# Patient Record
Sex: Male | Born: 1980
Health system: Southern US, Community
[De-identification: ages and names within clinical notes are randomized; demographics above are authoritative.]

## PROBLEM LIST (undated history)

## (undated) ENCOUNTER — Ambulatory Visit: Discharge status: Absent without leave | Payer: Self-pay

## (undated) DIAGNOSIS — E785 Hyperlipidemia, unspecified: Secondary | ICD-10-CM

## (undated) DIAGNOSIS — R079 Chest pain, unspecified: Secondary | ICD-10-CM

## (undated) DIAGNOSIS — E669 Obesity, unspecified: Secondary | ICD-10-CM

## (undated) HISTORY — PX: EYE SURGERY: SHX253

## (undated) HISTORY — PX: BACK SURGERY: SHX140

## (undated) HISTORY — DX: Chest pain, unspecified: R07.9

## (undated) HISTORY — DX: Obesity, unspecified: E66.9

## (undated) HISTORY — DX: Hyperlipidemia, unspecified: E78.5

---

## 2010-08-01 ENCOUNTER — Ambulatory Visit: Payer: Self-pay | Admitting: Internal Medicine

## 2012-05-27 ENCOUNTER — Ambulatory Visit: Payer: Self-pay

## 2012-07-07 ENCOUNTER — Ambulatory Visit: Payer: Self-pay

## 2014-08-15 DIAGNOSIS — K644 Residual hemorrhoidal skin tags: Secondary | ICD-10-CM | POA: Insufficient documentation

## 2014-08-15 DIAGNOSIS — R739 Hyperglycemia, unspecified: Secondary | ICD-10-CM | POA: Insufficient documentation

## 2014-08-15 DIAGNOSIS — R011 Cardiac murmur, unspecified: Secondary | ICD-10-CM | POA: Insufficient documentation

## 2014-08-15 DIAGNOSIS — K5900 Constipation, unspecified: Secondary | ICD-10-CM | POA: Insufficient documentation

## 2014-08-15 DIAGNOSIS — E349 Endocrine disorder, unspecified: Secondary | ICD-10-CM | POA: Insufficient documentation

## 2014-08-15 DIAGNOSIS — R799 Abnormal finding of blood chemistry, unspecified: Secondary | ICD-10-CM | POA: Insufficient documentation

## 2014-08-15 DIAGNOSIS — K219 Gastro-esophageal reflux disease without esophagitis: Secondary | ICD-10-CM | POA: Insufficient documentation

## 2014-08-15 DIAGNOSIS — E7800 Pure hypercholesterolemia, unspecified: Secondary | ICD-10-CM | POA: Insufficient documentation

## 2014-08-15 DIAGNOSIS — R002 Palpitations: Secondary | ICD-10-CM | POA: Insufficient documentation

## 2014-08-15 DIAGNOSIS — R7303 Prediabetes: Secondary | ICD-10-CM | POA: Insufficient documentation

## 2014-08-15 DIAGNOSIS — E559 Vitamin D deficiency, unspecified: Secondary | ICD-10-CM | POA: Insufficient documentation

## 2014-09-18 ENCOUNTER — Ambulatory Visit
Admission: EM | Admit: 2014-09-18 | Discharge: 2014-09-18 | Disposition: A | Discharge status: Home / Self Care | Payer: Worker's Compensation

## 2014-09-20 HISTORY — PX: LASIK: SHX215

## 2014-09-24 ENCOUNTER — Encounter: Payer: Self-pay | Admitting: Physician Assistant

## 2014-09-24 ENCOUNTER — Ambulatory Visit (INDEPENDENT_AMBULATORY_CARE_PROVIDER_SITE_OTHER): Payer: 59 | Admitting: Physician Assistant

## 2014-09-24 VITALS — BP 130/86 | HR 92 | Temp 98.4°F | Resp 16 | Ht 71.0 in | Wt 216.0 lb

## 2014-09-24 DIAGNOSIS — Z0000 Encounter for general adult medical examination without abnormal findings: Secondary | ICD-10-CM

## 2014-09-24 DIAGNOSIS — E291 Testicular hypofunction: Secondary | ICD-10-CM | POA: Diagnosis not present

## 2014-09-24 DIAGNOSIS — E349 Endocrine disorder, unspecified: Secondary | ICD-10-CM

## 2014-09-24 DIAGNOSIS — R0989 Other specified symptoms and signs involving the circulatory and respiratory systems: Secondary | ICD-10-CM

## 2014-09-24 NOTE — Patient Instructions (Signed)
Health Maintenance A healthy lifestyle and preventative care can promote health and wellness. 1. Maintain regular health, dental, and eye exams. 2. Eat a healthy diet. Foods like vegetables, fruits, whole grains, low-fat dairy products, and lean protein foods contain the nutrients you need and are low in calories. Decrease your intake of foods high in solid fats, added sugars, and salt. Get information about a proper diet from your health care provider, if necessary. 3. Regular physical exercise is one of the most important things you can do for your health. Most adults should get at least 150 minutes of moderate-intensity exercise (any activity that increases your heart rate and causes you to sweat) each week. In addition, most adults need muscle-strengthening exercises on 2 or more days a week.  4. Maintain a healthy weight. The body mass index (BMI) is a screening tool to identify possible weight problems. It provides an estimate of body fat based on height and weight. Your health care provider can find your BMI and can help you achieve or maintain a healthy weight. For males 20 years and older: 1. A BMI below 18.5 is considered underweight. 2. A BMI of 18.5 to 24.9 is normal. 3. A BMI of 25 to 29.9 is considered overweight. 4. A BMI of 30 and above is considered obese. 5. Maintain normal blood lipids and cholesterol by exercising and minimizing your intake of saturated fat. Eat a balanced diet with plenty of fruits and vegetables. Blood tests for lipids and cholesterol should begin at age 32 and be repeated every 5 years. If your lipid or cholesterol levels are high, you are over age 35, or you are at high risk for heart disease, you may need your cholesterol levels checked more frequently.Ongoing high lipid and cholesterol levels should be treated with medicines if diet and exercise are not working. 6. If you smoke, find out from your health care provider how to quit. If you do not use tobacco,  do not start. 7. Lung cancer screening is recommended for adults aged 55-80 years who are at high risk for developing lung cancer because of a history of smoking. A yearly low-dose CT scan of the lungs is recommended for people who have at least a 30-pack-year history of smoking and are current smokers or have quit within the past 15 years. A pack year of smoking is smoking an average of 1 pack of cigarettes a day for 1 year (for example, a 30-pack-year history of smoking could mean smoking 1 pack a day for 30 years or 2 packs a day for 15 years). Yearly screening should continue until the smoker has stopped smoking for at least 15 years. Yearly screening should be stopped for people who develop a health problem that would prevent them from having lung cancer treatment. 8. If you choose to drink alcohol, do not have more than 2 drinks per day. One drink is considered to be 12 oz (360 mL) of beer, 5 oz (150 mL) of wine, or 1.5 oz (45 mL) of liquor. 9. Avoid the use of street drugs. Do not share needles with anyone. Ask for help if you need support or instructions about stopping the use of drugs. 10. High blood pressure causes heart disease and increases the risk of stroke. Blood pressure should be checked at least every 1-2 years. Ongoing high blood pressure should be treated with medicines if weight loss and exercise are not effective. 11. If you are 94-12 years old, ask your health care provider if  you should take aspirin to prevent heart disease. 12. Diabetes screening involves taking a blood sample to check your fasting blood sugar level. This should be done once every 3 years after age 18 if you are at a normal weight and without risk factors for diabetes. Testing should be considered at a younger age or be carried out more frequently if you are overweight and have at least 1 risk factor for diabetes. 13. Colorectal cancer can be detected and often prevented. Most routine colorectal cancer screening  begins at the age of 64 and continues through age 18. However, your health care provider may recommend screening at an earlier age if you have risk factors for colon cancer. On a yearly basis, your health care provider may provide home test kits to check for hidden blood in the stool. A small camera at the end of a tube may be used to directly examine the colon (sigmoidoscopy or colonoscopy) to detect the earliest forms of colorectal cancer. Talk to your health care provider about this at age 72 when routine screening begins. A direct exam of the colon should be repeated every 5-10 years through age 12, unless early forms of precancerous polyps or small growths are found. 14. People who are at an increased risk for hepatitis B should be screened for this virus. You are considered at high risk for hepatitis B if: 1. You were born in a country where hepatitis B occurs often. Talk with your health care provider about which countries are considered high risk. 2. Your parents were born in a high-risk country and you have not received a shot to protect against hepatitis B (hepatitis B vaccine). 3. You have HIV or AIDS. 4. You use needles to inject street drugs. 5. You live with, or have sex with, someone who has hepatitis B. 6. You are a man who has sex with other men (MSM). 7. You get hemodialysis treatment. 8. You take certain medicines for conditions like cancer, organ transplantation, and autoimmune conditions. 15. Hepatitis C blood testing is recommended for all people born from [birth year] through 1964 and any individual with known risk factors for hepatitis C. 16. Healthy men should no longer receive prostate-specific antigen (PSA) blood tests as part of routine cancer screening. Talk to your health care provider about prostate cancer screening. 17. Testicular cancer screening is not recommended for adolescents or adult males who have no symptoms. Screening includes self-exam, a health care provider exam, and  other screening tests. Consult with your health care provider about any symptoms you have or any concerns you have about testicular cancer. 18. Practice safe sex. Use condoms and avoid high-risk sexual practices to reduce the spread of sexually transmitted infections (STIs). 19. You should be screened for STIs, including gonorrhea and chlamydia if: 1. You are sexually active and are younger than 24 years. 2. You are older than 24 years, and your health care provider tells you that you are at risk for this type of infection. 3. Your sexual activity has changed since you were last screened, and you are at an increased risk for chlamydia or gonorrhea. Ask your health care provider if you are at risk. 20. If you are at risk of being infected with HIV, it is recommended that you take a prescription medicine daily to prevent HIV infection. This is called pre-exposure prophylaxis (PrEP). You are considered at risk if: 1. You are a man who has sex with other men (MSM). 2. You are a heterosexual man who  is sexually active with multiple partners. 3. You take drugs by injection. 4. You are sexually active with a partner who has HIV. 5. Talk with your health care provider about whether you are at high risk of being infected with HIV. If you choose to begin PrEP, you should first be tested for HIV. You should then be tested every 3 months for as long as you are taking PrEP. 21. Use sunscreen. Apply sunscreen liberally and repeatedly throughout the day. You should seek shade when your shadow is shorter than you. Protect yourself by wearing long sleeves, pants, a wide-brimmed hat, and sunglasses year round whenever you are outdoors. 22. Tell your health care provider of new moles or changes in moles, especially if there is a change in shape or color. Also, tell your health care provider if a mole is larger than the size of a pencil eraser. 23. A one-time screening for abdominal aortic aneurysm (AAA) and surgical  repair of large AAAs by ultrasound is recommended for men aged 65-75 years who are current or former smokers. 24. Stay current with your vaccines (immunizations). Document Released: 08/15/2007 Document Revised: 02/21/2013 Document Reviewed: 07/14/2010 [facility] Patient Information 2015 [place], [place]. This information is not intended to replace advice given to you by your health care provider. Make sure you discuss any questions you have with your health care provider.     Why follow it? Research shows. . Those who follow the Mediterranean diet have a reduced risk of heart disease  . The diet is associated with a reduced incidence of Parkinson's and Alzheimer's diseases . People following the diet may have longer life expectancies and lower rates of chronic diseases  . The Dietary Guidelines for Americans recommends the Mediterranean diet as an eating plan to promote health and prevent disease  What Is the Mediterranean Diet?  . Healthy eating plan based on typical foods and recipes of Mediterranean-style cooking . The diet is primarily a plant based diet; these foods should make up a majority of meals   Starches - Plant based foods should make up a majority of meals - They are an important sources of vitamins, minerals, energy, antioxidants, and fiber - Choose whole grains, foods high in fiber and minimally processed items  - Typical grain sources include wheat, oats, barley, corn, brown rice, bulgar, farro, millet, polenta, couscous  - Various types of beans include chickpeas, lentils, fava beans, black beans, white beans   Fruits  Veggies - Large quantities of antioxidant rich fruits & veggies; 6 or more servings  - Vegetables can be eaten raw or lightly drizzled with oil and cooked  - Vegetables common to the traditional Mediterranean Diet include: artichokes, arugula, beets, broccoli, brussel sprouts, cabbage, carrots, celery, collard greens, cucumbers, eggplant, kale, leeks, lemons,  lettuce, mushrooms, okra, onions, peas, peppers, potatoes, pumpkin, radishes, rutabaga, shallots, spinach, sweet potatoes, turnips, zucchini - Fruits common to the Mediterranean Diet include: apples, apricots, avocados, cherries, clementines, dates, figs, grapefruits, grapes, melons, nectarines, oranges, peaches, pears, pomegranates, strawberries, tangerines  Fats - Replace butter and margarine with healthy oils, such as olive oil, canola oil, and tahini  - Limit nuts to no more than a handful a day  - Nuts include walnuts, almonds, pecans, pistachios, pine nuts  - Limit or avoid candied, honey roasted or heavily salted nuts - Olives are central to the Mediterranean diet - can be eaten whole or used in a variety of dishes   Meats Protein - Limiting red meat: no more  than a few times a month - When eating red meat: choose lean cuts and keep the portion to the size of deck of cards - Eggs: approx. 0 to 4 times a week  - Fish and lean poultry: at least 2 a week  - Healthy protein sources include, chicken, [place], lean beef, lamb - Increase intake of seafood such as tuna, salmon, trout, mackerel, shrimp, scallops - Avoid or limit high fat processed meats such as sausage and bacon  Dairy - Include moderate amounts of low fat dairy products  - Focus on healthy dairy such as fat free yogurt, skim milk, low or reduced fat cheese - Limit dairy products higher in fat such as whole or 2% milk, cheese, ice cream  Alcohol - Moderate amounts of red wine is ok  - No more than 5 oz daily for women (all ages) and men older than age 35  - No more than 10 oz of wine daily for men younger than 83  Other - Limit sweets and other desserts  - Use herbs and spices instead of salt to flavor foods  - Herbs and spices common to the traditional Mediterranean Diet include: basil, bay leaves, chives, cloves, cumin, fennel, garlic, lavender, marjoram, mint, oregano, parsley, pepper, rosemary, sage, savory, sumac, tarragon,  thyme   It's not just a diet, it's a lifestyle:  . The Mediterranean diet includes lifestyle factors typical of those in the region  . Foods, drinks and meals are best eaten with others and savored . Daily physical activity is important for overall good health . This could be strenuous exercise like running and aerobics . This could also be more leisurely activities such as walking, housework, yard-work, or taking the stairs . Moderation is the key; a balanced and healthy diet accommodates most foods and drinks . Consider portion sizes and frequency of consumption of certain foods   Meal Ideas & Options:  . Breakfast:  o Whole wheat toast or whole wheat English muffins with peanut butter & hard boiled egg o Steel cut oats topped with apples & cinnamon and skim milk  o Fresh fruit: banana, strawberries, melon, berries, peaches  o Smoothies: strawberries, bananas, greek yogurt, peanut butter o Low fat greek yogurt with blueberries and granola  o Egg white omelet with spinach and mushrooms o Breakfast couscous: whole wheat couscous, apricots, skim milk, cranberries  . Sandwiches:  o Hummus and grilled vegetables (peppers, zucchini, squash) on whole wheat bread   o Grilled chicken on whole wheat pita with lettuce, tomatoes, cucumbers or tzatziki  o Tuna salad on whole wheat bread: tuna salad made with greek yogurt, olives, red peppers, capers, green onions o Garlic rosemary lamb pita: lamb sauted with garlic, rosemary, salt & pepper; add lettuce, cucumber, greek yogurt to pita - flavor with lemon juice and black pepper  . Seafood:  o Mediterranean grilled salmon, seasoned with garlic, basil, parsley, lemon juice and black pepper o Shrimp, lemon, and spinach whole-grain pasta salad made with low fat greek yogurt  o Seared scallops with lemon orzo  o Seared tuna steaks seasoned salt, pepper, coriander topped with tomato mixture of olives, tomatoes, olive oil, minced garlic, parsley, green  onions and cappers  . Meats:  o Herbed greek chicken salad with kalamata olives, cucumber, feta  o Red bell peppers stuffed with spinach, bulgur, lean ground beef (or lentils) & topped with feta   o Kebabs: skewers of chicken, tomatoes, onions, zucchini, squash  o [place] burgers: made with  red onions, mint, dill, lemon juice, feta cheese topped with roasted red peppers . Vegetarian o Cucumber salad: cucumbers, artichoke hearts, celery, red onion, feta cheese, tossed in olive oil & lemon juice  o Hummus and whole grain pita points with a greek salad (lettuce, tomato, feta, olives, cucumbers, red onion) o Lentil soup with celery, carrots made with vegetable broth, garlic, salt and pepper  o Tabouli salad: parsley, bulgur, mint, scallions, cucumbers, tomato, radishes, lemon juice, olive oil, salt and pepper.      American Heart Association (AHA) Exercise Recommendation  Being physically active is important to prevent heart disease and stroke, the nation's No. 1and No. 5killers. To improve overall cardiovascular health, we suggest at least 150 minutes per week of moderate exercise or 75 minutes per week of vigorous exercise (or a combination of moderate and vigorous activity). Thirty minutes a day, five times a week is an easy goal to remember. You will also experience benefits even if you divide your time into two or three segments of 10 to 15 minutes per day.  For people who would benefit from lowering their blood pressure or cholesterol, we recommend 40 minutes of aerobic exercise of moderate to vigorous intensity three to four times a week to lower the risk for heart attack and stroke.  Physical activity is anything that makes you move your body and burn calories.  This includes things like climbing stairs or playing sports. Aerobic exercises benefit your heart, and include walking, jogging, swimming or biking. Strength and stretching exercises are best for overall stamina and flexibility.  The  simplest, positive change you can make to effectively improve your heart health is to start walking. It's enjoyable, free, easy, social and great exercise. A walking program is flexible and boasts high success rates because people can stick with it. It's easy for walking to become a regular and satisfying part of life.   For Overall Cardiovascular Health:  At least 30 minutes of moderate-intensity aerobic activity at least 5 days per week for a total of 150  OR   At least 25 minutes of vigorous aerobic activity at least 3 days per week for a total of 75 minutes; or a combination of moderate- and vigorous-intensity aerobic activity  AND   Moderate- to high-intensity muscle-strengthening activity at least 2 days per week for additional health benefits.  For Lowering Blood Pressure and Cholesterol  An average 40 minutes of moderate- to vigorous-intensity aerobic activity 3 or 4 times per week  What if I can't make it to the time goal? Something is always better than nothing! And everyone has to start somewhere. Even if you've been sedentary for years, today is the day you can begin to make healthy changes in your life. If you don't think you'll make it for 30 or 40 minutes, set a reachable goal for today. You can work up toward your overall goal by increasing your time as you get stronger. Don't let all-or-nothing thinking rob you of doing what you can every day.  Source:http://www.heart.org

## 2014-09-24 NOTE — Progress Notes (Signed)
Patient: Eric Lane, Male    DOB: Mar 03, 1980, 34 y.o.   MRN: [id number] Visit Date: 09/24/2014  Today's Provider: Margaretann Loveless, PA-C   Chief Complaint  Patient presents with  . Annual Exam  . Hypogonadism   Subjective:    Annual physical exam Eric Lane is a 34 y.o. male who presents today for health maintenance and complete physical. He feels well. 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 Pt's Last CPE- 09/20/2013 Last Tdap- 2015 before the birth of his son at [facility] in [place], [place] ----------------------------------------------------------------- Hypogonadism Pt reports he D/C his Testosterone about 5 months ago, and is requesting to be checked for any residual abnormalities from D/C the testosterone.  He is followed by Dr. Sammuel Bailiff for this and she is to follow up with him and recheck testosterone levels in October 2016.  He has had a complete workup for a cause including head CT to evaluate pituitary gland for possible tumor, which was negative.  He feels he is doing ok without the testosterone injection and has not had any worsening of his fatigue or decrease in libido.  He also reports he had some blood work taken with work and they told him his cholesterol, glucose, and blood counts were all ok and WNL.  Review of Systems  All other systems reviewed and are negative.   Social History He  reports that he quit smoking about 9 years ago. His smoking use included Cigarettes. He has a 5 pack-year smoking history. His smokeless tobacco use includes Chew. He reports that he drinks alcohol. He reports that he does not use illicit drugs.  Patient Active Problem List   Diagnosis Date Noted  . Abnormal blood chemistry 08/15/2014  . CN (constipation) 08/15/2014  . Elevated blood sugar 08/15/2014  . Brash 08/15/2014  . External hemorrhoid 08/15/2014  . Hypercholesteremia 08/15/2014  . Hypotestosteronism 08/15/2014  .  Cardiac murmur 08/15/2014  . Awareness of heartbeats 08/15/2014  . Borderline diabetes 08/15/2014  . Avitaminosis D 08/15/2014    Past Surgical History  Procedure Laterality Date  . Lasik Bilateral 09/20/2014    Family History His family history includes Alzheimer's disease in his paternal grandmother; COPD in his maternal grandmother; Cancer in his maternal grandfather; Congestive Heart Failure in his maternal grandmother; Diabetes in his maternal grandmother; Hyperlipidemia in his father; Hypertension in his mother; Stomach cancer in his paternal grandfather.    No Known Allergies  Previous Medications   MOMETASONE-FORMOTEROL (DULERA) 200-5 MCG/ACT AERO    Inhale 2 puffs into the lungs 2 (two) times daily.   RANITIDINE (ZANTAC) 150 MG TABLET    Take 2 tablets by mouth as needed.    Patient Care Team: Margaretann Loveless, PA-C as PCP - General (Physician Assistant)     Objective:   Vitals: BP 130/86 mmHg  Pulse 92  Temp(Src) 98.4 F (36.9 C) (Oral)  Resp 16  Ht  (1.803 m)  Wt 216 lb (97.977 kg)  BMI 30.14 kg/m2   Physical Exam  Constitutional: He is oriented to person, place, and time. He appears well-developed and well-nourished.  HENT:  Head: Normocephalic and atraumatic.  Right Ear: Hearing, tympanic membrane, external ear and ear canal normal.  Left Ear: Hearing, tympanic membrane, external ear and ear canal normal.  Nose: Nose normal.  Mouth/Throat: Uvula is midline, oropharynx is clear and moist and mucous membranes are normal. No oropharyngeal exudate.  Eyes:  Conjunctivae and EOM are normal. Pupils are equal, round, and reactive to light. Right eye exhibits no discharge.  Neck: Normal range of motion. Neck supple. Carotid bruit is not present. No tracheal deviation present. No thyromegaly present.  Cardiovascular: Normal rate, regular rhythm, normal heart sounds and intact distal pulses.  Exam reveals no gallop and no friction rub.   No murmur  heard. Pulmonary/Chest: Effort normal and breath sounds normal. No respiratory distress. He has no wheezes. He has no rales. He exhibits no tenderness.  Abdominal: Soft. Bowel sounds are normal. He exhibits abdominal bruit and pulsatile midline mass. He exhibits no distension and no mass. There is no hepatosplenomegaly. There is no tenderness. There is no rebound, no guarding and no CVA tenderness. No hernia.  Musculoskeletal: Normal range of motion. He exhibits no edema or tenderness.  Lymphadenopathy:    He has no cervical adenopathy.  Neurological: He is alert and oriented to person, place, and time. He has normal reflexes. No cranial nerve deficit. He exhibits normal muscle tone. Coordination normal.  Skin: Skin is warm and dry. No rash noted. No erythema.  Psychiatric: He has a normal mood and affect. His behavior is normal. Judgment and thought content normal.  Vitals reviewed.    Depression Screen No flowsheet data found.    Assessment & Plan:     Routine Health Maintenance and Physical Exam  Exercise Activities and Dietary recommendations Goals    None       There is no immunization history on file for this patient.  Health Maintenance  Topic Date Due  . HIV Screening  02/11/1997  . TETANUS/TDAP  02/11/2001  . INFLUENZA VACCINE  10/01/2014      Discussed health benefits of physical activity, and encouraged him to engage in regular exercise appropriate for his age and condition.   1. Routine general medical examination at a health care facility Labs recently done with work and were reported normal.    2. Prominent abdominal aortic pulsation No family history of AAA.  Will check aortic US to make sure no AAA. - US Aorta; Future  3. Hypotestosteronism Currently not on testosterone supplement.  Followed by Dr. Sammuel Bailiff.  She is to recheck testosterone in October 2016.    --------------------------------------------------------------------

## 2014-09-25 ENCOUNTER — Telehealth: Payer: Self-pay | Admitting: Physician Assistant

## 2014-09-25 NOTE — Telephone Encounter (Signed)
This has been changed.  Thank you! -JB

## 2014-09-25 NOTE — Addendum Note (Signed)
Addended by: Margaretann Loveless on: 09/25/2014 08:51 AM   Modules accepted: Orders

## 2014-09-25 NOTE — Telephone Encounter (Signed)
Per [facility] Ultrasound of Aorta needs to be changed to  Ultrasound Retroperitoneal complete [id number] for diagnosis given,Thanks

## 2014-09-28 ENCOUNTER — Ambulatory Visit
Admission: RE | Admit: 2014-09-28 | Discharge: 2014-09-28 | Disposition: A | Discharge status: Home / Self Care | Payer: 59 | Source: Ambulatory Visit | Attending: Physician Assistant | Admitting: Physician Assistant

## 2014-09-28 DIAGNOSIS — R0989 Other specified symptoms and signs involving the circulatory and respiratory systems: Secondary | ICD-10-CM | POA: Diagnosis not present

## 2014-12-07 ENCOUNTER — Encounter: Payer: Self-pay | Admitting: Physician Assistant

## 2014-12-07 ENCOUNTER — Ambulatory Visit (INDEPENDENT_AMBULATORY_CARE_PROVIDER_SITE_OTHER): Payer: 59 | Admitting: Physician Assistant

## 2014-12-07 VITALS — BP 118/80 | HR 68 | Temp 97.6°F | Resp 16 | Wt 210.4 lb

## 2014-12-07 DIAGNOSIS — R059 Cough, unspecified: Secondary | ICD-10-CM

## 2014-12-07 DIAGNOSIS — R05 Cough: Secondary | ICD-10-CM

## 2014-12-07 DIAGNOSIS — J0100 Acute maxillary sinusitis, unspecified: Secondary | ICD-10-CM | POA: Diagnosis not present

## 2014-12-07 MED ORDER — AMOXICILLIN-POT CLAVULANATE 875-125 MG PO TABS: 1.0000 | ORAL_TABLET | Freq: Two times a day (BID) | ORAL | Status: DC

## 2014-12-07 MED ORDER — BENZONATATE 100 MG PO CAPS: 100.0000 mg | ORAL_CAPSULE | Freq: Three times a day (TID) | ORAL | Status: DC | PRN

## 2014-12-07 NOTE — Progress Notes (Signed)
Patient: Eric Lane Male    DOB: 13-Jun-1980   34 y.o.   MRN: [id number] Visit Date: 12/07/2014  Today's Provider: Margaretann Loveless, PA-C   Chief Complaint  Patient presents with  . Cough   Subjective:    Cough This is a new problem. The current episode started more than 1 month ago. The problem has been gradually worsening. The problem occurs every few hours. The cough is productive of sputum (yellowish and greenisg pleghm). Associated symptoms include postnasal drip. Pertinent negatives include no chest pain, ear congestion, fever, headaches, nasal congestion, rhinorrhea, sore throat, shortness of breath or wheezing. Exacerbated by: Worst at night. He has tried OTC cough suppressant (allergy medicines) for the symptoms. The treatment provided no relief.  He has been trying Flonase, Benadryl, DayQuil and NyQuil and his Dulera inhaler. He has not had much relief from this. He states that the symptoms initially started near the end of August while he was away at a bachelor party. He called the doctor on call with his employer and was told that it was most likely viral in nature. He did symptomatic treatment at that time and symptoms improved. He then states that around mid September the symptoms came back and were worse this time. He states that he started developing the postnasal drainage which started the cough. He is now coughing up thick yellowish-green mucus. Cough is most bothersome when he initially lays down at night. This is not inhibiting his sleep however.     No Known Allergies Previous Medications   FLUTICASONE (FLONASE) 50 MCG/ACT NASAL SPRAY    Place 2 sprays into both nostrils daily.   MOMETASONE-FORMOTEROL (DULERA) 200-5 MCG/ACT AERO    Inhale 2 puffs into the lungs 2 (two) times daily.   RANITIDINE (ZANTAC) 150 MG TABLET    Take 2 tablets by mouth as needed.    Review of Systems  Constitutional: Negative for fever.  HENT: Positive for congestion and postnasal  drip. Negative for rhinorrhea, sinus pressure, sneezing and sore throat.   Eyes: Negative for photophobia, itching and visual disturbance.  Respiratory: Positive for cough. Negative for chest tightness, shortness of breath and wheezing.   Cardiovascular: Negative for chest pain and palpitations.  Neurological: Negative for headaches.    Social History  Substance Use Topics  . Smoking status: Former Smoker -- 1.00 packs/day for 5 years    Types: Cigarettes    Quit date: 03/01/2005  . Smokeless tobacco: Current User    Types: Chew  . Alcohol Use: 0.0 oz/week    0 Standard drinks or equivalent per week     Comment: OCCASIONALLY   Objective:   BP 118/80 mmHg  Pulse 68  Temp(Src) 97.6 F (36.4 C) (Oral)  Resp 16  Wt 210 lb 6.4 oz (95.437 kg)  SpO2 100%  Physical Exam  Constitutional: He appears well-developed and well-nourished. No distress.  HENT:  Head: Normocephalic and atraumatic.  Right Ear: Hearing, tympanic membrane, external ear and ear canal normal.  Left Ear: Hearing, tympanic membrane, external ear and ear canal normal.  Nose: Mucosal edema and rhinorrhea present. Right sinus exhibits maxillary sinus tenderness. Right sinus exhibits no frontal sinus tenderness. Left sinus exhibits maxillary sinus tenderness. Left sinus exhibits no frontal sinus tenderness.  Mouth/Throat: Uvula is midline, oropharynx is clear and moist and mucous membranes are normal. No oropharyngeal exudate.  Eyes: Conjunctivae and EOM are normal. Pupils are equal, round, and reactive to light. Right eye exhibits  no discharge. Left eye exhibits no discharge.  Neck: Normal range of motion. Neck supple. No JVD present. No tracheal deviation present. No Brudzinski's sign and no Kernig's sign noted. No thyromegaly present.  Cardiovascular: Normal rate, regular rhythm and normal heart sounds.  Exam reveals no gallop and no friction rub.   No murmur heard. Pulmonary/Chest: Effort normal and breath sounds  normal. No stridor. No respiratory distress. He has no wheezes. He has no rales. He exhibits no tenderness.  Lymphadenopathy:    He has no cervical adenopathy.  Skin: Skin is warm and dry. He is not diaphoretic.  Vitals reviewed.       Assessment & Plan:     1. Acute maxillary sinusitis, recurrence not specified  Worsening since September. Failed over-the-counter symptomatic treatments. I will treat with Augmentin as below. He is to call the office if symptoms fail to improve or worsen. - amoxicillin-clavulanate (AUGMENTIN) 875-125 MG tablet; Take 1 tablet by mouth 2 (two) times daily.  Dispense: 14 tablet; Refill: 0  2. Cough  I will treat the cough with Tessalon Perles as below. He did have this medication before and it worked well. I will avoid narcotic cough medication at this time due to the fact that he is not having any difficulties with sleep. He is to call the office if symptoms fail to improve or persist. - benzonatate (TESSALON PERLES) 100 MG capsule; Take 1 capsule (100 mg total) by mouth 3 (three) times daily as needed for cough.  Dispense: 20 capsule; Refill: 0       Margaretann Loveless, PA-C  [facility]

## 2014-12-07 NOTE — Patient Instructions (Signed)
Cough, Adult Coughing is a reflex that clears your throat and your airways. Coughing helps to heal and protect your lungs. It is normal to cough occasionally, but a cough that happens with other symptoms or lasts a long time may be a sign of a condition that needs treatment. A cough may last only 2-3 weeks (acute), or it may last longer than 8 weeks (chronic). CAUSES Coughing is commonly caused by:  Breathing in substances that irritate your lungs.  A viral or bacterial respiratory infection.  Allergies.  Asthma.  Postnasal drip.  Smoking.  Acid backing up from the stomach into the esophagus (gastroesophageal reflux).  Certain medicines.  Chronic lung problems, including COPD (or rarely, lung cancer).  Other medical conditions such as heart failure. HOME CARE INSTRUCTIONS  Pay attention to any changes in your symptoms. Take these actions to help with your discomfort:  Take medicines only as told by your health care provider.  If you were prescribed an antibiotic medicine, take it as told by your health care provider. Do not stop taking the antibiotic even if you start to feel better.  Talk with your health care provider before you take a cough suppressant medicine.  Drink enough fluid to keep your urine clear or pale yellow.  If the air is dry, use a cold steam vaporizer or humidifier in your bedroom or your home to help loosen secretions.  Avoid anything that causes you to cough at work or at home.  If your cough is worse at night, try sleeping in a semi-upright position.  Avoid cigarette smoke. If you smoke, quit smoking. If you need help quitting, ask your health care provider.  Avoid caffeine.  Avoid alcohol.  Rest as needed. SEEK MEDICAL CARE IF:   You have new symptoms.  You cough up pus.  Your cough does not get better after 2-3 weeks, or your cough gets worse.  You cannot control your cough with suppressant medicines and you are losing sleep.  You  develop pain that is getting worse or pain that is not controlled with pain medicines.  You have a fever.  You have unexplained weight loss.  You have night sweats. SEEK IMMEDIATE MEDICAL CARE IF:  You cough up blood.  You have difficulty breathing.  Your heartbeat is very fast.   This information is not intended to replace advice given to you by your health care provider. Make sure you discuss any questions you have with your health care provider.   Document Released: 08/15/2010 Document Revised: 11/07/2014 Document Reviewed: 04/25/2014 Elsevier Interactive Patient Education 2016 Elsevier Inc.  Benzonatate capsules What is this medicine? BENZONATATE (ben ZOE na tate) is used to treat cough. This medicine may be used for other purposes; ask your health care provider or pharmacist if you have questions. What should I tell my health care provider before I take this medicine? They need to know if you have any of these conditions: -kidney or liver disease -an unusual or allergic reaction to benzonatate, anesthetics, other medicines, foods, dyes, or preservatives -pregnant or trying to get pregnant -breast-feeding How should I use this medicine? Take this medicine by mouth with a glass of water. Follow the directions on the prescription label. Avoid breaking, chewing, or sucking the capsule, as this can cause serious side effects. Take your medicine at regular intervals. Do not take your medicine more often than directed. Talk to your pediatrician regarding the use of this medicine in children. While this drug may be prescribed for  children as young as 40 years old for selected conditions, precautions do apply. Overdosage: If you think you have taken too much of this medicine contact a poison control center or emergency room at once. NOTE: This medicine is only for you. Do not share this medicine with others. What if I miss a dose? If you miss a dose, take it as soon as you can. If it  is almost time for your next dose, take only that dose. Do not take double or extra doses. What may interact with this medicine? Do not take this medicine with any of the following medications: -MAOIs like Carbex, Eldepryl, Marplan, Nardil, and Parnate This list may not describe all possible interactions. Give your health care provider a list of all the medicines, herbs, non-prescription drugs, or dietary supplements you use. Also tell them if you smoke, drink alcohol, or use illegal drugs. Some items may interact with your medicine. What should I watch for while using this medicine? Tell your doctor if your symptoms do not improve or if they get worse. If you have a high fever, skin rash, or headache, see your health care professional. You may get drowsy or dizzy. Do not drive, use machinery, or do anything that needs mental alertness until you know how this medicine affects you. Do not sit or stand up quickly, especially if you are an older patient. This reduces the risk of dizzy or fainting spells. What side effects may I notice from receiving this medicine? Side effects that you should report to your doctor or health care professional as soon as possible: -allergic reactions like skin rash, itching or hives, swelling of the face, lips, or tongue -breathing problems -chest pain -confusion or hallucinations -irregular heartbeat -numbness of mouth or throat -seizures Side effects that usually do not require medical attention (report to your doctor or health care professional if they continue or are bothersome): -burning feeling in the eyes -constipation -headache -nasal congestion -stomach upset This list may not describe all possible side effects. Call your doctor for medical advice about side effects. You may report side effects to FDA at 1-800-FDA-1088. Where should I keep my medicine? Keep out of the reach of children. Store at room temperature between 15 and 30 degrees C (59 and 86  degrees F). Keep tightly closed. Protect from light and moisture. Throw away any unused medicine after the expiration date. NOTE: This sheet is a summary. It may not cover all possible information. If you have questions about this medicine, talk to your doctor, pharmacist, or health care provider.    2016, Elsevier/Gold Standard. (2007-05-18 14:52:56)

## 2015-03-06 ENCOUNTER — Encounter: Payer: Self-pay | Admitting: Physician Assistant

## 2015-03-06 ENCOUNTER — Ambulatory Visit (INDEPENDENT_AMBULATORY_CARE_PROVIDER_SITE_OTHER): Payer: 59 | Admitting: Physician Assistant

## 2015-03-06 VITALS — BP 120/80 | HR 74 | Temp 98.5°F | Resp 16 | Wt 220.0 lb

## 2015-03-06 DIAGNOSIS — J0100 Acute maxillary sinusitis, unspecified: Secondary | ICD-10-CM

## 2015-03-06 MED ORDER — AMOXICILLIN-POT CLAVULANATE 875-125 MG PO TABS: 1.0000 | ORAL_TABLET | Freq: Two times a day (BID) | ORAL | Status: DC

## 2015-03-06 NOTE — Patient Instructions (Signed)

## 2015-03-06 NOTE — Progress Notes (Signed)
Patient: Eric Lane Male    DOB: Jul 05, 1980   35 y.o.   MRN: [id number] Visit Date: 03/06/2015  Today's Provider: Margaretann Loveless, PA-C   Chief Complaint  Patient presents with  . Sinusitis   Subjective:    Sinusitis This is a new problem. The current episode started 1 to 4 weeks ago. The problem has been gradually worsening since onset. There has been no fever. Associated symptoms include chills, congestion, coughing, headaches, sinus pressure, sneezing and a sore throat. Pertinent negatives include no diaphoresis, ear pain or shortness of breath. Past treatments include oral decongestants. The treatment provided no relief.       No Known Allergies Previous Medications   FLUTICASONE (FLONASE) 50 MCG/ACT NASAL SPRAY    Place 2 sprays into both nostrils daily.   MOMETASONE-FORMOTEROL (DULERA) 200-5 MCG/ACT AERO    Inhale 2 puffs into the lungs 2 (two) times daily.   RANITIDINE (ZANTAC) 150 MG TABLET    Take 2 tablets by mouth as needed.    Review of Systems  Constitutional: Positive for chills. Negative for fever, diaphoresis and fatigue.  HENT: Positive for congestion, postnasal drip, sinus pressure, sneezing and sore throat. Negative for ear pain, rhinorrhea, tinnitus, trouble swallowing and voice change.   Eyes: Negative.   Respiratory: Positive for cough. Negative for chest tightness, shortness of breath and wheezing.   Cardiovascular: Negative for chest pain.  Gastrointestinal: Negative for nausea, vomiting and abdominal pain.  Neurological: Positive for headaches. Negative for dizziness.    Social History  Substance Use Topics  . Smoking status: Former Smoker -- 1.00 packs/day for 5 years    Types: Cigarettes    Quit date: 03/01/2005  . Smokeless tobacco: Current User    Types: Chew  . Alcohol Use: 0.0 oz/week    0 Standard drinks or equivalent per week     Comment: OCCASIONALLY   Objective:   BP 120/80 mmHg  Pulse 74  Temp(Src) 98.5 F (36.9 C)  (Oral)  Resp 16  Wt 220 lb (99.791 kg)  SpO2 96%  Physical Exam  Constitutional: He appears well-developed and well-nourished. No distress.  HENT:  Head: Normocephalic and atraumatic.  Right Ear: Hearing, tympanic membrane, external ear and ear canal normal. Tympanic membrane is not erythematous and not bulging. No middle ear effusion.  Left Ear: Hearing, tympanic membrane, external ear and ear canal normal. Tympanic membrane is not erythematous and not bulging.  No middle ear effusion.  Nose: Mucosal edema and rhinorrhea present. Right sinus exhibits maxillary sinus tenderness. Right sinus exhibits no frontal sinus tenderness. Left sinus exhibits maxillary sinus tenderness. Left sinus exhibits no frontal sinus tenderness.  Mouth/Throat: Uvula is midline, oropharynx is clear and moist and mucous membranes are normal. No oropharyngeal exudate, posterior oropharyngeal edema or posterior oropharyngeal erythema.  Eyes: Conjunctivae and EOM are normal. Pupils are equal, round, and reactive to light. Right eye exhibits no discharge. Left eye exhibits no discharge.  Neck: Normal range of motion. Neck supple. No tracheal deviation present. No Brudzinski's sign and no Kernig's sign noted. No thyromegaly present.  Cardiovascular: Normal rate, regular rhythm and normal heart sounds.  Exam reveals no gallop and no friction rub.   No murmur heard. Pulmonary/Chest: Effort normal and breath sounds normal. No stridor. No respiratory distress. He has no wheezes. He has no rales.  Lymphadenopathy:    He has no cervical adenopathy.  Skin: Skin is warm and dry. He is not diaphoretic.  Vitals  reviewed.       Assessment & Plan:     1. Acute maxillary sinusitis, recurrence not specified Worsening symptoms over one week without response to OTC treatments.  Will treat with augmentin as below. He may continue mucinex DM for congestion.  He needs to stay well hydrated and get plenty of rest.  He is to call the  office if symptoms fail to improve or worsen. - amoxicillin-clavulanate (AUGMENTIN) 875-125 MG tablet; Take 1 tablet by mouth 2 (two) times daily.  Dispense: 20 tablet; Refill: 0       Margaretann LovelessJennifer M Burnette, PA-C  [facility]

## 2015-10-03 ENCOUNTER — Encounter: Payer: Self-pay | Admitting: Family Medicine

## 2015-10-03 ENCOUNTER — Ambulatory Visit (INDEPENDENT_AMBULATORY_CARE_PROVIDER_SITE_OTHER): Payer: 59 | Admitting: Family Medicine

## 2015-10-03 VITALS — BP 132/80 | HR 64 | Temp 97.7°F | Resp 16 | Wt 230.0 lb

## 2015-10-03 DIAGNOSIS — T63891A Toxic effect of contact with other venomous animals, accidental (unintentional), initial encounter: Secondary | ICD-10-CM

## 2015-10-03 DIAGNOSIS — T63481A Toxic effect of venom of other arthropod, accidental (unintentional), initial encounter: Secondary | ICD-10-CM

## 2015-10-03 MED ORDER — TRIAMCINOLONE ACETONIDE 0.1 % EX CREA: 1.0000 "application " | TOPICAL_CREAM | Freq: Three times a day (TID) | CUTANEOUS | 0 refills | Status: DC

## 2015-10-03 NOTE — Progress Notes (Signed)
Subjective:     Patient ID: Eric Lane, male   DOB: February 22, 1981, 35 y.o.   MRN: [id number]  HPI  Chief Complaint  Patient presents with  . Insect Bite    Patient comes in office today with concerns of possible insect bite or sting that occured three days ago while riding bike. Patient states that when he was riding he saw insect with wings and assumed it was a butterfly, it landed on right side of his chest and he states he felt a sharp sting . Patient states that it felt like a mosquito bite the next day but now has grown larger in size and has a small black dot in the middle.  States it is starting to itch now. Has place abx ointment on it.   Review of Systems     Objective:   Physical Exam  Constitutional: He appears well-developed and well-nourished. No distress.  Skin:  Right upper chest with 4 cm wide area of erythema with 2 mm dark eschar. Attempt made to scrape off eschar with forceps but unsuccessful with small amount of blood but no pus.       Assessment:    1. Insect sting, accidental or unintentional, initial encounter - triamcinolone cream (KENALOG) 0.1 %; Apply 1 application topically 3 (three) times daily.  Dispense: 30 g; Refill: 0    Plan:    Discussed use of antihistamines and cold compresses.

## 2015-10-03 NOTE — Patient Instructions (Signed)
Try Claritin or similar during the day and Benadryl by mouth at night. Cold compresses may help. If area of redness not receding let me know.

## 2015-11-21 ENCOUNTER — Encounter: Payer: 59 | Admitting: Physician Assistant

## 2015-12-11 ENCOUNTER — Encounter: Payer: Self-pay | Admitting: Physician Assistant

## 2015-12-11 ENCOUNTER — Ambulatory Visit (INDEPENDENT_AMBULATORY_CARE_PROVIDER_SITE_OTHER): Payer: 59 | Admitting: Physician Assistant

## 2015-12-11 VITALS — BP 132/80 | HR 63 | Temp 97.6°F | Resp 16 | Ht 71.0 in | Wt 207.4 lb

## 2015-12-11 DIAGNOSIS — Z0189 Encounter for other specified special examinations: Secondary | ICD-10-CM

## 2015-12-11 DIAGNOSIS — Z23 Encounter for immunization: Secondary | ICD-10-CM | POA: Diagnosis not present

## 2015-12-11 DIAGNOSIS — Z008 Encounter for other general examination: Secondary | ICD-10-CM

## 2015-12-11 NOTE — Progress Notes (Signed)
Name: Eric Lane   MRN: [id number]    DOB: November 21, 1980   Date:12/11/2015       Progress Note  Subjective  Chief Complaint  Chief Complaint  Patient presents with  . Physical Abilities Test form    HPI Patient is here to get form fill out for work. 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   He currently exercises regularly with cycling, running and weight lifting. He denies any SOB or chest pain with exercise. There is no family or personal history of syncope or early/sudden cardiovascular death.   He does currently have an inhaler, Dulera, that he uses only prn. He states this is only when he may develop an URI or sinus infection. No known exercise-induced asthma. . History reviewed. No pertinent past medical history.  Past Surgical History:  Procedure Laterality Date  . LASIK Bilateral 09/20/2014    Family History  Problem Relation Age of Onset  . Hypertension Mother   . Hyperlipidemia Father   . Diabetes Maternal Grandmother   . COPD Maternal Grandmother   . Congestive Heart Failure Maternal Grandmother   . Cancer Maternal Grandfather   . Alzheimer's disease Paternal Grandmother   . Stomach cancer Paternal Grandfather     Social History   Social History  . Marital status: Married    Spouse name: Eric Lane  . Number of children: 1  . Years of education: 84   Occupational History  . Police [employer]   Social History Main Topics  . Smoking status: Former Smoker    Packs/day: 1.00    Years: 5.00    Types: Cigarettes    Quit date: 03/01/2005  . Smokeless tobacco: Current User    Types: Chew  . Alcohol use 0.0 oz/week     Comment: OCCASIONALLY  . Drug use: No  . Sexual activity: Not on file   Other Topics Concern  . Not on file   Social History Narrative  . No narrative on file     Current Outpatient Prescriptions:  .  fluticasone  (FLONASE) 50 MCG/ACT nasal spray, Place 2 sprays into both nostrils daily., Disp: , Rfl: 0 .  mometasone-formoterol (DULERA) 200-5 MCG/ACT AERO, Inhale 2 puffs into the lungs 2 (two) times daily., Disp: , Rfl:  .  ranitidine (ZANTAC) 150 MG tablet, Take 2 tablets by mouth as needed., Disp: , Rfl:  .  triamcinolone cream (KENALOG) 0.1 %, Apply 1 application topically 3 (three) times daily., Disp: 30 g, Rfl: 0  No Known Allergies   Review of Systems  Constitutional: Negative.   HENT: Negative.   Eyes: Negative.   Respiratory: Negative.   Cardiovascular: Negative.   Gastrointestinal: Negative.   Genitourinary: Negative.   Musculoskeletal: Negative.   Skin: Negative.   Neurological: Negative.   Endo/Heme/Allergies: Negative.   Psychiatric/Behavioral: Negative.     Objective  Vitals:   12/11/15 0843  BP: 132/80  Pulse: 63  Resp: 16  Temp: 97.6 F (36.4 C)  TempSrc: Oral  Weight: 207 lb 6.4 oz (94.1 kg)  Height: 5\' 11"  (1.803 m)    Physical Exam  Constitutional: He is oriented to person, place, and time and well-developed, well-nourished, and in no distress. No distress.  HENT:  Head: Normocephalic and atraumatic.  Right Ear: Hearing, tympanic membrane, external ear and ear canal normal.  Left Ear: Hearing, tympanic membrane, external ear and ear canal  normal.  Nose: Nose normal.  Mouth/Throat: Uvula is midline, oropharynx is clear and moist and mucous membranes are normal. No oropharyngeal exudate.  Eyes: Conjunctivae and EOM are normal. Pupils are equal, round, and reactive to light. Right eye exhibits no discharge. Left eye exhibits no discharge.  Neck: Normal range of motion. Neck supple.  Cardiovascular: Normal rate, regular rhythm, normal heart sounds and intact distal pulses.  Exam reveals no gallop and no friction rub.   No murmur heard. Pulmonary/Chest: Effort normal and breath sounds normal. No respiratory distress. He has no wheezes. He has no rales. He exhibits  no tenderness.  Abdominal: Soft. Bowel sounds are normal. He exhibits no distension and no mass. There is no tenderness. There is no rebound and no guarding.  Musculoskeletal: Normal range of motion.  Neurological: He is alert and oriented to person, place, and time.  Skin: Skin is warm and dry. He is not diaphoretic.  Psychiatric: Mood, memory, affect and judgment normal.  Vitals reviewed.   Assessment & Plan  Problem List Items Addressed This Visit    None    Visit Diagnoses    Encounter for biometric screening    -  Primary   Need for influenza vaccination       Relevant Orders   Flu Vaccine QUAD 36+ mos IM (Completed)     Form completed for patient. No known risks or reasons patient cannot participate in physical test.  No orders of the defined types were placed in this encounter.

## 2015-12-11 NOTE — Patient Instructions (Signed)

## 2015-12-23 ENCOUNTER — Telehealth: Payer: Self-pay | Admitting: Physician Assistant

## 2015-12-23 NOTE — Telephone Encounter (Signed)
Ok to print immunization record and he can pick up so he can have on file. Ok to also verbally give him the immunizations he has had

## 2015-12-23 NOTE — Telephone Encounter (Signed)
Please advise.  He got Tdap 11/22/2013 MMR:06/04/2000

## 2015-12-23 NOTE — Telephone Encounter (Signed)
Patient advised as directed below. Copy of immunization is placed upfront for him to pick up.  Thanks,  -Karess Harner

## 2015-12-23 NOTE — Telephone Encounter (Signed)
Pt. requesting a call back to see what immunizations  he has had in the past for a job pt is fixing to start.  Pt's CB# [phone]. Thanks CC

## 2016-01-09 ENCOUNTER — Encounter: Payer: Self-pay | Admitting: Physician Assistant

## 2016-01-09 ENCOUNTER — Ambulatory Visit (INDEPENDENT_AMBULATORY_CARE_PROVIDER_SITE_OTHER): Payer: 59 | Admitting: Physician Assistant

## 2016-01-09 VITALS — BP 128/82 | HR 76 | Temp 97.8°F | Resp 16 | Ht 71.0 in | Wt 206.8 lb

## 2016-01-09 DIAGNOSIS — E7800 Pure hypercholesterolemia, unspecified: Secondary | ICD-10-CM | POA: Diagnosis not present

## 2016-01-09 DIAGNOSIS — Z0000 Encounter for general adult medical examination without abnormal findings: Secondary | ICD-10-CM

## 2016-01-09 DIAGNOSIS — E349 Endocrine disorder, unspecified: Secondary | ICD-10-CM

## 2016-01-09 DIAGNOSIS — R739 Hyperglycemia, unspecified: Secondary | ICD-10-CM

## 2016-01-09 NOTE — Progress Notes (Signed)
Patient: Eric Lane, Male    DOB: [birth year], 35 y.o.   MRN: [id number] Visit Date: 01/09/2016  Today's Provider: Mar Daring, PA-C   No chief complaint on file.  Subjective:    Annual physical exam Eric Lane is a 35 y.o. male who presents today for health maintenance and complete physical. He feels well. He reports exercising 2x a week. He reports he is sleeping fairly well. Patient would like to address today concerns of veins near his scrotum and penis that have been more apparent over the past month. He is having no testicular swelling or soreness. 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   -----------------------------------------------------------------   Review of Systems  Constitutional: Negative.   HENT: Negative.   Eyes: Negative.   Respiratory: Negative.   Cardiovascular: Negative.   Gastrointestinal: Negative.   Endocrine: Negative.   Genitourinary: Negative.   Musculoskeletal: Negative.   Skin: Negative.   Allergic/Immunologic: Negative.   Neurological: Negative.   Hematological: Negative.   Psychiatric/Behavioral: Negative.     Social History He  reports that he quit smoking about 10 years ago. His smoking use included Cigarettes. He has a 5.00 pack-year smoking history. His smokeless tobacco use includes Chew. He reports that he drinks alcohol. He reports that he does not use drugs. Social History   Social History  . Marital status: Married    Spouse name: [name]  . Number of children: 1  . Years of education: 23   Occupational History  . Police Officer [employer] History Main Topics  . Smoking status: Former Smoker    Packs/day: 1.00    Years: 5.00    Types: Cigarettes    Quit date: 03/01/2005  . Smokeless tobacco: Current User    Types: Chew  . Alcohol use 0.0 oz/week     Comment: OCCASIONALLY  . Drug use: No  . Sexual activity: Not on file   Other Topics Concern  . Not  on file   Social History Narrative  . No narrative on file    Patient Active Problem List   Diagnosis Date Noted  . CN (constipation) 08/15/2014  . Elevated blood sugar 08/15/2014  . Brash 08/15/2014  . External hemorrhoid 08/15/2014  . Hypercholesteremia 08/15/2014  . Hypotestosteronism 08/15/2014  . Cardiac murmur 08/15/2014  . Awareness of heartbeats 08/15/2014  . Borderline diabetes 08/15/2014  . Avitaminosis D 08/15/2014    Past Surgical History:  Procedure Laterality Date  . LASIK Bilateral 09/20/2014    Family History  Family Status  Relation Status  . Mother Alive  . Father Alive  . Sister Alive  . Maternal Grandmother Alive  . Maternal Grandfather Deceased  . Paternal Grandmother Deceased  . Paternal Grandfather Deceased   His family history includes Alzheimer's disease in his paternal grandmother; COPD in his maternal grandmother; Cancer in his maternal grandfather; Congestive Heart Failure in his maternal grandmother; Diabetes in his maternal grandmother; Hyperlipidemia in his father; Hypertension in his mother; Stomach cancer in his paternal grandfather.     No Known Allergies  Previous Medications   FLUTICASONE (FLONASE) 50 MCG/ACT NASAL SPRAY    Place 2 sprays into both nostrils daily.   MOMETASONE-FORMOTEROL (DULERA) 200-5 MCG/ACT AERO    Inhale 2 puffs into the lungs 2 (two) times daily.   RANITIDINE (ZANTAC) 150 MG TABLET    Take 2 tablets by mouth as needed.   TRIAMCINOLONE CREAM (KENALOG)  0.1 %    Apply 1 application topically 3 (three) times daily.    Patient Care Team: Jennifer M Burnette, PA-C as PCP - General (Physician Assistant)      Objective:   Vitals: There were no vitals taken for this visit.   Physical Exam  Constitutional: He is oriented to person, place, and time. He appears well-developed and well-nourished.  HENT:  Head: Normocephalic and atraumatic.  Right Ear: External ear normal.  Left Ear: External ear normal.  Nose:  Nose normal.  Mouth/Throat: Oropharynx is clear and moist.  Eyes: Conjunctivae and EOM are normal. Pupils are equal, round, and reactive to light. Right eye exhibits no discharge.  Neck: Normal range of motion. Neck supple. No tracheal deviation present. No thyromegaly present.  Cardiovascular: Normal rate, regular rhythm, normal heart sounds and intact distal pulses.   No murmur heard. Pulmonary/Chest: Effort normal and breath sounds normal. No respiratory distress. He has no wheezes. He has no rales. He exhibits no tenderness.  Abdominal: Soft. He exhibits no distension and no mass. There is no tenderness. There is no rebound and no guarding. Hernia confirmed negative in the right inguinal area and confirmed negative in the left inguinal area.  Genitourinary: Testes normal and penis normal. Right testis shows no mass, no swelling and no tenderness. Right testis is descended. Left testis shows no mass, no swelling and no tenderness. Left testis is descended. No phimosis, paraphimosis, hypospadias, penile erythema or penile tenderness. No discharge found.  Musculoskeletal: Normal range of motion. He exhibits no edema or tenderness.  Lymphadenopathy:    He has no cervical adenopathy.       Right: No inguinal adenopathy present.       Left: No inguinal adenopathy present.  Neurological: He is alert and oriented to person, place, and time. He has normal reflexes. No cranial nerve deficit. He exhibits normal muscle tone. Coordination normal.  Skin: Skin is warm and dry. No rash noted. No erythema.  Psychiatric: He has a normal mood and affect. His behavior is normal. Judgment and thought content normal.     Depression Screen PHQ 2/9 Scores 09/24/2014  PHQ - 2 Score 0      Assessment & Plan:     Routine Health Maintenance and Physical Exam  Exercise Activities and Dietary recommendations Goals    None      Immunization History  Administered Date(s) Administered  . DTaP 10/10/1987    . IPV 10/10/1987  . Influenza,inj,Quad PF,36+ Mos 12/11/2015  . MMR 06/04/2000  . Td 11/22/2013  . Tdap 11/22/2013    Health Maintenance  Topic Date Due  . HIV Screening  02/11/1997  . TETANUS/TDAP  11/23/2023  . INFLUENZA VACCINE  Completed     Discussed health benefits of physical activity, and encouraged him to engage in regular exercise appropriate for his age and condition.    1. Annual physical exam Normal physical exam today. Will check labs as below and f/u pending lab results. If labs are stable and WNL he will not need to have these rechecked for one year at her next annual physical exam. He is to call the office in the meantime if he has any acute issue, questions or concerns. - Comprehensive metabolic panel - CBC with Differential - TSH  2. Elevated blood sugar Will check labs as below and f/u pending results. - Hemoglobin A1c  3. Hypercholesteremia Will check labs as below and f/u pending results. - Lipid panel  4. Hypotestosteronism Previously low and   required testosterone injections. Will check labs as below and f/u pending results. - Testosterone  --------------------------------------------------------------------  

## 2016-01-09 NOTE — Patient Instructions (Signed)

## 2016-01-13 DIAGNOSIS — E349 Endocrine disorder, unspecified: Secondary | ICD-10-CM | POA: Diagnosis not present

## 2016-01-13 DIAGNOSIS — Z0000 Encounter for general adult medical examination without abnormal findings: Secondary | ICD-10-CM | POA: Diagnosis not present

## 2016-01-13 DIAGNOSIS — R739 Hyperglycemia, unspecified: Secondary | ICD-10-CM | POA: Diagnosis not present

## 2016-01-13 DIAGNOSIS — E7800 Pure hypercholesterolemia, unspecified: Secondary | ICD-10-CM | POA: Diagnosis not present

## 2016-01-14 ENCOUNTER — Telehealth: Payer: Self-pay

## 2016-01-14 LAB — TSH: TSH: 1.5 u[IU]/mL (ref 0.450–4.500)

## 2016-01-14 LAB — CBC WITH DIFFERENTIAL/PLATELET
BASOS ABS: 0.1 10*3/uL (ref 0.0–0.2)
Basos: 1 %
EOS (ABSOLUTE): 0.1 10*3/uL (ref 0.0–0.4)
EOS: 2 %
HEMATOCRIT: 44.3 % (ref 37.5–51.0)
HEMOGLOBIN: 15.6 g/dL (ref 12.6–17.7)
IMMATURE GRANULOCYTES: 0 %
Immature Grans (Abs): 0 10*3/uL (ref 0.0–0.1)
LYMPHS: 33 %
Lymphocytes Absolute: 1.9 10*3/uL (ref 0.7–3.1)
MCH: 29.1 pg (ref 26.6–33.0)
MCHC: 35.2 g/dL (ref 31.5–35.7)
MCV: 83 fL (ref 79–97)
MONOCYTES: 7 %
Monocytes Absolute: 0.4 10*3/uL (ref 0.1–0.9)
NEUTROS PCT: 57 %
Neutrophils Absolute: 3.2 10*3/uL (ref 1.4–7.0)
Platelets: 224 10*3/uL (ref 150–379)
RBC: 5.36 x10E6/uL (ref 4.14–5.80)
RDW: 13 % (ref 12.3–15.4)
WBC: 5.7 10*3/uL (ref 3.4–10.8)

## 2016-01-14 LAB — COMPREHENSIVE METABOLIC PANEL
A/G RATIO: 1.7 (ref 1.2–2.2)
ALT: 15 IU/L (ref 0–44)
AST: 16 IU/L (ref 0–40)
Albumin: 4.7 g/dL (ref 3.5–5.5)
Alkaline Phosphatase: 82 IU/L (ref 39–117)
BUN/Creatinine Ratio: 10 (ref 9–20)
BUN: 13 mg/dL (ref 6–20)
Bilirubin Total: 0.8 mg/dL (ref 0.0–1.2)
CALCIUM: 9.8 mg/dL (ref 8.7–10.2)
CO2: 25 mmol/L (ref 18–29)
CREATININE: 1.26 mg/dL (ref 0.76–1.27)
Chloride: 98 mmol/L (ref 96–106)
GFR, EST AFRICAN AMERICAN: 86 mL/min/{1.73_m2} (ref >59)
GFR, EST NON AFRICAN AMERICAN: 74 mL/min/{1.73_m2} (ref >59)
GLUCOSE: 95 mg/dL (ref 65–99)
Globulin, Total: 2.7 g/dL (ref 1.5–4.5)
Potassium: 4.4 mmol/L (ref 3.5–5.2)
Sodium: 140 mmol/L (ref 134–144)
TOTAL PROTEIN: 7.4 g/dL (ref 6.0–8.5)

## 2016-01-14 LAB — TESTOSTERONE: TESTOSTERONE: 300 ng/dL (ref 264–916)

## 2016-01-14 LAB — LIPID PANEL
CHOL/HDL RATIO: 4.2 ratio (ref 0.0–5.0)
CHOLESTEROL TOTAL: 182 mg/dL (ref 100–199)
HDL: 43 mg/dL (ref >39)
LDL CALC: 114 mg/dL — AB (ref 0–99)
TRIGLYCERIDES: 124 mg/dL (ref 0–149)
VLDL Cholesterol Cal: 25 mg/dL (ref 5–40)

## 2016-01-14 LAB — HEMOGLOBIN A1C
ESTIMATED AVERAGE GLUCOSE: 105 mg/dL
Hgb A1c MFr Bld: 5.3 % (ref 4.8–5.6)

## 2016-01-14 NOTE — Telephone Encounter (Signed)
Patient advised as directed below.  Thanks,  -Joseline 

## 2016-01-14 NOTE — Telephone Encounter (Signed)
-----   Message from Margaretann LovelessJennifer M Burnette, [place]PA-C sent at 01/14/2016 10:49 AM EST ----- All labs are within normal limits and stable.  Testosterone is WNL but on the low normal side. We will have to continue to monitor. Thanks! -JB

## 2017-01-18 ENCOUNTER — Ambulatory Visit: Payer: Self-pay | Admitting: Physical Therapy

## 2017-02-05 ENCOUNTER — Encounter: Payer: Self-pay | Admitting: Physician Assistant

## 2017-02-05 ENCOUNTER — Ambulatory Visit: Payer: Commercial Managed Care - PPO | Admitting: Physician Assistant

## 2017-02-05 VITALS — BP 140/90 | HR 100 | Temp 98.7°F | Resp 16 | Ht 71.0 in | Wt 235.0 lb

## 2017-02-05 DIAGNOSIS — M62838 Other muscle spasm: Secondary | ICD-10-CM | POA: Diagnosis not present

## 2017-02-05 DIAGNOSIS — M5442 Lumbago with sciatica, left side: Secondary | ICD-10-CM

## 2017-02-05 DIAGNOSIS — K2900 Acute gastritis without bleeding: Secondary | ICD-10-CM | POA: Diagnosis not present

## 2017-02-05 DIAGNOSIS — M7062 Trochanteric bursitis, left hip: Secondary | ICD-10-CM

## 2017-02-05 DIAGNOSIS — G8929 Other chronic pain: Secondary | ICD-10-CM

## 2017-02-05 MED ORDER — OMEPRAZOLE 40 MG PO CPDR: 40.0000 mg | DELAYED_RELEASE_CAPSULE | Freq: Every day | ORAL | 3 refills | Status: DC

## 2017-02-05 MED ORDER — METHOCARBAMOL 500 MG PO TABS: 250.0000 mg | ORAL_TABLET | Freq: Three times a day (TID) | ORAL | 0 refills | Status: DC | PRN

## 2017-02-05 MED ORDER — METHYLPREDNISOLONE 4 MG PO TBPK: ORAL_TABLET | ORAL | 0 refills | Status: DC

## 2017-02-05 NOTE — Progress Notes (Signed)
Patient: Eric Lane Male    DOB: [birth year]   36 y.o.   MRN: [id number] Visit Date: 02/05/2017  Today's Provider: Margaretann LovelessJennifer M Burnette, PA-C   Chief Complaint  Patient presents with  . Back Pain   Subjective:    HPI Back Pain: Patient presents for evaluation of low back problems on and off for several years.  Symptoms have been present for 4 months and include left lower back pain and hip pain. Initial inciting event: yard work. Symptoms are worst: morning. Alleviating factors identifiable by patient are sitting, resting and streching. Exacerbating factors identifiable by patient are bending backwards, bending forwards, bending sideways and standing. Treatments so far initiated by patient: physical therapy Previous lower back problems: PT. Previous workup: none. Previous treatments: PT.  Patient also C/O of pain on left lower rib area on and off x's couple months. Patient denies any nausea, vomiting, or diarrhea. Patient reports pain is more noticeable after eating. Patient reports taking OTC Gas x, and Tums and reports mild relief.   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 When he would develop this he would get massages and this would relieve the pain. 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  Most recently in September of this year he was riding a ride behind stand up aerator for his lawn and noticed the next day he had more persistent pain over the left lumbar spine with muscle tightness and sciatic type pain that radiated to left gluteal region. It has been on and off since. When it is at its worst it will radiate down the left posterior leg to just above the popliteal fossa. He is now having left hip pain as well, sometimes radiates  into the groin. He reports not being able to lie on his left hip occasionally. He is a stomach sleeper mostly but has been trying to avoid as this worsens the pain. He has seen a PT for his back pain and she felt he may have facet dysfunction. He then saw another PT afterwards that felt he may have more of a hip issue. Both sessions helped him, but not long term.     No Known Allergies   Current Outpatient Medications:  .  amoxicillin (AMOXIL) 875 MG tablet, Take 875 mg by mouth 2 (two) times daily., Disp: , Rfl:  .  mometasone-formoterol (DULERA) 200-5 MCG/ACT AERO, Inhale 2 puffs into the lungs 2 (two) times daily., Disp: , Rfl:  .  ranitidine (ZANTAC) 150 MG tablet, Take 2 tablets by mouth as needed., Disp: , Rfl:   Review of Systems  Constitutional: Negative.   Respiratory: Negative.   Cardiovascular: Negative.   Gastrointestinal: Positive for abdominal distention and abdominal pain. Negative for anal bleeding, blood in stool, constipation, diarrhea, nausea, rectal pain and vomiting.  Musculoskeletal: Positive for back pain. Negative for gait problem, myalgias, neck pain and neck stiffness.  Neurological: Negative.     Social History   Tobacco Use  . Smoking status: Former Smoker    Packs/day: 1.00    Years: 5.00    Pack years: 5.00    Types: Cigarettes    Last attempt to quit: 03/01/2005  Years since quitting: 11.9  . Smokeless tobacco: Current User    Types: Chew  Substance Use Topics  . Alcohol use: Yes    Alcohol/week: 0.0 oz    Comment: OCCASIONALLY   Objective:   BP 140/90 (BP Location: Left Arm, Patient Position: Sitting, Cuff Size: Large)   Pulse 100   Temp 98.7 F (37.1 C) (Oral)   Resp 16   Ht 5\' 11"  (1.803 m)   Wt 235 lb (106.6 kg)   SpO2 99%   BMI 32.78 kg/m  Vitals:   02/05/17 1551  BP: 140/90  Pulse: 100  Resp: 16  Temp: 98.7 F (37.1 C)  TempSrc: Oral  SpO2: 99%  Weight: 235 lb (106.6 kg)  Height: 5\' 11"  (1.803 m)     Physical Exam    Constitutional: He is oriented to person, place, and time. He appears well-developed and well-nourished. No distress.  HENT:  Head: Normocephalic and atraumatic.  Neck: Normal range of motion. Neck supple. No JVD present. No tracheal deviation present. No thyromegaly present.  Cardiovascular: Normal rate, regular rhythm and normal heart sounds. Exam reveals no gallop and no friction rub.  No murmur heard. Pulmonary/Chest: Effort normal and breath sounds normal. No respiratory distress. He has no wheezes. He has no rales.  Abdominal: Soft. Normal appearance and bowel sounds are normal. He exhibits no distension and no mass. There is no hepatosplenomegaly. There is no tenderness. There is no rebound, no guarding and no CVA tenderness.  Musculoskeletal:       Left hip: He exhibits tenderness and bony tenderness (greater trochanter). He exhibits normal range of motion and normal strength.       Lumbar back: He exhibits decreased range of motion, tenderness and spasm. He exhibits no bony tenderness.  Neg straight leg raise bilaterally LE strength 5/5  Lymphadenopathy:    He has no cervical adenopathy.  Neurological: He is alert and oriented to person, place, and time.  Skin: Skin is warm and dry. He is not diaphoretic.  Vitals reviewed.       Assessment & Plan:     1. Chronic left-sided low back pain with left-sided sciatica Suspect sciatica type pain with associated muscle spasm and now hip bursitis due to inflammation and most likely change of gait. Will give medrol dose pak and robaxin as below. Advised of heating pad, epsom salt soaks and continuing exercises taught at PT. I will see him back in 2 weeks to see if symptoms are improving. If not we will get lumbar and left hip imaging and proceed with treatment pending those results.  - methylPREDNISolone (MEDROL) 4 MG TBPK tablet; 6 day taper; take as directed on package instructions  Dispense: 21 tablet; Refill: 0  2. Muscle spasm See  above medical treatment plan. - methocarbamol (ROBAXIN) 500 MG tablet; Take 0.5-1 tablets (250-500 mg total) by mouth every 8 (eight) hours as needed for muscle spasms.  Dispense: 30 tablet; Refill: 0  3. Trochanteric bursitis of left hip See above medical treatment plan. - methylPREDNISolone (MEDROL) 4 MG TBPK tablet; 6 day taper; take as directed on package instructions  Dispense: 21 tablet; Refill: 0  4. Acute gastritis without hemorrhage, unspecified gastritis type Worsening. Will treat with 2 week trial of omeprazole. I will see him back in 2 weeks to see if patient is improving. If not will consider H.pylori testing.  - omeprazole (PRILOSEC) 40 MG capsule; Take 1 capsule (40 mg total) by mouth daily.  Dispense: 30 capsule; Refill:  3       Margaretann LovelessJennifer M Burnette, PA-C  [facility]

## 2017-02-05 NOTE — Patient Instructions (Signed)
Gastritis, Adult Gastritis is inflammation of the stomach. There are two kinds of gastritis:  Acute gastritis. This kind develops suddenly.  Chronic gastritis. This kind lasts for a long time.  Gastritis happens when the lining of the stomach becomes weak or gets damaged. Without treatment, gastritis can lead to stomach bleeding and ulcers. What are the causes? This condition may be caused by:  An infection.  Drinking too much alcohol.  Certain medicines.  Having too much acid in the stomach.  A disease of the intestines or stomach.  Stress.  What are the signs or symptoms? Symptoms of this condition include:  Pain or a burning in the upper abdomen.  Nausea.  Vomiting.  An uncomfortable feeling of fullness after eating.  In some cases, there are no symptoms. How is this diagnosed? This condition may be diagnosed with:  A description of your symptoms.  A physical exam.  Tests. These can include: ? Blood tests. ? Stool tests. ? A test in which a thin, flexible instrument with a light and camera on the end is passed down the esophagus and into the stomach (upper endoscopy). ? A test in which a sample of tissue is taken for testing (biopsy).  How is this treated? This condition may be treated with medicines. If the condition is caused by a bacterial infection, you may be given antibiotic medicines. If it is caused by too much acid in the stomach, you may get medicines called H2 blockers, proton pump inhibitors, or antacids. Treatment may also involve stopping the use of certain medicines, such as aspirin, ibuprofen, or other nonsteroidal anti-inflammatory drugs (NSAIDs). Follow these instructions at home:  Take over-the-counter and prescription medicines only as told by your health care provider.  If you were prescribed an antibiotic, take it as told by your health care provider. Do not stop taking the antibiotic even if you start to feel better.  Drink enough  fluid to keep your urine clear or pale yellow.  Eat small, frequent meals instead of large meals. Contact a health care provider if:  Your symptoms get worse.  Your symptoms return after treatment. Get help right away if:  You vomit blood or material that looks like coffee grounds.  You have black or dark red stools.  You are unable to keep fluids down.  Your abdominal pain gets worse.  You have a fever.  You do not feel better after 1 week. This information is not intended to replace advice given to you by your health care provider. Make sure you discuss any questions you have with your health care provider. Document Released: 02/10/2001 Document Revised: 10/16/2015 Document Reviewed: 11/10/2014 Elsevier Interactive Patient Education  2018 [employer] Sciatica Sciatica is pain, numbness, weakness, or tingling along the path of the sciatic nerve. The sciatic nerve starts in the lower back and runs down the back of each leg. The nerve controls the muscles in the lower leg and in the back of the knee. It also provides feeling (sensation) to the back of the thigh, the lower leg, and the sole of the foot. Sciatica is a symptom of another medical condition that pinches or puts pressure on the sciatic nerve. Generally, sciatica only affects one side of the body. Sciatica usually goes away on its own or with treatment. In some cases, sciatica may keep coming back (recur). What are the causes? This condition is caused by pressure on the sciatic nerve, or pinching of the sciatic nerve. This may be the result  of:  A disk in between the bones of the spine (vertebrae) bulging out too far (herniated disk).  Age-related changes in the spinal disks (degenerative disk disease).  A pain disorder that affects a muscle in the buttock (piriformis syndrome).  Extra bone growth (bone spur) near the sciatic nerve.  An injury or break (fracture) of the pelvis.  Pregnancy.  Tumor (rare).  What  increases the risk? The following factors may make you more likely to develop this condition:  Playing sports that place pressure or stress on the spine, such as football or weight lifting.  Having poor strength and flexibility.  A history of back injury.  A history of back surgery.  Sitting for long periods of time.  Doing activities that involve repetitive bending or lifting.  Obesity.  What are the signs or symptoms? Symptoms can vary from mild to very severe, and they may include:  Any of these problems in the lower back, leg, hip, or buttock: ? Mild tingling or dull aches. ? Burning sensations. ? Sharp pains.  Numbness in the back of the calf or the sole of the foot.  Leg weakness.  Severe back pain that makes movement difficult.  These symptoms may get worse when you cough, sneeze, or laugh, or when you sit or stand for long periods of time. Being overweight may also make symptoms worse. In some cases, symptoms may recur over time. How is this diagnosed? This condition may be diagnosed based on:  Your symptoms.  A physical exam. Your health care provider may ask you to do certain movements to check whether those movements trigger your symptoms.  You may have tests, including: ? Blood tests. ? X-rays. ? MRI. ? CT scan.  How is this treated? In many cases, this condition improves on its own, without any treatment. However, treatment may include:  Reducing or modifying physical activity during periods of pain.  Exercising and stretching to strengthen your abdomen and improve the flexibility of your spine.  Icing and applying heat to the affected area.  Medicines that help: ? To relieve pain and swelling. ? To relax your muscles.  Injections of medicines that help to relieve pain, irritation, and inflammation around the sciatic nerve (steroids).  Surgery.  Follow these instructions at home: Medicines  Take over-the-counter and prescription medicines  only as told by your health care provider.  Do not drive or operate heavy machinery while taking prescription pain medicine. Managing pain  If directed, apply ice to the affected area. ? Put ice in a plastic bag. ? Place a towel between your skin and the bag. ? Leave the ice on for 20 minutes, 2-3 times a day.  After icing, apply heat to the affected area before you exercise or as often as told by your health care provider. Use the heat source that your health care provider recommends, such as a moist heat pack or a heating pad. ? Place a towel between your skin and the heat source. ? Leave the heat on for 20-30 minutes. ? Remove the heat if your skin turns bright red. This is especially important if you are unable to feel pain, heat, or cold. You may have a greater risk of getting burned. Activity  Return to your normal activities as told by your health care provider. Ask your health care provider what activities are safe for you. ? Avoid activities that make your symptoms worse.  Take brief periods of rest throughout the day. Resting in a lying  or standing position is usually better than sitting to rest. ? When you rest for longer periods, mix in some mild activity or stretching between periods of rest. This will help to prevent stiffness and pain. ? Avoid sitting for long periods of time without moving. Get up and move around at least one time each hour.  Exercise and stretch regularly, as told by your health care provider.  Do not lift anything that is heavier than 10 lb (4.5 kg) while you have symptoms of sciatica. When you do not have symptoms, you should still avoid heavy lifting, especially repetitive heavy lifting.  When you lift objects, always use proper lifting technique, which includes: ? Bending your knees. ? Keeping the load close to your body. ? Avoiding twisting. General instructions  Use good posture. ? Avoid leaning forward while sitting. ? Avoid hunching over  while standing.  Maintain a healthy weight. Excess weight puts extra stress on your back and makes it difficult to maintain good posture.  Wear supportive, comfortable shoes. Avoid wearing high heels.  Avoid sleeping on a mattress that is too soft or too hard. A mattress that is firm enough to support your back when you sleep may help to reduce your pain.  Keep all follow-up visits as told by your health care provider. This is important. Contact a health care provider if:  You have pain that wakes you up when you are sleeping.  You have pain that gets worse when you lie down.  Your pain is worse than you have experienced in the past.  Your pain lasts longer than 4 weeks.  You experience unexplained weight loss. Get help right away if:  You lose control of your bowel or bladder (incontinence).  You have: ? Weakness in your lower back, pelvis, buttocks, or legs that gets worse. ? Redness or swelling of your back. ? A burning sensation when you urinate. This information is not intended to replace advice given to you by your health care provider. Make sure you discuss any questions you have with your health care provider. Document Released: 02/10/2001 Document Revised: 07/23/2015 Document Reviewed: 10/26/2014 Elsevier Interactive Patient Education  2017 [employer].

## 2017-02-19 ENCOUNTER — Encounter: Payer: Self-pay | Admitting: Physician Assistant

## 2017-02-19 ENCOUNTER — Ambulatory Visit
Admission: RE | Admit: 2017-02-19 | Discharge: 2017-02-19 | Disposition: A | Discharge status: Home / Self Care | Payer: Commercial Managed Care - PPO | Source: Ambulatory Visit | Attending: Physician Assistant | Admitting: Physician Assistant

## 2017-02-19 ENCOUNTER — Ambulatory Visit (INDEPENDENT_AMBULATORY_CARE_PROVIDER_SITE_OTHER): Payer: Commercial Managed Care - PPO | Admitting: Physician Assistant

## 2017-02-19 ENCOUNTER — Other Ambulatory Visit: Payer: Self-pay

## 2017-02-19 VITALS — BP 136/82 | HR 100 | Temp 98.0°F | Resp 16 | Wt 233.0 lb

## 2017-02-19 DIAGNOSIS — M1288 Other specific arthropathies, not elsewhere classified, other specified site: Secondary | ICD-10-CM | POA: Diagnosis not present

## 2017-02-19 DIAGNOSIS — M519 Unspecified thoracic, thoracolumbar and lumbosacral intervertebral disc disorder: Secondary | ICD-10-CM | POA: Insufficient documentation

## 2017-02-19 DIAGNOSIS — M25552 Pain in left hip: Secondary | ICD-10-CM

## 2017-02-19 DIAGNOSIS — M5442 Lumbago with sciatica, left side: Secondary | ICD-10-CM

## 2017-02-19 DIAGNOSIS — M4316 Spondylolisthesis, lumbar region: Secondary | ICD-10-CM | POA: Diagnosis not present

## 2017-02-19 DIAGNOSIS — K2900 Acute gastritis without bleeding: Secondary | ICD-10-CM | POA: Diagnosis not present

## 2017-02-19 NOTE — Patient Instructions (Signed)

## 2017-02-19 NOTE — Progress Notes (Signed)
Patient: Eric Lane Male    DOB: Mar 24, 1980   36 y.o.   MRN: [id number] Visit Date: 02/19/2017  Today's Provider: Margaretann LovelessJennifer M Burnette, PA-C   Chief Complaint  Patient presents with  . Back Pain  . Gastritis   Subjective:    HPI     Follow up for Chronic Left-Sided Low Back Pain with Sciatica  The patient was last seen for this 2 weeks ago. Changes made at last visit include adding Prednisone 6 day taper and Robaxin.  He reports excellent compliance with treatment. He feels that condition is Unchanged. He states the Robaxin is ineffective. The Prednisone helped pain while on medication, but pain returned after taper was completed. He is c/o back stiffness, some hip pain. He states the sciatica has improved. He is not having side effects.  ------------------------------------------------------------------------------------   Follow up for Gastritis  The patient was last seen for this 2 weeks ago. Changes made at last visit include adding Omeprazole 40 mg.  He reports excellent compliance with treatment. He feels that condition is Unchanged. He is c/o loose stools, flatus, same abdominal pain. He is not having side effects.  ------------------------------------------------------------------------------------   No Known Allergies   Current Outpatient Medications:  .  mometasone-formoterol (DULERA) 200-5 MCG/ACT AERO, Inhale 2 puffs into the lungs 2 (two) times daily., Disp: , Rfl:  .  omeprazole (PRILOSEC) 40 MG capsule, Take 1 capsule (40 mg total) by mouth daily., Disp: 30 capsule, Rfl: 3 .  methocarbamol (ROBAXIN) 500 MG tablet, Take 0.5-1 tablets (250-500 mg total) by mouth every 8 (eight) hours as needed for muscle spasms. (Patient not taking: Reported on 02/19/2017), Disp: 30 tablet, Rfl: 0  Review of Systems  Constitutional: Positive for fatigue. Negative for activity change, appetite change, chills, diaphoresis, fever and unexpected weight change.    Respiratory: Negative.   Cardiovascular: Negative.   Gastrointestinal: Positive for abdominal distention, abdominal pain and diarrhea. Negative for blood in stool, constipation, nausea and vomiting.  Musculoskeletal: Positive for arthralgias and back pain.  Neurological: Negative.     Social History   Tobacco Use  . Smoking status: Former Smoker    Packs/day: 1.00    Years: 5.00    Pack years: 5.00    Types: Cigarettes    Last attempt to quit: 03/01/2005    Years since quitting: 11.9  . Smokeless tobacco: Current User    Types: Chew  Substance Use Topics  . Alcohol use: Yes    Alcohol/week: 0.0 oz    Comment: OCCASIONALLY   Objective:   BP 136/82 (BP Location: Left Arm, Patient Position: Sitting, Cuff Size: Large)   Pulse 100   Temp 98 F (36.7 C) (Oral)   Resp 16   Wt 233 lb (105.7 kg)   SpO2 98%   BMI 32.50 kg/m  Vitals:   02/19/17 1609  BP: 136/82  Pulse: 100  Resp: 16  Temp: 98 F (36.7 C)  TempSrc: Oral  SpO2: 98%  Weight: 233 lb (105.7 kg)     Physical Exam  Constitutional: He appears well-developed and well-nourished. No distress.  HENT:  Head: Normocephalic and atraumatic.  Neck: Normal range of motion. Neck supple.  Cardiovascular: Normal rate, regular rhythm and normal heart sounds. Exam reveals no gallop and no friction rub.  No murmur heard. Pulmonary/Chest: Effort normal and breath sounds normal. No respiratory distress. He has no wheezes. He has no rales.  Abdominal: Soft. Bowel sounds are normal. He exhibits distension.  He exhibits no mass. There is tenderness. There is no rebound and no guarding.  Musculoskeletal:       Lumbar back: He exhibits decreased range of motion and tenderness. He exhibits no bony tenderness and no spasm.  Skin: He is not diaphoretic.  Vitals reviewed.      Assessment & Plan:     1. Acute midline low back pain with left-sided sciatica Will get imaging as below since patient did not have complete relief with  conservative management. Continue stretching and moist heat until results from xray are received. Will f/u pending results. May require referral to orthopedics.  - DG Lumbar Spine Complete; Future - DG HIP UNILAT WITH PELVIS 2-3 VIEWS LEFT; Future  2. Left hip pain See above medical treatment plan. - DG Lumbar Spine Complete; Future - DG HIP UNILAT WITH PELVIS 2-3 VIEWS LEFT; Future  3. Acute gastritis without hemorrhage, unspecified gastritis type Will have him stop omeprazole, question symptoms as side effect of omeprazole since all started when he started omeprazole. He is to return next week for H.pylori testing. I will f/u with him pending results. - H. pylori breath test       Margaretann LovelessJennifer M Burnette, PA-C  [facility]

## 2017-02-22 ENCOUNTER — Telehealth: Payer: Self-pay

## 2017-02-22 DIAGNOSIS — M47817 Spondylosis without myelopathy or radiculopathy, lumbosacral region: Secondary | ICD-10-CM

## 2017-02-22 DIAGNOSIS — M5136 Other intervertebral disc degeneration, lumbar region: Secondary | ICD-10-CM

## 2017-02-22 NOTE — Telephone Encounter (Signed)
-----   Message from Margaretann LovelessJennifer M Burnette, [place]PA-C sent at 02/22/2017  8:33 AM EST ----- Spinal xray reveals a forward slipping of the L5 vertebrae over S1 with DDD noted at L5-S1 as well. I would recommend referral to ortho spine at this time for further evaluation.

## 2017-02-22 NOTE — Telephone Encounter (Signed)
Pt advised, referral placed-[name], RMA

## 2017-03-11 ENCOUNTER — Encounter: Payer: Self-pay | Admitting: Physician Assistant

## 2017-03-11 ENCOUNTER — Ambulatory Visit (INDEPENDENT_AMBULATORY_CARE_PROVIDER_SITE_OTHER): Payer: Commercial Managed Care - PPO | Admitting: Physician Assistant

## 2017-03-11 VITALS — BP 118/80 | HR 84 | Temp 97.9°F | Resp 16 | Ht 71.0 in | Wt 232.0 lb

## 2017-03-11 DIAGNOSIS — E7800 Pure hypercholesterolemia, unspecified: Secondary | ICD-10-CM | POA: Diagnosis not present

## 2017-03-11 DIAGNOSIS — Z0000 Encounter for general adult medical examination without abnormal findings: Secondary | ICD-10-CM | POA: Diagnosis not present

## 2017-03-11 DIAGNOSIS — E559 Vitamin D deficiency, unspecified: Secondary | ICD-10-CM | POA: Diagnosis not present

## 2017-03-11 DIAGNOSIS — E349 Endocrine disorder, unspecified: Secondary | ICD-10-CM | POA: Diagnosis not present

## 2017-03-11 DIAGNOSIS — R739 Hyperglycemia, unspecified: Secondary | ICD-10-CM | POA: Diagnosis not present

## 2017-03-11 NOTE — Patient Instructions (Signed)

## 2017-03-11 NOTE — Progress Notes (Signed)
Patient: Eric Lane, Male    DOB: 1980/06/04, 37 y.o.   MRN: [id number] Visit Date: 03/11/2017  Today's Provider: Mar Daring, PA-C   Chief Complaint  Patient presents with  . Annual Exam   Subjective:    Annual physical exam Eric Lane is a 37 y.o. male who presents today for health maintenance and complete physical. He feels well. He reports exercising some. He reports he is sleeping fairly well. ----------------------------------------------------------------   Review of Systems  Constitutional: Negative.   HENT: Negative.   Eyes: Negative.   Respiratory: Negative.   Cardiovascular: Negative.   Gastrointestinal: Positive for abdominal distention.  Endocrine: Negative.   Genitourinary: Negative.   Musculoskeletal: Positive for back pain.  Skin: Negative.   Allergic/Immunologic: Negative.   Neurological: Negative.   Hematological: Negative.   Psychiatric/Behavioral: Negative.     Social History      He  reports that he quit smoking about 12 years ago. His smoking use included cigarettes. He has a 5.00 pack-year smoking history. His smokeless tobacco use includes chew. He reports that he drinks alcohol. He reports that he does not use drugs.       Social History   Socioeconomic History  . Marital status: Married    Spouse name: [name]  . Number of children: 1  . Years of education: 60  . Highest education level: None  Social Needs  . Financial resource strain: None  . Food insecurity - worry: None  . Food insecurity - inability: None  . Transportation needs - medical: None  . Transportation needs - non-medical: None  Occupational History  . Occupation: Chemical engineer: [employer]  Tobacco Use  . Smoking status: Former Smoker    Packs/day: 1.00    Years: 5.00    Pack years: 5.00    Types: Cigarettes    Last attempt to quit: 03/01/2005    Years since quitting: 12.0  . Smokeless tobacco: Current User    Types: Chew    Substance and Sexual Activity  . Alcohol use: Yes    Alcohol/week: 0.0 oz    Comment: OCCASIONALLY  . Drug use: No  . Sexual activity: None  Other Topics Concern  . None  Social History Narrative  . None    History reviewed. No pertinent past medical history.   Patient Active Problem List   Diagnosis Date Noted  . CN (constipation) 08/15/2014  . Elevated blood sugar 08/15/2014  . Brash 08/15/2014  . External hemorrhoid 08/15/2014  . Hypercholesteremia 08/15/2014  . Hypotestosteronism 08/15/2014  . Cardiac murmur 08/15/2014  . Awareness of heartbeats 08/15/2014  . Avitaminosis D 08/15/2014    Past Surgical History:  Procedure Laterality Date  . LASIK Bilateral 09/20/2014    Family History        Family Status  Relation Name Status  . Mother  Alive  . Father  Alive  . Sister  Alive  . MGM  Alive  . MGF  Deceased  . PGM  Deceased  . PGF  Deceased        His family history includes Alzheimer's disease in his paternal grandmother; COPD in his maternal grandmother; Cancer in his maternal grandfather; Congestive Heart Failure in his maternal grandmother; Diabetes in his maternal grandmother; Hyperlipidemia in his father; Hypertension in his mother; Stomach cancer in his paternal grandfather.     No Known Allergies   Current Outpatient Medications:  .  methocarbamol (ROBAXIN)  500 MG tablet, Take 0.5-1 tablets (250-500 mg total) by mouth every 8 (eight) hours as needed for muscle spasms. (Patient not taking: Reported on 02/19/2017), Disp: 30 tablet, Rfl: 0 .  mometasone-formoterol (DULERA) 200-5 MCG/ACT AERO, Inhale 2 puffs into the lungs 2 (two) times daily., Disp: , Rfl:  .  omeprazole (PRILOSEC) 40 MG capsule, Take 1 capsule (40 mg total) by mouth daily. (Patient not taking: Reported on 03/11/2017), Disp: 30 capsule, Rfl: 3   Patient Care Team: Mar Daring, PA-C as PCP - General (Physician Assistant)      Objective:   Vitals: BP 118/80 (BP Location:  Left Arm, Patient Position: Sitting, Cuff Size: Large)   Pulse 84   Temp 97.9 F (36.6 C) (Oral)   Resp 16   Ht 5' 11"  (1.803 m)   Wt 232 lb (105.2 kg)   BMI 32.36 kg/m    Vitals:   03/11/17 0807  BP: 118/80  Pulse: 84  Resp: 16  Temp: 97.9 F (36.6 C)  TempSrc: Oral  Weight: 232 lb (105.2 kg)  Height: 5' 11"  (1.803 m)     Physical Exam  Constitutional: He is oriented to person, place, and time. He appears well-developed and well-nourished.  HENT:  Head: Normocephalic and atraumatic.  Right Ear: Hearing, tympanic membrane, external ear and ear canal normal.  Left Ear: Hearing, tympanic membrane, external ear and ear canal normal.  Nose: Nose normal.  Mouth/Throat: Uvula is midline, oropharynx is clear and moist and mucous membranes are normal.  Eyes: Conjunctivae and EOM are normal. Pupils are equal, round, and reactive to light. Right eye exhibits no discharge.  Neck: Normal range of motion. Neck supple. No tracheal deviation present. No thyromegaly present.  Cardiovascular: Normal rate, regular rhythm, normal heart sounds and intact distal pulses.  No murmur heard. Pulmonary/Chest: Effort normal and breath sounds normal. No respiratory distress. He has no wheezes. He has no rales. He exhibits no tenderness.  Abdominal: Soft. He exhibits no distension and no mass. There is no tenderness. There is no rebound and no guarding.  Genitourinary:  Genitourinary Comments: deferred  Musculoskeletal: Normal range of motion. He exhibits no edema or tenderness.  Lymphadenopathy:    He has no cervical adenopathy.  Neurological: He is alert and oriented to person, place, and time. He has normal reflexes. No cranial nerve deficit. He exhibits normal muscle tone. Coordination normal.  Skin: Skin is warm and dry. No rash noted. No erythema.  Psychiatric: He has a normal mood and affect. His behavior is normal. Judgment and thought content normal.     Depression Screen PHQ 2/9 Scores  03/11/2017 02/05/2017 09/24/2014  PHQ - 2 Score 0 0 0  PHQ- 9 Score - 0 -      Assessment & Plan:     Routine Health Maintenance and Physical Exam  Exercise Activities and Dietary recommendations Goals    None      Immunization History  Administered Date(s) Administered  . DTaP 10/10/1987  . IPV 10/10/1987  . Influenza,inj,Quad PF,6+ Mos 12/11/2015  . MMR 06/04/2000  . Td 11/22/2013  . Tdap 11/22/2013    Health Maintenance  Topic Date Due  . HIV Screening  02/11/1997  . TETANUS/TDAP  11/23/2023  . INFLUENZA VACCINE  Completed     Discussed health benefits of physical activity, and encouraged him to engage in regular exercise appropriate for his age and condition.   1. Annual physical exam Normal physical exam today. Will check labs as below and  f/u pending lab results. If labs are stable and WNL he will not need to have these rechecked for one year at his next annual physical exam. He is to call the office in the meantime if he has any acute issue, questions or concerns. - CBC with Differential/Platelet - Comprehensive metabolic panel - TSH  2. Elevated blood sugar Will check labs as below and f/u pending results. - Hemoglobin A1c  3. Hypercholesteremia Will check labs as below and f/u pending results. - Lipid panel  4. Hypotestosteronism H/O this. Not on treatment. Will check labs as below and f/u pending results. - Testosterone  5. Avitaminosis D H/O this. Not on supplement. Now with job that is strictly indoors. Will check labs as below and f/u pending results. - Vitamin D (25 hydroxy)   --------------------------------------------------------------------    Mar Daring, PA-C  [facility]

## 2017-03-12 LAB — COMPREHENSIVE METABOLIC PANEL
ALK PHOS: 83 IU/L (ref 39–117)
ALT: 26 IU/L (ref 0–44)
AST: 22 IU/L (ref 0–40)
Albumin/Globulin Ratio: 1.4 (ref 1.2–2.2)
Albumin: 4.6 g/dL (ref 3.5–5.5)
BUN/Creatinine Ratio: 10 (ref 9–20)
BUN: 13 mg/dL (ref 6–20)
Bilirubin Total: 0.7 mg/dL (ref 0.0–1.2)
CALCIUM: 10.5 mg/dL — AB (ref 8.7–10.2)
CO2: 26 mmol/L (ref 20–29)
CREATININE: 1.24 mg/dL (ref 0.76–1.27)
Chloride: 98 mmol/L (ref 96–106)
GFR calc Af Amer: 87 mL/min/{1.73_m2} (ref >59)
GFR, EST NON AFRICAN AMERICAN: 75 mL/min/{1.73_m2} (ref >59)
GLOBULIN, TOTAL: 3.2 g/dL (ref 1.5–4.5)
Glucose: 86 mg/dL (ref 65–99)
POTASSIUM: 4.5 mmol/L (ref 3.5–5.2)
SODIUM: 139 mmol/L (ref 134–144)
Total Protein: 7.8 g/dL (ref 6.0–8.5)

## 2017-03-12 LAB — CBC WITH DIFFERENTIAL/PLATELET
BASOS ABS: 0.1 10*3/uL (ref 0.0–0.2)
Basos: 1 %
EOS (ABSOLUTE): 0.1 10*3/uL (ref 0.0–0.4)
Eos: 1 %
Hematocrit: 43.9 % (ref 37.5–51.0)
Hemoglobin: 15 g/dL (ref 13.0–17.7)
IMMATURE GRANULOCYTES: 0 %
Immature Grans (Abs): 0 10*3/uL (ref 0.0–0.1)
LYMPHS ABS: 2.4 10*3/uL (ref 0.7–3.1)
Lymphs: 23 %
MCH: 28.5 pg (ref 26.6–33.0)
MCHC: 34.2 g/dL (ref 31.5–35.7)
MCV: 83 fL (ref 79–97)
MONOS ABS: 0.7 10*3/uL (ref 0.1–0.9)
Monocytes: 7 %
NEUTROS PCT: 68 %
Neutrophils Absolute: 7 10*3/uL (ref 1.4–7.0)
PLATELETS: 231 10*3/uL (ref 150–379)
RBC: 5.27 x10E6/uL (ref 4.14–5.80)
RDW: 14.1 % (ref 12.3–15.4)
WBC: 10.3 10*3/uL (ref 3.4–10.8)

## 2017-03-12 LAB — TESTOSTERONE: TESTOSTERONE: 342 ng/dL (ref 264–916)

## 2017-03-12 LAB — VITAMIN D 25 HYDROXY (VIT D DEFICIENCY, FRACTURES): Vit D, 25-Hydroxy: 25.7 ng/mL — ABNORMAL LOW (ref 30.0–100.0)

## 2017-03-12 LAB — HEMOGLOBIN A1C
ESTIMATED AVERAGE GLUCOSE: 111 mg/dL
Hgb A1c MFr Bld: 5.5 % (ref 4.8–5.6)

## 2017-03-12 LAB — LIPID PANEL
CHOLESTEROL TOTAL: 190 mg/dL (ref 100–199)
Chol/HDL Ratio: 4.5 ratio (ref 0.0–5.0)
HDL: 42 mg/dL (ref >39)
LDL Calculated: 103 mg/dL — ABNORMAL HIGH (ref 0–99)
Triglycerides: 226 mg/dL — ABNORMAL HIGH (ref 0–149)
VLDL CHOLESTEROL CAL: 45 mg/dL — AB (ref 5–40)

## 2017-03-12 LAB — TSH: TSH: 1.15 u[IU]/mL (ref 0.450–4.500)

## 2017-12-01 ENCOUNTER — Encounter (HOSPITAL_COMMUNITY): Payer: Self-pay | Admitting: Internal Medicine

## 2017-12-01 ENCOUNTER — Ambulatory Visit (HOSPITAL_COMMUNITY)
Admission: RE | Admit: 2017-12-01 | Discharge: 2017-12-01 | Disposition: A | Discharge status: Home / Self Care | Payer: Commercial Managed Care - PPO | Source: Ambulatory Visit | Attending: Internal Medicine | Admitting: Internal Medicine

## 2017-12-01 VITALS — BP 128/80 | HR 77 | Wt 231.2 lb

## 2017-12-01 DIAGNOSIS — Z87891 Personal history of nicotine dependence: Secondary | ICD-10-CM | POA: Insufficient documentation

## 2017-12-01 DIAGNOSIS — Z800 Family history of malignant neoplasm of digestive organs: Secondary | ICD-10-CM | POA: Diagnosis not present

## 2017-12-01 DIAGNOSIS — R0683 Snoring: Secondary | ICD-10-CM | POA: Diagnosis not present

## 2017-12-01 DIAGNOSIS — Z7189 Other specified counseling: Secondary | ICD-10-CM

## 2017-12-01 DIAGNOSIS — Z8349 Family history of other endocrine, nutritional and metabolic diseases: Secondary | ICD-10-CM | POA: Insufficient documentation

## 2017-12-01 DIAGNOSIS — E7800 Pure hypercholesterolemia, unspecified: Secondary | ICD-10-CM

## 2017-12-01 DIAGNOSIS — Z136 Encounter for screening for cardiovascular disorders: Secondary | ICD-10-CM | POA: Insufficient documentation

## 2017-12-01 DIAGNOSIS — E669 Obesity, unspecified: Secondary | ICD-10-CM | POA: Diagnosis not present

## 2017-12-01 DIAGNOSIS — E781 Pure hyperglyceridemia: Secondary | ICD-10-CM | POA: Insufficient documentation

## 2017-12-01 DIAGNOSIS — Z79899 Other long term (current) drug therapy: Secondary | ICD-10-CM | POA: Insufficient documentation

## 2017-12-01 DIAGNOSIS — Z8249 Family history of ischemic heart disease and other diseases of the circulatory system: Secondary | ICD-10-CM | POA: Diagnosis not present

## 2017-12-01 DIAGNOSIS — R002 Palpitations: Secondary | ICD-10-CM

## 2017-12-01 DIAGNOSIS — Z833 Family history of diabetes mellitus: Secondary | ICD-10-CM | POA: Insufficient documentation

## 2017-12-01 LAB — CBC
HCT: 45.5 % (ref 39.0–52.0)
Hemoglobin: 14.8 g/dL (ref 13.0–17.0)
MCH: 28.1 pg (ref 26.0–34.0)
MCHC: 32.5 g/dL (ref 30.0–36.0)
MCV: 86.3 fL (ref 78.0–100.0)
PLATELETS: 201 10*3/uL (ref 150–400)
RBC: 5.27 MIL/uL (ref 4.22–5.81)
RDW: 12.8 % (ref 11.5–15.5)
WBC: 6 10*3/uL (ref 4.0–10.5)

## 2017-12-01 LAB — COMPREHENSIVE METABOLIC PANEL
ALBUMIN: 4.1 g/dL (ref 3.5–5.0)
ALT: 33 U/L (ref 0–44)
ANION GAP: 7 (ref 5–15)
AST: 26 U/L (ref 15–41)
Alkaline Phosphatase: 65 U/L (ref 38–126)
BUN: 13 mg/dL (ref 6–20)
CALCIUM: 9.3 mg/dL (ref 8.9–10.3)
CHLORIDE: 106 mmol/L (ref 98–111)
CO2: 25 mmol/L (ref 22–32)
Creatinine, Ser: 1.1 mg/dL (ref 0.61–1.24)
GFR calc non Af Amer: >60 mL/min (ref >60)
Glucose, Bld: 86 mg/dL (ref 70–99)
POTASSIUM: 3.9 mmol/L (ref 3.5–5.1)
SODIUM: 138 mmol/L (ref 135–145)
Total Bilirubin: 0.7 mg/dL (ref 0.3–1.2)
Total Protein: 7.5 g/dL (ref 6.5–8.1)

## 2017-12-01 LAB — LIPID PANEL
CHOL/HDL RATIO: 4.4 ratio
Cholesterol: 186 mg/dL (ref 0–200)
HDL: 42 mg/dL (ref >40)
LDL CALC: 118 mg/dL — AB (ref 0–99)
TRIGLYCERIDES: 129 mg/dL (ref <150)
VLDL: 26 mg/dL (ref 0–40)

## 2017-12-01 NOTE — Patient Instructions (Signed)
Today you have been seen at the Heart failure clinic at [facility]    Lad work done today: CBC, CMET, LIPID PANEL, HgBA1C   You have the following test scheduled for:  ETT (stress test)               Coronary Calcium Scoring Scan   Your physician has requested that you have an exercise tolerance test. For further information please visit [email or web address] Please also follow instruction sheet, as given.  Your physician has requested that you have an coronary calcium scoring scan.  Follow up with Dr. Gala Romney in 2 months.

## 2017-12-01 NOTE — Progress Notes (Signed)
CARDIOLOGY CLINIC NEW PATIENT NOTE   Primary Care: Gillis Santa PA-C [facility])   HPI:  Eric Lane is a 37 y/o male with h/o obesity, hypertriglyceridemia and strong family h/o CAD presents for cardiac evaluation.  Denies h/o known heart disease. Has gained about 30 pounds in past 2 years. Recently checked non-fasting TGs and was > 500. 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 No CP or SOB. Snores heavily with weight gain. + daytime fatigue. HGBA1c = 5.3. Feels anxious and having PVCs at night.   Dad recently had LAD infarct in early May and got stent at [facility]  Had echo done several years ago for heart murmur and was normal.    Review of Systems: [y] = yes, [ ]  = no   General: Weight gain Cove.Etienne ]; Weight loss [ ] ; Anorexia [ ] ; Fatigue [ ] ; Fever [ ] ; Chills [ ] ; Weakness [ ]   Cardiac: Chest pain/pressure [ ] ; Resting SOB [ ] ; Exertional SOB [ ] ; Orthopnea [ ] ; Pedal Edema [ ] ; Palpitations Cove.Etienne ]; Syncope [ ] ; Presyncope [ ] ; Paroxysmal nocturnal dyspnea[ ]   Pulmonary: Cough [ ] ; Wheezing[ ] ; Hemoptysis[ ] ; Sputum [ ] ; Snoring [ ]   GI: Vomiting[ ] ; Dysphagia[ ] ; Melena[ ] ; Hematochezia [ ] ; Heartburn[ ] ; Abdominal pain [ ] ; Constipation [ ] ; Diarrhea [ ] ; BRBPR [ ]   GU: Hematuria[ ] ; Dysuria [ ] ; Nocturia[ ]   Vascular: Pain in legs with walking [ ] ; Pain in feet with lying flat [ ] ; Non-healing sores [ ] ; Stroke [ ] ; TIA [ ] ; Slurred speech [ ] ;  Neuro: Headaches[ ] ; Vertigo[ ] ; Seizures[ ] ; Paresthesias[ ] ;Blurred vision [ ] ; Diplopia [ ] ; Vision changes [ ]   Ortho/Skin: Arthritis [ ] ; Joint pain [ ] ; Muscle pain [ ] ; Joint swelling [ ] ; Back Pain Cove.Etienne ]; Rash [ ]   Psych: Depression[ ] ; Anxiety[y ]  Heme: Bleeding problems [ ] ; Clotting disorders [ ] ; Anemia [ ]   Endocrine: Diabetes [ ] ; Thyroid dysfunction[ ]    PMHx: 1. Obesity 2.  Hypertriglyceridemia  Current Outpatient Medications  Medication Sig Dispense Refill  . mometasone-formoterol (DULERA) 200-5 MCG/ACT AERO Inhale 2 puffs into the lungs 2 (two) times daily.    Marland Kitchen omeprazole (PRILOSEC) 40 MG capsule Take 1 capsule (40 mg total) by mouth daily. 30 capsule 3   No current facility-administered medications for this encounter.     No Known Allergies    Social History   Socioeconomic History  . Marital status: Married    Spouse name: Judeth Cornfield  . Number of children: 1  . Years of education: 23  . Highest education level: Not on file  Occupational History  . Occupation: Civil engineer, contracting: [employer]  Social Needs  . Financial resource strain: Not on file  . Food insecurity:    Worry: Not on file    Inability: Not on file  . Transportation needs:    Medical: Not on file    Non-medical: Not on file  Tobacco Use  . Smoking status: Former Smoker    Packs/day: 1.00    Years: 5.00    Pack years: 5.00    Types: Cigarettes    Last attempt to quit: 03/01/2005    Years since quitting: 12.7  . Smokeless tobacco: Current User    Types: Chew  Substance and Sexual Activity  .  Alcohol use: Yes    Alcohol/week: 0.0 standard drinks    Comment: OCCASIONALLY  . Drug use: No  . Sexual activity: Not on file  Lifestyle  . Physical activity:    Days per week: Not on file    Minutes per session: Not on file  . Stress: Not on file  Relationships  . Social connections:    Talks on phone: Not on file    Gets together: Not on file    Attends religious service: Not on file    Active member of club or organization: Not on file    Attends meetings of clubs or organizations: Not on file    Relationship status: Not on file  . Intimate partner violence:    Fear of current or ex partner: Not on file    Emotionally abused: Not on file    Physically abused: Not on file    Forced sexual activity: Not on file  Other Topics Concern  . Not on file   Social History Narrative  . Not on file      Family History  Problem Relation Age of Onset  . Hypertension Mother   . Hyperlipidemia Father   . Diabetes Maternal Grandmother   . COPD Maternal Grandmother   . Congestive Heart Failure Maternal Grandmother   . Cancer Maternal Grandfather   . Alzheimer's disease Paternal Grandmother   . Stomach cancer Paternal Grandfather    Father with MI at age 21.   Vitals:   12/01/17 1019  BP: 128/80  Pulse: 77  SpO2: 100%  Weight: 104.9 kg (231 lb 4 oz)    PHYSICAL EXAM: General:  Well appearing. No respiratory difficulty HEENT: normal Neck: supple. no JVD. Carotids 2+ bilat; no bruits. No lymphadenopathy or thryomegaly appreciated. Cor: PMI nondisplaced. Regular rate & rhythm. No rubs, gallops or murmurs. Lungs: clear Abdomen: obese soft, nontender, nondistended. No hepatosplenomegaly. No bruits or masses. Good bowel sounds. Extremities: no cyanosis, clubbing, rash, edema Neuro: alert & oriented x 3, cranial nerves grossly intact. moves all 4 extremities w/o difficulty. Affect pleasant.  ECG: SBrady 65 No ST-T wave abnormalities.    ASSESSMENT & PLAN: 1. Cardiovascular risk screening - he has strong FHx of heart disease - Has gained about 20-30 pounds in past year with development of hypertriglyceridemia - will get ETT and coronary calcium scoring - recheck fasting lipids - stressed need to lose weight and be more active  2. Hypertriglyceridemia - recheck fasting lipids - stressed need to lose weight and be more active - Suggested [facility] Diet  3. Snoring - Likely sleep apnea - Will work on weight loss for 2 months. If not improved will need sleep study  4. Palpitations - Likely PVCs due to anxiety and OSA - Place monitor if persists  Arvilla Meres, MD  11:19 AM

## 2017-12-02 LAB — HEMOGLOBIN A1C
Hgb A1c MFr Bld: 5.4 % (ref 4.8–5.6)
Mean Plasma Glucose: 108 mg/dL

## 2017-12-22 ENCOUNTER — Ambulatory Visit (INDEPENDENT_AMBULATORY_CARE_PROVIDER_SITE_OTHER): Payer: Commercial Managed Care - PPO

## 2017-12-22 ENCOUNTER — Ambulatory Visit (INDEPENDENT_AMBULATORY_CARE_PROVIDER_SITE_OTHER)
Admission: RE | Admit: 2017-12-22 | Discharge: 2017-12-22 | Disposition: A | Discharge status: Home / Self Care | Payer: Self-pay | Source: Ambulatory Visit | Attending: Internal Medicine | Admitting: Internal Medicine

## 2017-12-22 DIAGNOSIS — E7800 Pure hypercholesterolemia, unspecified: Secondary | ICD-10-CM

## 2017-12-22 LAB — EXERCISE TOLERANCE TEST
CHL RATE OF PERCEIVED EXERTION: 15
CSEPED: 12 min
CSEPEDS: 0 s
CSEPEW: 13.7 METS
CSEPHR: 97 %
MPHR: 185 {beats}/min
Peak HR: 181 {beats}/min
Rest HR: 99 {beats}/min

## 2017-12-24 ENCOUNTER — Telehealth (HOSPITAL_COMMUNITY): Payer: Self-pay

## 2017-12-24 NOTE — Telephone Encounter (Signed)
Pt called with results Calcium score 0 (great). Has some small post-inflammatory lung nodules -likely benign. Please refer to Dr. Delton Coombes [facility]) or Dr. Sung Amabile [facility]) to discuss. Pt asking if having a chest cold and being congested had anything to do with inflammatory lung nodules, message sent to MD, pt informed of normal stress test,

## 2017-12-27 ENCOUNTER — Telehealth (HOSPITAL_COMMUNITY): Payer: Self-pay

## 2017-12-27 NOTE — Telephone Encounter (Signed)
Pt called and informed  We can just repeat in 3-6 months, pt agreed and verbalized understanding

## 2018-02-02 ENCOUNTER — Ambulatory Visit (HOSPITAL_COMMUNITY)
Admission: RE | Admit: 2018-02-02 | Discharge: 2018-02-02 | Disposition: A | Discharge status: Home / Self Care | Payer: Commercial Managed Care - PPO | Source: Ambulatory Visit | Attending: Internal Medicine | Admitting: Internal Medicine

## 2018-02-02 VITALS — BP 140/80 | HR 87 | Wt 236.8 lb

## 2018-02-02 DIAGNOSIS — Z87891 Personal history of nicotine dependence: Secondary | ICD-10-CM | POA: Insufficient documentation

## 2018-02-02 DIAGNOSIS — Z79899 Other long term (current) drug therapy: Secondary | ICD-10-CM | POA: Insufficient documentation

## 2018-02-02 DIAGNOSIS — Z8249 Family history of ischemic heart disease and other diseases of the circulatory system: Secondary | ICD-10-CM | POA: Insufficient documentation

## 2018-02-02 DIAGNOSIS — E669 Obesity, unspecified: Secondary | ICD-10-CM | POA: Insufficient documentation

## 2018-02-02 DIAGNOSIS — E7800 Pure hypercholesterolemia, unspecified: Secondary | ICD-10-CM

## 2018-02-02 DIAGNOSIS — E781 Pure hyperglyceridemia: Secondary | ICD-10-CM | POA: Diagnosis present

## 2018-02-02 DIAGNOSIS — Z136 Encounter for screening for cardiovascular disorders: Secondary | ICD-10-CM | POA: Diagnosis not present

## 2018-02-02 DIAGNOSIS — R0683 Snoring: Secondary | ICD-10-CM | POA: Diagnosis not present

## 2018-02-02 NOTE — Progress Notes (Signed)
Cardiology Clinic Note     Primary Care: Gillis Santa PA-C [facility])   HPI:  Eric Lane is a 37 y.o. male with h/o obesity, hypertriglyceridemia and strong family h/o CAD.   Established 11/2017 for cardiac evaluation. No personal history of heart disease. Reported 30 lbs weight gain over 2 years. Lipids at that visit with LDL elevated, but TGs greatly improved.   He presents today for regular follow up. Says he is not sleeping very well due to a disk in his neck. Wife also says he is snoring a bunch. No CP or SOB. Not watching his diet very much. Trying to limit sweets.   Dad had LAD infarct in 06/2017 and got stent at [facility]  Reports he had echo done several years ago for heart murmur and was normal.   Calcium score 12/22/17: Zero ETT 12/22/17: Normal, no evidence of ischemia.   Review of systems complete and found to be negative unless listed in HPI.    PMHx: 1. Obesity 2. Hypertriglyceridemia  Current Outpatient Medications  Medication Sig Dispense Refill  . mometasone-formoterol (DULERA) 200-5 MCG/ACT AERO Inhale 2 puffs into the lungs 2 (two) times daily.    Marland Kitchen omeprazole (PRILOSEC) 40 MG capsule Take 1 capsule (40 mg total) by mouth daily. (Patient not taking: Reported on 02/02/2018) 30 capsule 3   No current facility-administered medications for this encounter.    No Known Allergies  Social History   Socioeconomic History  . Marital status: Married    Spouse name: Judeth Cornfield  . Number of children: 1  . Years of education: 39  . Highest education level: Not on file  Occupational History  . Occupation: Civil engineer, contracting: [employer]  Social Needs  . Financial resource strain: Not on file  . Food insecurity:    Worry: Not on file    Inability: Not on file  . Transportation needs:    Medical: Not on file    Non-medical: Not on file  Tobacco Use  . Smoking status: Former Smoker    Packs/day: 1.00    Years: 5.00    Pack years: 5.00   Types: Cigarettes    Last attempt to quit: 03/01/2005    Years since quitting: 12.9  . Smokeless tobacco: Current User    Types: Chew  Substance and Sexual Activity  . Alcohol use: Yes    Alcohol/week: 0.0 standard drinks    Comment: OCCASIONALLY  . Drug use: No  . Sexual activity: Not on file  Lifestyle  . Physical activity:    Days per week: Not on file    Minutes per session: Not on file  . Stress: Not on file  Relationships  . Social connections:    Talks on phone: Not on file    Gets together: Not on file    Attends religious service: Not on file    Active member of club or organization: Not on file    Attends meetings of clubs or organizations: Not on file    Relationship status: Not on file  . Intimate partner violence:    Fear of current or ex partner: Not on file    Emotionally abused: Not on file    Physically abused: Not on file    Forced sexual activity: Not on file  Other Topics Concern  . Not on file  Social History Narrative  . Not on file      Family History  Problem Relation Age of Onset  .  Hypertension Mother   . Hyperlipidemia Father   . Diabetes Maternal Grandmother   . COPD Maternal Grandmother   . Congestive Heart Failure Maternal Grandmother   . Cancer Maternal Grandfather   . Alzheimer's disease Paternal Grandmother   . Stomach cancer Paternal Grandfather    Father with MI at age 36.   Vitals:   02/02/18 1448 02/02/18 1450  BP: (!) 160/98 140/80  Pulse: 87   SpO2: 98%   Weight: 107.4 kg (236 lb 12.8 oz)    Wt Readings from Last 3 Encounters:  02/02/18 107.4 kg (236 lb 12.8 oz)  12/01/17 104.9 kg (231 lb 4 oz)  03/11/17 105.2 kg (232 lb)    PHYSICAL EXAM: General:  Well appearing. No resp difficulty HEENT: normal Neck: supple. no JVD. Carotids 2+ bilat; no bruits. No lymphadenopathy or thryomegaly appreciated. Cor: PMI nondisplaced. Regular rate & rhythm. No rubs, gallops or murmurs. Lungs: clear Abdomen: obese soft, nontender,  nondistended. No hepatosplenomegaly. No bruits or masses. Good bowel sounds. Extremities: no cyanosis, clubbing, rash, edema Neuro: alert & orientedx3, cranial nerves grossly intact. moves all 4 extremities w/o difficulty. Affect pleasant   ASSESSMENT & PLAN: 1. Cardiovascular risk screening - Calcium score 12/22/17 0 - ETT 12/22/17: Normal, no evidence of ischemia.  - we reviewed testing currently no evidence of vascular disease but severe significant RFs including very strong Fhx.  - Lipids stable 12/01/17 Cholesterol 186, TGs 129, HDL 42, LDL 118.  - Encouraged exercise and weight loss. Will need sleep study    2. Hypertriglyceridemia - TGs much improved at 129 12/01/17 as above.  - Encouraged exercise and weight loss.  - Have recommended [facility] Diet  3. Snoring - Likely sleep apnea - We discussed weight loss at last visit but he is actually up 5 pounds.  - Refer for sleep study   Arvilla Meresaniel Bensimhon, MD  3:27 PM

## 2018-02-02 NOTE — Addendum Note (Signed)
Encounter addended by: Nicole Cellaogers, Sierria Bruney A, RN on: 02/02/2018 3:36 PM  Actions taken: Sign clinical note

## 2018-02-02 NOTE — Addendum Note (Signed)
Encounter addended by: Nicole Cellaogers, Alley Neils A, RN on: 02/02/2018 3:35 PM  Actions taken: Order list changed, Diagnosis association updated

## 2018-02-02 NOTE — Patient Instructions (Signed)
Your physician has recommended that you have a sleep study. This test records several body functions during sleep, including: brain activity, eye movement, oxygen and carbon dioxide blood levels, heart rate and rhythm, breathing rate and rhythm, the flow of air through your mouth and nose, snoring, body muscle movements, and chest and belly movement.   Follow up with Dr. Mayford Knifeurner for the sleep study.  Follow up with Dr. Gala RomneyBensimhon in 12 months

## 2018-03-24 DIAGNOSIS — M4317 Spondylolisthesis, lumbosacral region: Secondary | ICD-10-CM | POA: Diagnosis not present

## 2018-03-28 NOTE — Progress Notes (Signed)
Patient: Eric Lane, Male    DOB: Apr 25, 1980, 38 y.o.   MRN: [id number] Visit Date: 03/30/2018  Today's Provider: Mar Daring, PA-C   Chief Complaint  Patient presents with  . Annual Exam   Subjective:    Annual physical exam BLADIMIR Lane is a 38 y.o. male who presents today for health maintenance and complete physical. He feels fairly well. He reports exercising yes/bike. He reports he is sleeping poorly. He does have a sleep study scheduled in the next month.   He also mentions having decreased ability to sustain an erection. He reports he is able to achieve an erection, but it is not as "large" and does not last. He reports this has been ongoing x 1 year. He does have a history of decreased testosterone as a historical diagnosis. He has never used testosterone supplements.   He also has chronic low back pain with radiculopathy down the left leg. He is followed by Dr. Weston Settle, [facility].   He and his wife are expecting their 2nd child, a daughter, in February. They have a 28 yr old son.  -----------------------------------------------------------------   Review of Systems  Constitutional: Negative.   HENT: Negative.   Eyes: Negative.   Respiratory: Negative.   Cardiovascular: Negative.   Gastrointestinal: Negative.   Endocrine: Negative.   Genitourinary: Negative.        Decreased erection  Musculoskeletal: Positive for back pain.  Allergic/Immunologic: Negative.   Neurological: Positive for numbness (left leg).  Hematological: Negative.   Psychiatric/Behavioral: Positive for sleep disturbance.    Social History      He  reports that he quit smoking about 13 years ago. His smoking use included cigarettes. He has a 5.00 pack-year smoking history. His smokeless tobacco use includes chew. He reports current alcohol use. He reports that he does not use drugs.       Social History   Socioeconomic History  . Marital status: Married    Spouse name:  [name]  . Number of children: 1  . Years of education: 60  . Highest education level: Not on file  Occupational History  . Occupation: Chemical engineer: [employer]  . Financial resource strain: Not on file  . Food insecurity:    Worry: Not on file    Inability: Not on file  . Transportation needs:    Medical: Not on file    Non-medical: Not on file  Tobacco Use  . Smoking status: Former Smoker    Packs/day: 1.00    Years: 5.00    Pack years: 5.00    Types: Cigarettes    Last attempt to quit: 03/01/2005    Years since quitting: 13.0  . Smokeless tobacco: Current User    Types: Chew  Substance and Sexual Activity  . Alcohol use: Yes    Alcohol/week: 0.0 standard drinks    Comment: OCCASIONALLY  . Drug use: No  . Sexual activity: Not on file  Lifestyle  . Physical activity:    Days per week: Not on file    Minutes per session: Not on file  . Stress: Not on file  Relationships  . Social connections:    Talks on phone: Not on file    Gets together: Not on file    Attends religious service: Not on file    Active member of club or organization: Not on file    Attends meetings of clubs or  organizations: Not on file    Relationship status: Not on file  Other Topics Concern  . Not on file  Social History Narrative  . Not on file    History reviewed. No pertinent past medical history.   Patient Active Problem List   Diagnosis Date Noted  . CN (constipation) 08/15/2014  . Elevated blood sugar 08/15/2014  . Brash 08/15/2014  . External hemorrhoid 08/15/2014  . Hypercholesteremia 08/15/2014  . Hypotestosteronism 08/15/2014  . Cardiac murmur 08/15/2014  . Awareness of heartbeats 08/15/2014  . Avitaminosis D 08/15/2014    Past Surgical History:  Procedure Laterality Date  . LASIK Bilateral 09/20/2014    Family History        Family Status  Relation Name Status  . Mother  Alive  . Father  Alive  . Sister  Alive  . MGM  Alive   . MGF  Deceased  . PGM  Deceased  . PGF  Deceased        His family history includes Alzheimer's disease in his paternal grandmother; COPD in his maternal grandmother; Cancer in his maternal grandfather; Congestive Heart Failure in his maternal grandmother; Diabetes in his maternal grandmother; Heart attack in his father; Hyperlipidemia in his father; Hypertension in his mother; Stomach cancer in his paternal grandfather.      No Known Allergies   Current Outpatient Medications:  .  mometasone-formoterol (DULERA) 200-5 MCG/ACT AERO, Inhale 2 puffs into the lungs 2 (two) times daily., Disp: , Rfl:  .  omeprazole (PRILOSEC) 40 MG capsule, Take 1 capsule (40 mg total) by mouth daily., Disp: 30 capsule, Rfl: 3   Patient Care Team: Rubye Beach as PCP - General (Physician Assistant)      Objective:   Vitals: BP (!) 152/98 (BP Location: Right Arm, Cuff Size: Large)   Pulse (!) 103   Temp 98.2 F (36.8 C) (Oral)   Resp 16   Ht '5\' 11"'$  (1.803 m)   Wt 238 lb (108 kg)   SpO2 98%   BMI 33.19 kg/m    Vitals:   03/30/18 0811 03/30/18 0817  BP: (!) 159/100 (!) 152/98  Pulse: 92 (!) 103  Resp: 16   Temp: 98.2 F (36.8 C)   TempSrc: Oral   SpO2: 98%   Weight: 238 lb (108 kg)   Height: '5\' 11"'$  (1.803 m)      Physical Exam Constitutional:      General: He is not in acute distress.    Appearance: Normal appearance. He is well-developed. He is obese. He is not diaphoretic.  HENT:     Head: Normocephalic and atraumatic.     Right Ear: Tympanic membrane, ear canal and external ear normal.     Left Ear: Tympanic membrane, ear canal and external ear normal.     Nose: Nose normal.  Eyes:     General:        Right eye: No discharge.     Conjunctiva/sclera: Conjunctivae normal.     Pupils: Pupils are equal, round, and reactive to light.  Neck:     Musculoskeletal: Normal range of motion and neck supple.     Thyroid: No thyromegaly.     Vascular: No carotid bruit.      Trachea: No tracheal deviation.  Cardiovascular:     Rate and Rhythm: Normal rate and regular rhythm.     Heart sounds: Normal heart sounds. No murmur.  Pulmonary:     Effort: Pulmonary effort is normal. No respiratory  distress.     Breath sounds: Normal breath sounds. No wheezing or rales.  Chest:     Chest wall: No tenderness.  Abdominal:     General: There is no distension.     Palpations: Abdomen is soft. There is no mass.     Tenderness: There is no abdominal tenderness. There is no guarding or rebound.  Genitourinary:    Comments: deferred Musculoskeletal: Normal range of motion.        General: No tenderness.  Lymphadenopathy:     Cervical: No cervical adenopathy.  Skin:    General: Skin is warm and dry.     Findings: No erythema or rash.  Neurological:     Mental Status: He is alert and oriented to person, place, and time.     Cranial Nerves: No cranial nerve deficit.     Motor: No abnormal muscle tone.     Coordination: Coordination normal.     Deep Tendon Reflexes: Reflexes are normal and symmetric. Reflexes normal.  Psychiatric:        Behavior: Behavior normal.        Thought Content: Thought content normal.        Judgment: Judgment normal.      Depression Screen PHQ 2/9 Scores 03/30/2018 03/11/2017 02/05/2017 09/24/2014  PHQ - 2 Score 0 0 0 0  PHQ- 9 Score 2 - 0 -      Assessment & Plan:     Routine Health Maintenance and Physical Exam  Exercise Activities and Dietary recommendations Goals   None     Immunization History  Administered Date(s) Administered  . DTaP 10/10/1987  . IPV 10/10/1987  . Influenza,inj,Quad PF,6+ Mos 12/11/2015, 01/16/2018  . MMR 06/04/2000  . Td 11/22/2013  . Tdap 11/22/2013    Health Maintenance  Topic Date Due  . HIV Screening  02/11/1997  . TETANUS/TDAP  11/23/2023  . INFLUENZA VACCINE  Completed     Discussed health benefits of physical activity, and encouraged him to engage in regular exercise appropriate  for his age and condition.    1. Annual physical exam Normal physical exam today. Will check labs as below and f/u pending lab results. If labs are stable and WNL he will not need to have these rechecked for one year at his next annual physical exam. He is to call the office in the meantime if he has any acute issue, questions or concerns. - CBC w/Diff/Platelet - Comprehensive Metabolic Panel (CMET) - TSH - Lipid Profile  2. Avitaminosis D H/O this with fatigue and weight gain. Will check labs as below and f/u pending results. - CBC w/Diff/Platelet - Comprehensive Metabolic Panel (CMET) - Vitamin D (25 hydroxy)  3. Hypercholesteremia H/O this with family history of CAD in father. Was followed by Dr. Haroldine Laws, Cardiology. Will check labs as below and f/u pending results. - CBC w/Diff/Platelet - Comprehensive Metabolic Panel (CMET) - Lipid Profile  4. Class 1 obesity due to excess calories with serious comorbidity and body mass index (BMI) of 33.0 to 33.9 in adult Counseled patient on healthy lifestyle modifications including dieting and exercise.  Will check labs as below and f/u pending results. - CBC w/Diff/Platelet - Comprehensive Metabolic Panel (CMET) - TSH - Lipid Profile  5. Hypotestosteronism H/O this with worsening symptoms. Will check labs as below and f/u pending results. - Testosterone  6. Essential hypertension Elevated today in the office. Patient reports BP normally runs around 130/80s at home. Will monitor.   7. Difficulty sleeping  Has sleep study scheduled in February.   --------------------------------------------------------------------    Mar Daring, PA-C  [facility]

## 2018-03-30 ENCOUNTER — Encounter: Payer: Self-pay | Admitting: Physician Assistant

## 2018-03-30 ENCOUNTER — Ambulatory Visit (INDEPENDENT_AMBULATORY_CARE_PROVIDER_SITE_OTHER): Payer: Commercial Managed Care - PPO | Admitting: Physician Assistant

## 2018-03-30 VITALS — BP 152/98 | HR 103 | Temp 98.2°F | Resp 16 | Ht 71.0 in | Wt 238.0 lb

## 2018-03-30 DIAGNOSIS — Z6833 Body mass index (BMI) 33.0-33.9, adult: Secondary | ICD-10-CM | POA: Diagnosis not present

## 2018-03-30 DIAGNOSIS — G479 Sleep disorder, unspecified: Secondary | ICD-10-CM

## 2018-03-30 DIAGNOSIS — E7800 Pure hypercholesterolemia, unspecified: Secondary | ICD-10-CM | POA: Diagnosis not present

## 2018-03-30 DIAGNOSIS — E349 Endocrine disorder, unspecified: Secondary | ICD-10-CM | POA: Diagnosis not present

## 2018-03-30 DIAGNOSIS — E6609 Other obesity due to excess calories: Secondary | ICD-10-CM

## 2018-03-30 DIAGNOSIS — Z0000 Encounter for general adult medical examination without abnormal findings: Secondary | ICD-10-CM

## 2018-03-30 DIAGNOSIS — E559 Vitamin D deficiency, unspecified: Secondary | ICD-10-CM | POA: Diagnosis not present

## 2018-03-30 DIAGNOSIS — I10 Essential (primary) hypertension: Secondary | ICD-10-CM

## 2018-03-30 NOTE — Patient Instructions (Signed)

## 2018-03-31 LAB — COMPREHENSIVE METABOLIC PANEL
A/G RATIO: 1.8 (ref 1.2–2.2)
ALT: 50 IU/L — AB (ref 0–44)
AST: 29 IU/L (ref 0–40)
Albumin: 4.7 g/dL (ref 4.0–5.0)
Alkaline Phosphatase: 81 IU/L (ref 39–117)
BUN/Creatinine Ratio: 15 (ref 9–20)
BUN: 18 mg/dL (ref 6–20)
Bilirubin Total: 0.6 mg/dL (ref 0.0–1.2)
CHLORIDE: 100 mmol/L (ref 96–106)
CO2: 22 mmol/L (ref 20–29)
Calcium: 9.9 mg/dL (ref 8.7–10.2)
Creatinine, Ser: 1.23 mg/dL (ref 0.76–1.27)
GFR calc Af Amer: 87 mL/min/{1.73_m2} (ref >59)
GFR calc non Af Amer: 75 mL/min/{1.73_m2} (ref >59)
GLUCOSE: 91 mg/dL (ref 65–99)
Globulin, Total: 2.6 g/dL (ref 1.5–4.5)
POTASSIUM: 4.4 mmol/L (ref 3.5–5.2)
Sodium: 140 mmol/L (ref 134–144)
Total Protein: 7.3 g/dL (ref 6.0–8.5)

## 2018-03-31 LAB — LIPID PANEL
CHOLESTEROL TOTAL: 196 mg/dL (ref 100–199)
Chol/HDL Ratio: 5 ratio (ref 0.0–5.0)
HDL: 39 mg/dL — AB (ref >39)
LDL Calculated: 125 mg/dL — ABNORMAL HIGH (ref 0–99)
TRIGLYCERIDES: 160 mg/dL — AB (ref 0–149)
VLDL Cholesterol Cal: 32 mg/dL (ref 5–40)

## 2018-03-31 LAB — TSH: TSH: 1.37 u[IU]/mL (ref 0.450–4.500)

## 2018-03-31 LAB — CBC WITH DIFFERENTIAL/PLATELET
BASOS ABS: 0.1 10*3/uL (ref 0.0–0.2)
BASOS: 1 %
EOS (ABSOLUTE): 0.1 10*3/uL (ref 0.0–0.4)
Eos: 2 %
Hematocrit: 43.7 % (ref 37.5–51.0)
Hemoglobin: 15.2 g/dL (ref 13.0–17.7)
IMMATURE GRANULOCYTES: 1 %
Immature Grans (Abs): 0 10*3/uL (ref 0.0–0.1)
LYMPHS: 29 %
Lymphocytes Absolute: 1.9 10*3/uL (ref 0.7–3.1)
MCH: 28.8 pg (ref 26.6–33.0)
MCHC: 34.8 g/dL (ref 31.5–35.7)
MCV: 83 fL (ref 79–97)
MONOS ABS: 0.5 10*3/uL (ref 0.1–0.9)
Monocytes: 7 %
NEUTROS PCT: 60 %
Neutrophils Absolute: 3.9 10*3/uL (ref 1.4–7.0)
PLATELETS: 238 10*3/uL (ref 150–450)
RBC: 5.27 x10E6/uL (ref 4.14–5.80)
RDW: 13.1 % (ref 11.6–15.4)
WBC: 6.4 10*3/uL (ref 3.4–10.8)

## 2018-03-31 LAB — VITAMIN D 25 HYDROXY (VIT D DEFICIENCY, FRACTURES): Vit D, 25-Hydroxy: 22.6 ng/mL — ABNORMAL LOW (ref 30.0–100.0)

## 2018-03-31 LAB — TESTOSTERONE: Testosterone: 193 ng/dL — ABNORMAL LOW (ref 264–916)

## 2018-04-01 ENCOUNTER — Other Ambulatory Visit: Payer: Self-pay | Admitting: Physician Assistant

## 2018-04-01 ENCOUNTER — Other Ambulatory Visit: Payer: Self-pay | Admitting: Neurosurgery

## 2018-04-01 DIAGNOSIS — M4317 Spondylolisthesis, lumbosacral region: Secondary | ICD-10-CM

## 2018-04-01 DIAGNOSIS — E349 Endocrine disorder, unspecified: Secondary | ICD-10-CM

## 2018-04-01 NOTE — Progress Notes (Signed)
Testosterone ordered for confirmation of low testosterone

## 2018-04-09 ENCOUNTER — Ambulatory Visit
Admission: RE | Admit: 2018-04-09 | Discharge: 2018-04-09 | Disposition: A | Discharge status: Home / Self Care | Payer: Commercial Managed Care - PPO | Source: Ambulatory Visit | Attending: Neurosurgery | Admitting: Neurosurgery

## 2018-04-09 DIAGNOSIS — M4317 Spondylolisthesis, lumbosacral region: Secondary | ICD-10-CM | POA: Diagnosis not present

## 2018-04-09 DIAGNOSIS — M4807 Spinal stenosis, lumbosacral region: Secondary | ICD-10-CM | POA: Diagnosis not present

## 2018-04-21 DIAGNOSIS — M4317 Spondylolisthesis, lumbosacral region: Secondary | ICD-10-CM | POA: Diagnosis not present

## 2018-04-22 ENCOUNTER — Ambulatory Visit (HOSPITAL_BASED_OUTPATIENT_CLINIC_OR_DEPARTMENT_OTHER): Payer: Commercial Managed Care - PPO | Attending: Internal Medicine | Admitting: Cardiology

## 2018-04-22 DIAGNOSIS — R0683 Snoring: Secondary | ICD-10-CM | POA: Insufficient documentation

## 2018-04-25 NOTE — Procedures (Signed)
   Patient Name: Eric Lane, Eric Lane Date:02/16/2017 04/22/2018 Gender: Male D.O.B: [birth year] Age (years): 37 Referring Provider: Bevelyn Buckles Bensimhon Height (inches): 71 Interpreting Physician: Armanda Magic MD, ABSM Weight (lbs): 227 RPSGT: Rolene Arbour BMI: 32 MRN: [id number] Neck Size: 17.00  CLINICAL INFORMATION  Sleep Study Type: NPSG  Indication for sleep study: Snoring  Epworth Sleepiness Score: 15  SLEEP STUDY TECHNIQUE  As per the AASM Manual for the Scoring of Sleep and Associated Events v2.3 (April 2016) with a hypopnea requiring 4% desaturations. The channels recorded and monitored were frontal, central and occipital EEG, electrooculogram (EOG), submentalis EMG (chin), nasal and oral airflow, thoracic and abdominal wall motion, anterior tibialis EMG, snore microphone, electrocardiogram, and pulse oximetry.  MEDICATIONS  Medications self-administered by patient taken the night of the study : N/A  SLEEP ARCHITECTURE  The study was initiated at 10:13:51 PM and ended at 4:43:30 AM. Sleep onset time was 16.3 minutes and the sleep efficiency was 82.6%%. The total sleep time was 321.8 minutes. Stage REM latency was 93.5 minutes. The patient spent 2.5%% of the night in stage N1 sleep, 62.1% in stage N2 sleep, 17.1% in stage N3 and 18.3% in REM. Alpha intrusion was absent. Supine sleep was 15.54%.  RESPIRATORY PARAMETERS  The overall apnea/hypopnea index (AHI) was 0.2 per hour. There were 0 total apneas, including 0 obstructive, 0 central and 0 mixed apneas. There were 1 hypopneas and 2 RERAs. The AHI during Stage REM sleep was 1.0 per hour. AHI while supine was 0.0 per hour. The mean oxygen saturation was 94.8%. The minimum SpO2 during sleep was 91.0%. soft snoring was noted during this study.  CARDIAC DATA  The 2 lead EKG demonstrated sinus rhythm. The mean heart rate was 54.1 beats per minute. Other EKG findings include: None.  LEG MOVEMENT DATA  The total PLMS  were 0 with a resulting PLMS index of 0.0. Associated arousal with leg movement index was 0.4 .  IMPRESSIONS  - No significant obstructive sleep apnea occurred during this study (AHI = 0.2/h). - No significant central sleep apnea occurred during this study (CAI = 0.0/h). - The patient had minimal or no oxygen desaturation during the study (Min O2 = 91.0%) - The patient snored with soft snoring volume. - No cardiac abnormalities were noted during this study. - Clinically significant periodic limb movements did not occur during sleep. No significant associated arousals.  DIAGNOSIS  - Normal Study  RECOMMENDATIONS  - Avoid alcohol, sedatives and other CNS depressants that may worsen sleep apnea and disrupt normal sleep architecture. - Sleep hygiene should be reviewed to assess factors that may improve sleep quality. - Weight management and regular exercise should be initiated or continued if appropriate.  [Electronically signed] 04/25/2018 11:39 AM  Armanda Magic MD, ABSM Diplomate, American Board of Sleep Medicine

## 2018-04-27 ENCOUNTER — Ambulatory Visit: Payer: Commercial Managed Care - PPO | Attending: Neurosurgery

## 2018-04-27 ENCOUNTER — Telehealth: Payer: Self-pay | Admitting: *Deleted

## 2018-04-27 DIAGNOSIS — M5442 Lumbago with sciatica, left side: Secondary | ICD-10-CM | POA: Insufficient documentation

## 2018-04-27 DIAGNOSIS — G8929 Other chronic pain: Secondary | ICD-10-CM | POA: Diagnosis not present

## 2018-04-27 NOTE — Telephone Encounter (Signed)
-----   Message from Quintella Reichert, MD sent at 04/25/2018 11:41 AM EST ----- Please let patient know that sleep study showed no significant sleep apnea.

## 2018-04-27 NOTE — Patient Instructions (Signed)
Access Code: [email or web address]  URL: [email or web address]  Date: 04/27/2018  Prepared by: Ria Comment   Exercises  Supine Double Knee to Chest - 3 reps - 45-60 seconds hold - 2x daily - 7x weekly  Child's Pose Stretch - 3 reps - 30-45 seconds hold - 2x daily - 7x weekly  Standard Plank - 5 reps - 2 sets - 10-15 seconds hold - 1x daily - 7x weekly

## 2018-04-27 NOTE — Telephone Encounter (Signed)
Informed patient of sleep study results and patient understanding was verbalized. Patient understands his sleep study showed showed no significant sleep apnea.  Pt is aware and agreeable to normal results. 

## 2018-04-27 NOTE — Therapy (Addendum)
[facility] MAIN [facility] SERVICES [address] [place], [place], [address] Phone: [phone]   Fax:  [phone]  Physical Therapy Evaluation  Patient Details  Name: Eric Lane MRN: [id number] Date of Birth: [birth year] Referring Provider (PT): Dr. Wynetta Emery   Encounter Date: 04/27/2018  PT End of Session - 05/01/18 2024    Visit Number  1    Number of Visits  17    Date for PT Re-Evaluation  06/22/18    PT Start Time  1520    PT Stop Time  1605    PT Time Calculation (min)  45 min    Activity Tolerance  Patient tolerated treatment well    Behavior During Therapy  [facility] for tasks assessed/performed       History reviewed. No pertinent past medical history.  Past Surgical History:  Procedure Laterality Date  . LASIK Bilateral 09/20/2014    There were no vitals filed for this visit.  [facility] PT Assessment - 05/01/18 2022      Assessment   Medical Diagnosis  Spondylolisthesis    Referring Provider (PT)  Dr. Wynetta Emery    Onset Date/Surgical Date  10/31/16   Approximate   Next MD Visit  Not reported    Prior Therapy  Yes, two prior bouts of therapy with temporary relief      Precautions   Precautions  None    Precaution Comments  L5/S1 grade I anterolistheis with bilateral pars defect      Restrictions   Weight Bearing Restrictions  No      Balance Screen   Has the patient fallen in the past 6 months  No    Has the patient had a decrease in activity level because of a fear of falling?   No    Is the patient reluctant to leave their home because of a fear of falling?   No      Home Environment   Living Environment  Private residence    Living Arrangements  Spouse/significant other;Children    Available Help at Discharge  Family    Type of Home  House    Home Access  Stairs to enter      Prior Function   Level of Independence  Independent    Vocation  Full time employment    Vocation Requirements  Works at [employer] in  [employer], golf      Cognition   Overall Cognitive Status  Within Functional Limits for tasks assessed      Observation/Other Assessments   Other Surveys   Other Surveys    Modified [place]  42%         Subjective Assessment - 05/01/18 2020    Subjective  Low back pain    Pertinent History  Pt referred for low back pain. He was having intermittent low back pain for multiple years which was managed with gentle massage and rest. He saw a PT in [place] and did some strengthening, TENS, and stretches. He saw her for 3-4 times (once/week to once every other week). Pt reports short duration relief for approximately 1 day and then symptoms returned. He saw another PT who thought that maybe it was an issue with his hip. This therapist performed dry needling and manual therapy to his lumbar spine and L hip but he did not experience any improvement. He saw Joycelyn Man PA-C who ordered radiographs and then referred him to [facility]. MRI showed L5 on S1 grade I anterolithesis with bilateral pars defect as well as bilateral L5 foraminal nerve root compression. They performed a epidural without any improvement. He saw a neurosurgeon who recommended that he return to [facility] for a facet joint injection. Pt had that injection and he states that offered some relief for a couple days and then the symptoms returned. Pt reports that over the course of the last year his symptoms have worsened. He had a repeat MRI on his lumbar spine without any significant changes.  Pt reports that the pain is in the top of his L buttock. There are times when he feels like his hamstring is really tight and like it is going to "burst." Pt states that he gets the pain all the way down to his ankle as well. He gets pins and needles into his L leg but he denies any numbness/tingling. No symptoms on RLE. He denies any LLE weakness. Intermittently the pain wakes him up at night if he lays  on his L side. Pt recently had a sleep study but has not received the results yet. Otherwise no recent changes to his health. Pt denies any saddle paresthesia or bowel/bladder incontinence. He has a history of low testosterone and per medical record has recently had difficulty with erectile function. Pt denies any relation to his low back pain.    Limitations  Standing;Walking    Diagnostic tests  Low back MRI x 2, see history    Patient Stated Goals  Decrease pain and improve core strength    Currently in Pain?  Yes    Pain Score  3    Worst: 6/10, Best: 0/10   Pain Location  Hip    Pain Orientation  Left;Posterior;Upper    Pain Descriptors / Indicators  Burning;Throbbing    Pain Type  Chronic pain    Pain Radiating Towards  Down the back of LLE to the knee and occasionally to the foot    Pain Onset  More than a month ago    Pain Frequency  Intermittent    Aggravating Factors   worst first thing in the morning as well as at the end of the day, worse after standing for long perior and walking long distances    Pain Relieving Factors  riding bike (initial relief and then tightens), sitting in his chair at work, heat (heat pack and hot shower), ibuprofen, no improvement with muscle relaxers        SUBJECTIVE Chief complaint: Pt referred for low back pain. He was having intermittent low back pain for multiple years which was managed with gentle massage and rest. He saw a PT in [place] and did some strengthening, TENS, and stretches. He saw her for 3-4 times (once/week to once every other week). Pt reports short duration relief for approximately 1 day and then symptoms returned. He saw another PT who thought that maybe it was an issue with his hip. This therapist performed dry needling and manual therapy to his lumbar spine and L hip but he did not experience any improvement. He saw Joycelyn Man PA-C who ordered radiographs and then referred him to [facility]. MRI showed L5 on S1  grade I anterolithesis with bilateral pars defect as well as bilateral L5 foraminal nerve root compression. They performed a epidural without any improvement. He saw a neurosurgeon who recommended that he return to [facility] for a facet joint injection. Pt had that injection and he states that  offered some relief for a couple days and then the symptoms returned. Pt reports that over the course of the last year his symptoms have worsened. He had a repeat MRI on his lumbar spine without any significant changes.  Pt reports that the pain is in the top of his L buttock. There are times when he feels like his hamstring is really tight and like it is going to "burst." Pt states that he gets the pain all the way down to his ankle as well. He gets pins and needles into his L leg but he denies any numbness/tingling. No symptoms on RLE. He denies any LLE weakness. Intermittently the pain wakes him up at night if he lays on his L side. Pt recently had a sleep study but has not received the results yet. Otherwise no recent changes to his health. Pt denies any saddle paresthesia or bowel/bladder incontinence. He has a history of low testosterone and per medical record has recently had difficulty with erectile function. Pt denies any relation to his low back pain.    Onset:  Referring Dx: spondylolisthesis    MD: Dr. Wynetta Emery  Pain: 3/10 Present, 0/10 Best, 6/10 Worst: Aggravating factors: worst first in the morning and then at end of day, standing for long periods, walking long distances Easing factors: riding his bike (initial relief and then tightens), sitting in his chair at work, heat (heat pack and hot shower), ibuprofen, no improvement with muscle relaxers.   24 hour pain behavior: Worse in the morning Recent back trauma: No Prior history of back injury or pain: Yes Pain quality: pain quality: burning Radiating pain: Yes  Numbness/Tingling: Yes Follow-up appointment with MD: Yes but date not  reported Imaging: Yes , see history   OBJECTIVE  Mental Status Patient is oriented to person, place and time.  Recent memory is intact.  Remote memory is intact.  Attention span and concentration are intact.  Expressive speech is intact.  Patient's fund of knowledge is within normal limits for educational level.  SENSATION: Grossly intact to light touch bilateral LEs as determined by testing dermatomes L2-S2 Proprioception and hot/cold testing deferred on this date  REFLEX: 2+ knee jerk bilatearlly 1+ ankle jerk RLE, 0 ankle jerk LLE   MUSCULOSKELETAL: Tremor: None Bulk: Normal Tone: Normal  Posture:  Flat low back with decreased lordosis. Decreased segmental extension, normal flexion. Pt with R lateral thoracic/lumbar shift in standing. Repeated lateral shift correction appears to peripheralize symptoms. Mild Instability noted in single leg stance on LLE compared to RLE.   Gait: No gross abnormalities noted, no excessive pronation noted bilaterally. Mild decrease in trunk rotation and arm swing.    Palpation: Unable to reproduce pain today with palpation except some mild pain with palpation of superior fibers of glut med/max on the left   Strength (out of 5) R/L 5/5 Hip flexion 5/5 Hip ER 5/5 Hip IR 4+/4+ Hip abduction 4/4- Hip adduction 4/4 Hip extension* L low back pain with R hip AROM extension 5/5 Knee extension 5/5 Knee flexion 5/5 Ankle dorsiflexion 5/4 Great toe extension *Indicates pain   AROM (degrees) R/L (all movements include overpressure unless otherwise stated) Lumbar forward flexion: Grossly WNL with pain returning to neutral from fully flexed position Lumbar extension: Grossly limited and painful. Very poor segmental mobility noted. Lumbar lateral flexion: R: WNL and painless  L: limited and painful Lumbar rotation: WNL but painful to the R   Repeated Movements Pain and peripheralization with repeated extension. Pain when returning  to neutral  (extending) during repeated flexion, difficult to assess if pain centralizes or not.    Passive Accessory Intervertebral Motion (PAIVM) Pt denies concordant reproduction of posterior hip or back pain with CPA L1-L5 and UPA bilaterally L1-L5. CPA central and L are slightly tender however especially in lower lumbar segments L4/5. Generally hypomobile throughout  Muscle Length Hamstrings: Significantly decreased bilaterally; [place]: Decreased quad length bilaterally, no reproduction of back pain Ober: R: Not examined L: Not examined    SPECIAL TESTS Lumbar quadrant: R: Not examined L: Not examined Slump: R: Negative L: Negative SLR: R: Negative L:  Positive at 60 degrees Crossed SLR: R: Negative L: Negative FABER: R: Negative L: Negative FADIR: R: Negative L: Negative  Hip scour: R: Negative L: Negative Thomas: R: Not examined L: Not examined Ober: R: Not examined L: Not examined Forward Step-Down Test: R: Not examined L: Not examined Lateral Step-Down Test: R: Not examined L: Not examined Deep Squat: Not examined     Objective measurements completed on examination: See above findings.       Mechanical Traction Pt placed on mechanical traction in supine with knee resting on bench and lumbar spine mildly flexed. Performed mechanical spinal traction 100# pull x 30s, 60# rest x 10s, 15 minutes total. Pt unclear of effect immediately following.         PT Short Term Goals - 05/01/18 2041      PT SHORT TERM GOAL #1   Title  Pt will be independent with HEP in order to improve strength and decrease back pain in order to improve pain-free function at home and work.     Time  4    Period  Weeks    Status  New    Target Date  05/25/18                     PT Long Term Goals - 05/01/18 2042      PT LONG TERM GOAL #1   Title  Pt will decrease mODI scoreby at least 13 points in order demonstrate clinically significant reduction in back pain/disability.    Baseline   04/27/18: 42%    Time  8    Period  Weeks    Status  New    Target Date  06/22/18      PT LONG TERM GOAL #2   Title  Pt will decrease worst back pain as reported on NPRS by at least 2 points in order to demonstrate clinically significant reduction in back pain.     Baseline  04/27/18: worst: 6/10    Time  8    Period  Weeks    Status  New    Target Date  06/22/18      PT LONG TERM GOAL #3   Title  Pt will be able to perform R hip extension in prone without increase in low back pain in order to resume exercises to increase hip extension strength without increase in low back pain    Baseline  04/27/18: R hip extension increases L low back pain    Time  8    Period  Weeks    Status  New    Target Date  06/22/18             Plan - 05/01/18 2040    Clinical Impression Statement  Pt is a pleasant 38 year-old male referred for spondylolisthesis with pain in low back, L posterior hip, and radiating down  into LLE. MRI showed L5 on S1 grade I anterolithesis with bilateral pars defect as well as bilateral L5 foraminal nerve root compression. Pt has attempted physical therapy twice without any long-term relief. He has also had epidural and facet joint injections without lasting relief. Per patient his neurosurgeon is considering a fusion but would like for patient to have another attempt to manage symptoms conservatively with physical therapy. PT examination reveals R lateral shift in standing with worsening of his low back pain and peripheralization of his symptoms with attempted manual shift correction. He has deficits in lumbar segmental extension with pain reproduction during active extension from neutral as well as when returning from a forward flexed position. In addition pt with pain during L lateral lumbar flexion and R rotation. Positive SLR on the L at 60 degrees. Weakness with bilateral hip extension and reproduction of left low back pain with attempted strength testing of R hip extension  in prone. Absent ankle jerk reflex on the left and 1+ reflex on the right. Decreased L great toe extension strength as well as instability in single leg stance on LLE. Pt presents with deficits in strength, mobility, range of motion, and pain which all support his current findings of L5 nerve root compression. Pt will benefit from skilled PT services to address deficits and return to pain-free function at home and work.    History and Personal Factors relevant to plan of care:  Moderate (evolving): 1-2 personal factors/comorbidities, 3 or more body systems/activity limitations/participation restrictions      Clinical Presentation  Unstable    Clinical Presentation due to:  --    Clinical Decision Making  Moderate    Rehab Potential  Fair    PT Frequency  2x / week    PT Duration  8 weeks    PT Treatment/Interventions  ADLs/Self Care Home Management;Biofeedback;Aquatic Therapy;Canalith Repostioning;Cryotherapy;Electrical Stimulation;Iontophoresis 4mg /ml Dexamethasone;Moist Heat;Traction;Ultrasound;DME Instruction;Gait training;Stair training;Functional mobility training;Therapeutic activities;Therapeutic exercise;Balance training;Neuromuscular re-education;Patient/family education;Manual techniques;Passive range of motion;Dry needling;Energy conservation;Taping;Vestibular    PT Next Visit Plan  Review and progress HEP, traction (consider prone if supine inneffective), abdominal and low back strengthening    PT Home Exercise Plan  Planks and double knee to chest or prayer pose    Consulted and Agree with Plan of Care  Patient       Patient will benefit from skilled therapeutic intervention in order to improve the following deficits and impairments:  Pain, Postural dysfunction, Decreased strength, Difficulty walking  Visit Diagnosis: Chronic left-sided low back pain with left-sided sciatica     Problem List Patient Active Problem List   Diagnosis Date Noted  . Essential hypertension 03/30/2018   . CN (constipation) 08/15/2014  . Elevated blood sugar 08/15/2014  . GERD (gastroesophageal reflux disease) 08/15/2014  . External hemorrhoid 08/15/2014  . Hypercholesteremia 08/15/2014  . Hypotestosteronism 08/15/2014  . Cardiac murmur 08/15/2014  . Awareness of heartbeats 08/15/2014  . Avitaminosis D 08/15/2014   Lynnea Maizes PT, DPT, GCS  Kepler Mccabe 05/01/2018, 9:03 PM  [facility] MAIN [facility] SERVICES [address] [place], [place], [address] Phone: [phone]   Fax:  [phone]  Name: Eric Lane MRN: [id number] Date of Birth: [birth year]

## 2018-05-01 NOTE — Addendum Note (Signed)
Addended by: Ria Comment D on: 05/01/2018 09:05 PM   Modules accepted: Orders

## 2018-05-04 ENCOUNTER — Ambulatory Visit: Payer: Commercial Managed Care - PPO | Attending: Neurosurgery

## 2018-05-04 DIAGNOSIS — M5442 Lumbago with sciatica, left side: Secondary | ICD-10-CM | POA: Insufficient documentation

## 2018-05-04 DIAGNOSIS — G8929 Other chronic pain: Secondary | ICD-10-CM | POA: Insufficient documentation

## 2018-05-04 NOTE — Therapy (Signed)
[facility] MAIN [facility] SERVICES [address] [place], [place], [address] Phone: [phone]   Fax:  [phone]  Physical Therapy Treatment  Patient Details  Name: Eric Lane MRN: [id number] Date of Birth: 12/10/1980 Referring Provider (PT): Dr. Wynetta Emery   Encounter Date: 05/04/2018  PT End of Session - 05/04/18 1655    Visit Number  2    Number of Visits  17    Date for PT Re-Evaluation  06/22/18    PT Start Time  1600    PT Stop Time  1644    PT Time Calculation (min)  44 min    Activity Tolerance  Patient tolerated treatment well    Behavior During Therapy  [facility] for tasks assessed/performed       History reviewed. No pertinent past medical history.  Past Surgical History:  Procedure Laterality Date  . LASIK Bilateral 09/20/2014    There were no vitals filed for this visit.  Subjective Assessment - 05/04/18 1652    Subjective  Patient reports he had no positive or negative effects from the distraction. Has been feeling better doing the flexion based HEP. Has been having difficulty sleeping due to newborn.     Pertinent History  Pt referred for low back pain. He was having intermittent low back pain for multiple years which was managed with gentle massage and rest. He saw a PT in [place] and did some strengthening, TENS, and stretches. He saw her for 3-4 times (once/week to once every other week). Pt reports short duration relief for approximately 1 day and then symptoms returned. He saw another PT who thought that maybe it was an issue with his hip. This therapist performed dry needling and manual therapy to his lumbar spine and L hip but he did not experience any improvement. He saw Joycelyn Man PA-C who ordered radiographs and then referred him to [facility]. MRI showed L5 on S1 grade I anterolithesis with bilateral pars defect as well as bilateral L5 foraminal nerve root compression. They performed a epidural without any  improvement. He saw a neurosurgeon who recommended that he return to [facility] for a facet joint injection. Pt had that injection and he states that offered some relief for a couple days and then the symptoms returned. Pt reports that over the course of the last year his symptoms have worsened. He had a repeat MRI on his lumbar spine without any significant changes.  Pt reports that the pain is in the top of his L buttock. There are times when he feels like his hamstring is really tight and like it is going to "burst." Pt states that he gets the pain all the way down to his ankle as well. He gets pins and needles into his L leg but he denies any numbness/tingling. No symptoms on RLE. He denies any LLE weakness. Intermittently the pain wakes him up at night if he lays on his L side. Pt recently had a sleep study but has not received the results yet. Otherwise no recent changes to his health. Pt denies any saddle paresthesia or bowel/bladder incontinence. He has a history of low testosterone and per medical record has recently had difficulty with erectile function. Pt denies any relation to his low back pain.    Limitations  Standing;Walking    Diagnostic tests  Low back MRI x 2, see history    Patient Stated Goals  Decrease pain and improve core strength    Currently in  Pain?  Yes    Pain Score  6     Pain Location  Back    Pain Orientation  Left;Posterior;Lower;Medial    Pain Descriptors / Indicators  Aching;Throbbing;Burning    Pain Type  Chronic pain    Pain Radiating Towards  down the back of LLE to the knee     Pain Onset  More than a month ago    Pain Frequency  Intermittent         Supine: Hamstring stretch LLE 60 seconds with leg on PT shoulder slow increase in muscle tissue lengthening with repetition Nerve glides in SLR on PT shoulder 20x via dorsiflexion and plantarflexion LLE Distraction LLE: SAD via belt in inferior direction via hooklying position 6x 15 second holds.  LAD via belt in inferior distraction with varying arc of motion 6x 15 seconds.  TrA activation in hooklying: 10x 3 second holds TrA activation with LLE hooklying fallout, return to center with exhale/core activation 10x. Challenging for patient Figure 4 stretch no pain 60 seconds IT band straight leg stretch: painful, nonpainful with knee bent.   Sidelying: OBERs test: + LLE  Prone: Roller to L paraspinals, quadratus, piriformis 3 minutes  Quadruped: Arching 5 seconds, relaxing and transitioning into child pose 10x Child pose bringing arms to the R for L spinal opening 60 second holds, no pain just stretch Core activation with arm reach 5x each UE  Measure apparent leg length: L 94.5cm R 101 cm.   Placed wedge in R shoe, ambulated around gym with it, reporting no pain, feels more even with ambulation. Discussed POC with slow decrease of wedge height with goal of returning back to neutral position.   Patient given additional HEP and demonstrated understanding:  Access Code: [email or web address]  URL: [email or web address]  Date: 05/04/2018  Prepared by: Precious Bard   Exercises  Supine Sciatic Nerve Mobilization With Leg on Pillow - 20 reps - 2 sets - 5 hold - 1x daily - 7x weekly  Marjo Bicker Pose - 10 reps - 2 sets - 5 hold - 1x daily - 7x weekly  Quadruped Alternating Arm Lift - 10 reps - 2 sets - 5 hold - 1x daily - 7x weekly  Supine Transversus Abdominis Bracing - Hands on Stomach - 10 reps - 2 sets - 5 hold - 1x daily - 7x weekly                      PT Education - 05/04/18 1653    Education Details  addiitional HEP, wedge POC, core activation     Person(s) Educated  Patient    Methods  Explanation;Demonstration;Tactile cues;Verbal cues;Handout    Comprehension  Verbalized understanding;Returned demonstration;Verbal cues required;Tactile cues required;Need further instruction       PT Short Term Goals - 05/01/18 2041      PT SHORT TERM GOAL #1   Title   Pt will be independent with HEP in order to improve strength and decrease back pain in order to improve pain-free function at home and work.     Time  4    Period  Weeks    Status  New    Target Date  05/25/18        PT Long Term Goals - 05/01/18 2042      PT LONG TERM GOAL #1   Title  Pt will decrease mODI scoreby at least 13 points in order demonstrate clinically significant reduction in back pain/disability.    Baseline  04/27/18: 42%    Time  8    Period  Weeks    Status  New    Target Date  06/22/18      PT LONG TERM GOAL #2   Title  Pt will decrease worst back pain as reported on NPRS by at least 2 points in order to demonstrate clinically significant reduction in back pain.     Baseline  04/27/18: worst: 6/10    Time  8    Period  Weeks    Status  New    Target Date  06/22/18      PT LONG TERM GOAL #3   Title  Pt will be able to perform R hip extension in prone without increase in low back pain in order to resume exercises to increase hip extension strength without increase in low back pain    Baseline  04/27/18: R hip extension increases L low back pain    Time  8    Period  Weeks    Status  New    Target Date  06/22/18            Plan - 05/04/18 1656    Clinical Impression Statement  Patient presents to physical therapy with good motivation. Reports no change from mechanical traction last session. Patient is challenged with core activation demonstrating weak abdominal musculature. Tactile and verbal cueing with breathing assisted in correct muscle recruitment. Noted apparent leg length from posterior trunk musculature tightness resulting in LLE being shorter than Right. Patient demonstrates understanding of additional new HEP. Pt will benefit from skilled PT services to address deficits and return to pain-free function at home and work.    Rehab Potential  Fair    PT Frequency  2x / week    PT Duration  8 weeks    PT Treatment/Interventions  ADLs/Self Care Home  Management;Biofeedback;Aquatic Therapy;Canalith Repostioning;Cryotherapy;Electrical Stimulation;Iontophoresis /ml Dexamethasone;Moist Heat;Traction;Ultrasound;DME Instruction;Gait training;Stair training;Functional mobility training;Therapeutic activities;Therapeutic exercise;Balance training;Neuromuscular re-education;Patient/family education;Manual techniques;Passive range of motion;Dry needling;Energy conservation;Taping;Vestibular    PT Next Visit Plan  Review and progress HEP, traction (consider prone if supine inneffective), abdominal and low back strengthening    PT Home Exercise Plan  Planks and double knee to chest or prayer pose    Consulted and Agree with Plan of Care  Patient       Patient will benefit from skilled therapeutic intervention in order to improve the following deficits and impairments:  Pain, Postural dysfunction, Decreased strength, Difficulty walking  Visit Diagnosis: Chronic left-sided low back pain with left-sided sciatica     Problem List Patient Active Problem List   Diagnosis Date Noted  . Essential hypertension 03/30/2018  . CN (constipation) 08/15/2014  . Elevated blood sugar 08/15/2014  . GERD (gastroesophageal reflux disease) 08/15/2014  . External hemorrhoid 08/15/2014  . Hypercholesteremia 08/15/2014  . Hypotestosteronism 08/15/2014  . Cardiac murmur 08/15/2014  . Awareness of heartbeats 08/15/2014  . Avitaminosis D 08/15/2014   Precious Bard, PT, DPT   05/04/2018, 5:04 PM   [facility] MAIN [facility] SERVICES [address] [place], [place], [address] Phone: [phone]   Fax:  [phone]  Name: Eric Lane MRN: [id number] Date of Birth: 1980-08-13

## 2018-05-09 ENCOUNTER — Ambulatory Visit: Payer: Commercial Managed Care - PPO

## 2018-05-09 DIAGNOSIS — M5442 Lumbago with sciatica, left side: Secondary | ICD-10-CM | POA: Diagnosis not present

## 2018-05-09 DIAGNOSIS — G8929 Other chronic pain: Secondary | ICD-10-CM | POA: Diagnosis not present

## 2018-05-09 NOTE — Therapy (Signed)
[facility] MAIN [facility] SERVICES [address] [place], [place], [address] Phone: [phone]   Fax:  [phone]  Physical Therapy Treatment  Patient Details  Name: Eric Lane MRN: [id number] Date of Birth: 1980-03-18 Referring Provider (PT): Dr. Wynetta Emery   Encounter Date: 05/09/2018  PT End of Session - 05/10/18 0927    Visit Number  3    Number of Visits  17    Date for PT Re-Evaluation  06/22/18    PT Start Time  1645    PT Stop Time  1729    PT Time Calculation (min)  44 min    Activity Tolerance  Patient tolerated treatment well    Behavior During Therapy  [facility] for tasks assessed/performed       History reviewed. No pertinent past medical history.  Past Surgical History:  Procedure Laterality Date  . LASIK Bilateral 09/20/2014    There were no vitals filed for this visit.  Subjective Assessment - 05/10/18 0924    Subjective  Patient reports he did not sleep well last night. Baby was up every 1.5 hours. Hasn't tried biking again but wants to attempt it. Has been compliant with HEP.     Pertinent History  Pt referred for low back pain. He was having intermittent low back pain for multiple years which was managed with gentle massage and rest. He saw a PT in [place] and did some strengthening, TENS, and stretches. He saw her for 3-4 times (once/week to once every other week). Pt reports short duration relief for approximately 1 day and then symptoms returned. He saw another PT who thought that maybe it was an issue with his hip. This therapist performed dry needling and manual therapy to his lumbar spine and L hip but he did not experience any improvement. He saw Joycelyn Man PA-C who ordered radiographs and then referred him to [facility]. MRI showed L5 on S1 grade I anterolithesis with bilateral pars defect as well as bilateral L5 foraminal nerve root compression. They performed a epidural without any improvement. He saw a  neurosurgeon who recommended that he return to [facility] for a facet joint injection. Pt had that injection and he states that offered some relief for a couple days and then the symptoms returned. Pt reports that over the course of the last year his symptoms have worsened. He had a repeat MRI on his lumbar spine without any significant changes.  Pt reports that the pain is in the top of his L buttock. There are times when he feels like his hamstring is really tight and like it is going to "burst." Pt states that he gets the pain all the way down to his ankle as well. He gets pins and needles into his L leg but he denies any numbness/tingling. No symptoms on RLE. He denies any LLE weakness. Intermittently the pain wakes him up at night if he lays on his L side. Pt recently had a sleep study but has not received the results yet. Otherwise no recent changes to his health. Pt denies any saddle paresthesia or bowel/bladder incontinence. He has a history of low testosterone and per medical record has recently had difficulty with erectile function. Pt denies any relation to his low back pain.    Limitations  Standing;Walking    Diagnostic tests  Low back MRI x 2, see history    Patient Stated Goals  Decrease pain and improve core strength    Currently in  Pain?  Yes    Pain Score  5     Pain Location  Back    Pain Orientation  Left;Posterior;Lower;Medial    Pain Descriptors / Indicators  Aching    Pain Type  Chronic pain    Pain Onset  More than a month ago    Pain Frequency  Intermittent           Manual Hamstring stretch 60 seconds each LE, leg on PT shoulder with gradual increase with duration held Popliteal angle stretch 60 seconds each LE Figure four stretch LLE 60 seconds Prone: roller stick to L gluteal, piriformis, greater trochanter, quadratus x 5 minutes for muscle tissue relief.  Prone: hip flexor/iliopsoas stretch 60 seconds each LE Supine: short arc distraction with belt  with L hip in figure for for maximal relief 10x 10 second hold in inferior direction SAD with belt LLE inferior distraction 6x 10 second holds LAD with belt LLE inferior distraction with varying arc of motion 6x 15 second holds  LE rotation for low back pain relief x 45 seconds Prone positioning with pillow under hips for pain relief.   TherEx  Nerve glides with SLR on PT shoulder with dorsiflexion and plantarflexion gliding 20 each LE TrA activation 10x 3 second holds TrA activation 10x 3 second holds with  March, focus on breathing for core activation  Seated: swiss ball forward flexion rollout, chin tuck for positioning with verbal and tactile cueing for alignment 10x 3 second hold forward, 8x lateral angle plane;  relief of symptoms.   Seated: piriformis stretch 60 seconds  Decreased wedge height by one peel.                 PT Education - 05/10/18 0926    Education Details  core activation, swiss ball rollouts, posture     Person(s) Educated  Patient    Methods  Explanation;Demonstration;Tactile cues;Verbal cues    Comprehension  Verbalized understanding;Returned demonstration;Verbal cues required;Tactile cues required;Need further instruction       PT Short Term Goals - 05/01/18 2041      PT SHORT TERM GOAL #1   Title  Pt will be independent with HEP in order to improve strength and decrease back pain in order to improve pain-free function at home and work.     Time  4    Period  Weeks    Status  New    Target Date  05/25/18        PT Long Term Goals - 05/01/18 2042      PT LONG TERM GOAL #1   Title  Pt will decrease mODI scoreby at least 13 points in order demonstrate clinically significant reduction in back pain/disability.    Baseline  04/27/18: 42%    Time  8    Period  Weeks    Status  New    Target Date  06/22/18      PT LONG TERM GOAL #2   Title  Pt will decrease worst back pain as reported on NPRS by at least 2 points in order to demonstrate  clinically significant reduction in back pain.     Baseline  04/27/18: worst: 6/10    Time  8    Period  Weeks    Status  New    Target Date  06/22/18      PT LONG TERM GOAL #3   Title  Pt will be able to perform R hip extension in prone without increase in  low back pain in order to resume exercises to increase hip extension strength without increase in low back pain    Baseline  04/27/18: R hip extension increases L low back pain    Time  8    Period  Weeks    Status  New    Target Date  06/22/18            Plan - 05/10/18 0931    Clinical Impression Statement  Patient presents to physical therapy with compliance to Hep and good motivation. Has increased tension in left lateral lower back that was relieved with extensive manual and therex. Swiss ball added to HEP for muscle tissue lengthening. Core activation continues to be challenging with improved activation with breathing. Pt will benefit from skilled PT services to address deficits and return to pain-free function at home and work.    Rehab Potential  Fair    PT Frequency  2x / week    PT Duration  8 weeks    PT Treatment/Interventions  ADLs/Self Care Home Management;Biofeedback;Aquatic Therapy;Canalith Repostioning;Cryotherapy;Electrical Stimulation;Iontophoresis /ml Dexamethasone;Moist Heat;Traction;Ultrasound;DME Instruction;Gait training;Stair training;Functional mobility training;Therapeutic activities;Therapeutic exercise;Balance training;Neuromuscular re-education;Patient/family education;Manual techniques;Passive range of motion;Dry needling;Energy conservation;Taping;Vestibular    PT Next Visit Plan  Review and progress HEP, traction (consider prone if supine inneffective), abdominal and low back strengthening    PT Home Exercise Plan  Planks and double knee to chest or prayer pose    Consulted and Agree with Plan of Care  Patient       Patient will benefit from skilled therapeutic intervention in order to improve  the following deficits and impairments:  Pain, Postural dysfunction, Decreased strength, Difficulty walking  Visit Diagnosis: Chronic left-sided low back pain with left-sided sciatica     Problem List Patient Active Problem List   Diagnosis Date Noted  . Essential hypertension 03/30/2018  . CN (constipation) 08/15/2014  . Elevated blood sugar 08/15/2014  . GERD (gastroesophageal reflux disease) 08/15/2014  . External hemorrhoid 08/15/2014  . Hypercholesteremia 08/15/2014  . Hypotestosteronism 08/15/2014  . Cardiac murmur 08/15/2014  . Awareness of heartbeats 08/15/2014  . Avitaminosis D 08/15/2014   Precious Bard, PT, DPT   05/10/2018, 9:31 AM  [facility] MAIN [facility] SERVICES [address] [place], [place], [address] Phone: [phone]   Fax:  [phone]  Name: Eric Lane MRN: [id number] Date of Birth: [birth year]

## 2018-05-11 ENCOUNTER — Ambulatory Visit: Payer: Commercial Managed Care - PPO

## 2018-05-11 ENCOUNTER — Other Ambulatory Visit: Payer: Self-pay

## 2018-05-11 DIAGNOSIS — G8929 Other chronic pain: Secondary | ICD-10-CM

## 2018-05-11 DIAGNOSIS — M5442 Lumbago with sciatica, left side: Principal | ICD-10-CM

## 2018-05-11 NOTE — Therapy (Signed)
[facility] MAIN [facility] SERVICES [address] [place], [place], [address] Phone: [phone]   Fax:  [phone]  Physical Therapy Treatment  Patient Details  Name: Eric Lane MRN: [id number] Date of Birth: [birth year] Referring Provider (PT): Dr. Wynetta Emery   Encounter Date: 05/11/2018  PT End of Session - 05/12/18 1124    Visit Number  4    Number of Visits  17    Date for PT Re-Evaluation  06/22/18    PT Start Time  1645    PT Stop Time  1729    PT Time Calculation (min)  44 min    Activity Tolerance  Patient tolerated treatment well    Behavior During Therapy  [facility] for tasks assessed/performed       History reviewed. No pertinent past medical history.  Past Surgical History:  Procedure Laterality Date  . LASIK Bilateral 09/20/2014    There were no vitals filed for this visit.  Subjective Assessment - 05/11/18 1713    Subjective  Patient reports he was pain/symptom free tuesday morning until after lunch when stiffness and pain returned. Has been compliant with HEP.     Pertinent History  Pt referred for low back pain. He was having intermittent low back pain for multiple years which was managed with gentle massage and rest. He saw a PT in [place] and did some strengthening, TENS, and stretches. He saw her for 3-4 times (once/week to once every other week). Pt reports short duration relief for approximately 1 day and then symptoms returned. He saw another PT who thought that maybe it was an issue with his hip. This therapist performed dry needling and manual therapy to his lumbar spine and L hip but he did not experience any improvement. He saw Joycelyn Man PA-C who ordered radiographs and then referred him to [facility]. MRI showed L5 on S1 grade I anterolithesis with bilateral pars defect as well as bilateral L5 foraminal nerve root compression. They performed a epidural without any improvement. He saw a neurosurgeon who  recommended that he return to [facility] for a facet joint injection. Pt had that injection and he states that offered some relief for a couple days and then the symptoms returned. Pt reports that over the course of the last year his symptoms have worsened. He had a repeat MRI on his lumbar spine without any significant changes.  Pt reports that the pain is in the top of his L buttock. There are times when he feels like his hamstring is really tight and like it is going to "burst." Pt states that he gets the pain all the way down to his ankle as well. He gets pins and needles into his L leg but he denies any numbness/tingling. No symptoms on RLE. He denies any LLE weakness. Intermittently the pain wakes him up at night if he lays on his L side. Pt recently had a sleep study but has not received the results yet. Otherwise no recent changes to his health. Pt denies any saddle paresthesia or bowel/bladder incontinence. He has a history of low testosterone and per medical record has recently had difficulty with erectile function. Pt denies any relation to his low back pain.    Limitations  Standing;Walking    Diagnostic tests  Low back MRI x 2, see history    Patient Stated Goals  Decrease pain and improve core strength    Currently in Pain?  Yes    Pain Score  4     Pain Location  Back    Pain Orientation  Left;Posterior;Lower;Medial    Pain Descriptors / Indicators  Aching    Pain Type  Chronic pain    Pain Onset  More than a month ago    Pain Frequency  Intermittent         Manual Hamstring stretch 60 seconds each LE, leg on PT shoulder with gradual increase with duration held Popliteal angle stretch 60 seconds each LE Figure four stretch LLE 60 seconds Prone: roller stick to L gluteal, piriformis, greater trochanter, quadratus x 5 minutes for muscle tissue relief.  Prone: hip flexor/iliopsoas stretch 60 seconds each LE Supine: short arc distraction with belt with L hip in figure for  for maximal relief 10x 10 second hold in inferior direction SAD with belt LLE inferior distraction 6x 10 second holds LAD with belt LLE inferior distraction with varying arc of motion 6x 15 second holds  LE rotation for low back pain relief x 45 seconds Prone positioning with pillow under hips for pain relief.   Roller to L calf 2 minutes  TherEx Nerve glides with SLR on PT shoulder with dorsiflexion and plantarflexion gliding 20 each LE TrA activation 10x 3 second holds TrA activation 10x 3 second holds with  March, focus on breathing for core activation  Seated: swiss ball forward flexion rollout, chin tuck for positioning with verbal and tactile cueing for alignment 10x 3 second hold forward, 10x lateral angle plane;  relief of symptoms.   Seated: piriformis stretch 60 seconds   Quadruped:  Core activation with arm reach 5x each UE Decreased wedge height by one peel.                        PT Education - 05/11/18 1714    Education Details  core activation, posture, manual     Person(s) Educated  Patient    Methods  Explanation;Demonstration;Tactile cues;Verbal cues    Comprehension  Verbalized understanding;Returned demonstration;Verbal cues required;Tactile cues required;Need further instruction       PT Short Term Goals - 05/01/18 2041      PT SHORT TERM GOAL #1   Title  Pt will be independent with HEP in order to improve strength and decrease back pain in order to improve pain-free function at home and work.     Time  4    Period  Weeks    Status  New    Target Date  05/25/18        PT Long Term Goals - 05/01/18 2042      PT LONG TERM GOAL #1   Title  Pt will decrease mODI scoreby at least 13 points in order demonstrate clinically significant reduction in back pain/disability.    Baseline  04/27/18: 42%    Time  8    Period  Weeks    Status  New    Target Date  06/22/18      PT LONG TERM GOAL #2   Title  Pt will decrease worst back pain as  reported on NPRS by at least 2 points in order to demonstrate clinically significant reduction in back pain.     Baseline  04/27/18: worst: 6/10    Time  8    Period  Weeks    Status  New    Target Date  06/22/18      PT LONG TERM GOAL #3   Title  Pt will be  able to perform R hip extension in prone without increase in low back pain in order to resume exercises to increase hip extension strength without increase in low back pain    Baseline  04/27/18: R hip extension increases L low back pain    Time  8    Period  Weeks    Status  New    Target Date  06/22/18            Plan - 05/12/18 1125    Clinical Impression Statement  Patient presents with good motivation to session with report of relief of symptoms the day following the previous session. Patient challenged with prolonged core activation due to weakened musculature.Has increased tension in left lateral lower back that was relieved with extensive manual and therex. Pt will benefit from skilled PT services to address deficits and return to pain-free function at home and work.    Personal Factors and Comorbidities  Transportation    Rehab Potential  Fair    PT Frequency  2x / week    PT Duration  8 weeks    PT Treatment/Interventions  ADLs/Self Care Home Management;Biofeedback;Aquatic Therapy;Canalith Repostioning;Cryotherapy;Electrical Stimulation;Iontophoresis /ml Dexamethasone;Moist Heat;Traction;Ultrasound;DME Instruction;Gait training;Stair training;Functional mobility training;Therapeutic activities;Therapeutic exercise;Balance training;Neuromuscular re-education;Patient/family education;Manual techniques;Passive range of motion;Dry needling;Energy conservation;Taping;Vestibular    PT Next Visit Plan  Review and progress HEP, traction (consider prone if supine inneffective), abdominal and low back strengthening    PT Home Exercise Plan  Planks and double knee to chest or prayer pose    Consulted and Agree with Plan of Care   Patient       Patient will benefit from skilled therapeutic intervention in order to improve the following deficits and impairments:  Pain, Postural dysfunction, Decreased strength, Difficulty walking  Visit Diagnosis: Chronic left-sided low back pain with left-sided sciatica     Problem List Patient Active Problem List   Diagnosis Date Noted  . Essential hypertension 03/30/2018  . CN (constipation) 08/15/2014  . Elevated blood sugar 08/15/2014  . GERD (gastroesophageal reflux disease) 08/15/2014  . External hemorrhoid 08/15/2014  . Hypercholesteremia 08/15/2014  . Hypotestosteronism 08/15/2014  . Cardiac murmur 08/15/2014  . Awareness of heartbeats 08/15/2014  . Avitaminosis D 08/15/2014   Precious Bard, PT, DPT   05/12/2018, 11:27 AM  [facility] MAIN [facility] SERVICES [address] [place], [place], [address] Phone: [phone]   Fax:  [phone]  Name: Eric Lane MRN: [id number] Date of Birth: 08-11-1980

## 2018-05-23 NOTE — Therapy (Signed)
[facility] MAIN [facility] SERVICES [address] [place], [place], [address] Phone: [phone]   Fax:  [phone]  Patient Details  Name: MACAI MULROY MRN: [id number] Date of Birth: 1981-01-25 Referring Provider:  No ref. provider found  Encounter Date: 05/23/2018  Patient called by PT to ensure patient is doing well, does not have questions, and review HEP. Called due to current outpatient closure for COVID- 19. Patient did not pick up PT call. PT left voicemail stating reason for call and number for call back for further questions/concerns.    Precious Bard, PT, DPT   05/23/2018, 3:54 PM  [facility] MAIN [facility] SERVICES [address] [place], [place], [address] Phone: [phone]   Fax:  [phone]

## 2018-05-25 ENCOUNTER — Ambulatory Visit: Payer: Commercial Managed Care - PPO

## 2018-06-09 ENCOUNTER — Ambulatory Visit: Payer: Commercial Managed Care - PPO

## 2018-06-13 NOTE — Therapy (Signed)
[facility] MAIN [facility] SERVICES [address] [place], [place], [address] Phone: [phone]   Fax:  [phone]  Patient Details  Name: Eric Lane MRN: [id number] Date of Birth: [birth year] Referring Provider:  No ref. provider found  Encounter Date: 06/13/2018   The patient has been contacted today in regards to telehealth services. The patient expressed an interest in participating in telehealth visits depending on insurance coverage. Patient has been informed that an [facility] support representative will be reaching out to them to verify their insurance benefits and for scheduling    Lynnea Maizes PT, DPT, GCS  Keileigh Vahey 06/13/2018, 5:11 PM  [name] [address] [place], [place], [address] Phone: [phone]   Fax:  [phone]

## 2018-06-21 ENCOUNTER — Ambulatory Visit: Payer: Commercial Managed Care - PPO | Attending: Neurosurgery

## 2018-06-21 ENCOUNTER — Other Ambulatory Visit: Payer: Self-pay

## 2018-06-21 DIAGNOSIS — M5442 Lumbago with sciatica, left side: Secondary | ICD-10-CM | POA: Diagnosis not present

## 2018-06-21 DIAGNOSIS — G8929 Other chronic pain: Secondary | ICD-10-CM | POA: Diagnosis not present

## 2018-06-21 NOTE — Therapy (Signed)
[name] MAIN [facility] SERVICES [address] [place], [place], [address] Phone: [phone]   Fax:  [phone]  Physical Therapy Treatment  Patient Details  Name: Eric Lane MRN: [id number] Date of Birth: [birth year] Referring Provider (PT): Dr. Wynetta Emery   Encounter Date: 06/21/2018  PT End of Session - 06/21/18 1628    Visit Number  5    Number of Visits  17    Date for PT Re-Evaluation  06/22/18    PT Start Time  1530    PT Stop Time  1620    PT Time Calculation (min)  50 min    Activity Tolerance  Patient tolerated treatment well    Behavior During Therapy  [facility] for tasks assessed/performed       History reviewed. No pertinent past medical history.  Past Surgical History:  Procedure Laterality Date  . LASIK Bilateral 09/20/2014    There were no vitals filed for this visit.  Subjective Assessment - 06/21/18 1626    Subjective  Pt reports that he is doing alright at this time. He continues to have intermittent symptoms. He rode his bike on Saturday and his back felt good during his ride and following however he was sore the next day on Saturday. No pain reported at this time.     Pertinent History  Pt referred for low back pain. He was having intermittent low back pain for multiple years which was managed with gentle massage and rest. He saw a PT in [place] and did some strengthening, TENS, and stretches. He saw her for 3-4 times (once/week to once every other week). Pt reports short duration relief for approximately 1 day and then symptoms returned. He saw another PT who thought that maybe it was an issue with his hip. This therapist performed dry needling and manual therapy to his lumbar spine and L hip but he did not experience any improvement. He saw Joycelyn Man PA-C who ordered radiographs and then referred him to [facility]. MRI showed L5 on S1 grade I anterolithesis with bilateral pars defect as well as bilateral L5  foraminal nerve root compression. They performed a epidural without any improvement. He saw a neurosurgeon who recommended that he return to [facility] for a facet joint injection. Pt had that injection and he states that offered some relief for a couple days and then the symptoms returned. Pt reports that over the course of the last year his symptoms have worsened. He had a repeat MRI on his lumbar spine without any significant changes.  Pt reports that the pain is in the top of his L buttock. There are times when he feels like his hamstring is really tight and like it is going to "burst." Pt states that he gets the pain all the way down to his ankle as well. He gets pins and needles into his L leg but he denies any numbness/tingling. No symptoms on RLE. He denies any LLE weakness. Intermittently the pain wakes him up at night if he lays on his L side. Pt recently had a sleep study but has not received the results yet. Otherwise no recent changes to his health. Pt denies any saddle paresthesia or bowel/bladder incontinence. He has a history of low testosterone and per medical record has recently had difficulty with erectile function. Pt denies any relation to his low back pain.    Limitations  Standing;Walking    Diagnostic tests  Low back MRI x 2, see history  Patient Stated Goals  Decrease pain and improve core strength    Currently in Pain?  No/denies    Pain Onset  --         TREATMENT   Physical Therapy Telehealth Visit:  I connected with Kordell Jafri today at 1530 by Webex video conference and verified that I am speaking with the correct person using two identifiers.  I discussed the limitations, risks, security and privacy concerns of performing an evaluation and management service by Webex and the availability of in person appointments. I also discussed with the patient that there may be a patient responsible charge related to this service. The patient expressed understanding and  agreed to proceed. Identified to the patient that therapist is a licensed physical therapist in the state of [place].  Other persons participating in the visit and their role in the encounter: None Patient's location: Home Patient's address: [address] [place] [address] (confirmed in case of emergency) Patient's phone #: [phone] (confirmed in case of technical difficulties) Provider's location: [facility] main OP clinic [place] Patient agreed to evaluation/treatment by telemedicine   TherEx Hooklying lumbar rocking x 1 minute Hip fall outs bilateral x 1 minute with cues to maintain quiet pelvis/spine; Attempted bilateral and single leg bridges but discontinued due to increase in R posterior hip pain; Ant/post pelvic tilt 3s hold each x 1 minute; Dead bug with foot touch x 1 minute; Dead bug without foot touch x 1 minute; L knee SLR sciatic nerve flossing x 1 minute Front plank 30s hold x 2; Side plank 30s hold x 2 bilateral; Quadruped rocking intermittently during rest times Quadruped rocking with lateral bias intermittently during rest times; Bird dog with arms first, then legs, then coordinated arms/legs x 1 minute; Hooklying self lumbar traction 10s hold/10s off x 1 minute; Hooklying pball press down 3-5s hold x 1 minute; Hooklying pball hamstring bridge 3-5s hold x 1 minute, no pain reported    Pt educated throughout session about proper posture and technique with exercises. Improved exercise technique, movement at target joints, use of target muscles after min to mod verbal and visual cues   Patient presents with good motivation to session. He demonstrates weakness as well as poor endurance with some abdominal strengthening exercises. Only painful activity is bridges today so discontinued. No pain hen performing hamstring bridges on physioball.. Patient appropriately challenged with prolonged core activation due to weakened musculature. Will update goals and perform  recertification at next visit. Pt will benefit from skilled PT services to address deficits and return to pain-free function at home and work.             PT Short Term Goals - 05/01/18 2041      PT SHORT TERM GOAL #1   Title  Pt will be independent with HEP in order to improve strength and decrease back pain in order to improve pain-free function at home and work.     Time  4    Period  Weeks    Status  New    Target Date  05/25/18        PT Long Term Goals - 05/01/18 2042      PT LONG TERM GOAL #1   Title  Pt will decrease mODI scoreby at least 13 points in order demonstrate clinically significant reduction in back pain/disability.    Baseline  04/27/18: 42%    Time  8    Period  Weeks    Status  New  Target Date  06/22/18      PT LONG TERM GOAL #2   Title  Pt will decrease worst back pain as reported on NPRS by at least 2 points in order to demonstrate clinically significant reduction in back pain.     Baseline  04/27/18: worst: 6/10    Time  8    Period  Weeks    Status  New    Target Date  06/22/18      PT LONG TERM GOAL #3   Title  Pt will be able to perform R hip extension in prone without increase in low back pain in order to resume exercises to increase hip extension strength without increase in low back pain    Baseline  04/27/18: R hip extension increases L low back pain    Time  8    Period  Weeks    Status  New    Target Date  06/22/18            Plan - 06/21/18 1628    Clinical Impression Statement  Patient presents with good motivation to session. He demonstrates weakness as well as poor endurance with some abdominal strengthening exercises. Only painful activity is bridges today so discontinued. No pain hen performing hamstring bridges on physioball.. Patient appropriately challenged with prolonged core activation due to weakened musculature. Will update goals and perform recertification at next visit. Pt will benefit from skilled PT services  to address deficits and return to pain-free function at home and work.    Personal Factors and Comorbidities  Transportation    Rehab Potential  Fair    PT Frequency  2x / week    PT Duration  8 weeks    PT Treatment/Interventions  ADLs/Self Care Home Management;Biofeedback;Aquatic Therapy;Canalith Repostioning;Cryotherapy;Electrical Stimulation;Iontophoresis 4mg /ml Dexamethasone;Moist Heat;Traction;Ultrasound;DME Instruction;Gait training;Stair training;Functional mobility training;Therapeutic activities;Therapeutic exercise;Balance training;Neuromuscular re-education;Patient/family education;Manual techniques;Passive range of motion;Dry needling;Energy conservation;Taping;Vestibular    PT Next Visit Plan  Goal update and recertification, Review and progress HEP, traction (consider prone if supine inneffective), abdominal and low back strengthening    PT Home Exercise Plan  Planks and double knee to chest or prayer pose    Consulted and Agree with Plan of Care  Patient       Patient will benefit from skilled therapeutic intervention in order to improve the following deficits and impairments:  Pain, Postural dysfunction, Decreased strength, Difficulty walking  Visit Diagnosis: Chronic left-sided low back pain with left-sided sciatica     Problem List Patient Active Problem List   Diagnosis Date Noted  . Essential hypertension 03/30/2018  . CN (constipation) 08/15/2014  . Elevated blood sugar 08/15/2014  . GERD (gastroesophageal reflux disease) 08/15/2014  . External hemorrhoid 08/15/2014  . Hypercholesteremia 08/15/2014  . Hypotestosteronism 08/15/2014  . Cardiac murmur 08/15/2014  . Awareness of heartbeats 08/15/2014  . Avitaminosis D 08/15/2014   Lynnea Maizes D  PT, DPT, GCS  , 06/22/2018, 11:42 AM  [place] [facility] [address] [place], [place], [address] Phone: [phone]   Fax:  [phone]  Name: Marthenia RollingJoseph  K Dais MRN: [id number] Date of Birth: 1980-09-20

## 2018-06-23 DIAGNOSIS — M4317 Spondylolisthesis, lumbosacral region: Secondary | ICD-10-CM | POA: Diagnosis not present

## 2018-07-12 DIAGNOSIS — M4317 Spondylolisthesis, lumbosacral region: Secondary | ICD-10-CM | POA: Diagnosis not present

## 2018-08-09 ENCOUNTER — Encounter: Payer: Self-pay | Admitting: Physician Assistant

## 2018-08-30 ENCOUNTER — Other Ambulatory Visit: Payer: Self-pay | Admitting: Neurosurgery

## 2018-11-09 NOTE — Pre-Procedure Instructions (Signed)
[id number] [place]ina Rabon, [place] [address] [place] [address] Phone: [phone] Fax: [phone]  [id number] - [place], [place] [address] Phone: [phone] Fax: [phone]  [place], [place] [address] Phone: [phone] Fax: [phone]      Your procedure is scheduled on  11-14-18 from 0730-1027 am  Report to [facility] "A" at 0530A.M., and check in at the Admitting office.  Call this number if you have problems the morning of surgery:  [phone]  Call [phone] if you have any questions prior to your surgery date Monday-Friday 8am-4pm    Remember:  Do not eat or drink after midnight the night before your surgery  Take these medicines the morning of surgery with A SIP OF WATER :Tylenol as needed  7 days prior to surgery STOP taking any Aspirin (unless otherwise instructed by your surgeon), Aleve, Naproxen, Ibuprofen, Motrin, Advil, Goody's, BC's, all herbal medications, fish oil, and all vitamins.    The Morning of Surgery  Do not wear jewelry.  Do not wear lotions, powders, or perfumes/colognes, or deodorant    Men may shave face and neck.  Do not bring valuables to the hospital.  [facility] is not responsible for any belongings or valuables.  If you are a smoker, DO NOT Smoke 24 hours prior to surgery IF you wear a CPAP at night please bring your mask, tubing, and machine the morning of surgery   Remember that you must have someone to transport you home after your surgery, and remain with you for 24 hours if you are discharged the same day.   Contacts, glasses, hearing aids, dentures or bridgework may not be worn into surgery.    Leave your suitcase in the car.  After surgery it may be brought to your room.  For patients admitted to the hospital, discharge time will  be determined by your treatment team.  Patients discharged the day of surgery will not be allowed to drive home.    Special instructions:   [facility]- Preparing For Surgery  Before surgery, you can play an important role. Because skin is not sterile, your skin needs to be as free of germs as possible. You can reduce the number of germs on your skin by washing with CHG (chlorahexidine gluconate) Soap before surgery.  CHG is an antiseptic cleaner which kills germs and bonds with the skin to continue killing germs even after washing.    Oral Hygiene is also important to reduce your risk of infection.  Remember - BRUSH YOUR TEETH THE MORNING OF SURGERY WITH YOUR REGULAR TOOTHPASTE  Please do not use if you have an allergy to CHG or antibacterial soaps. If your skin becomes reddened/irritated stop using the CHG.  Do not shave (including legs and underarms) for at least 48 hours prior to first CHG shower. It is OK to shave your face.  Please follow these instructions carefully.   1. Shower the NIGHT BEFORE SURGERY and the MORNING OF SURGERY with CHG Soap.   2. If you chose to wash your hair, wash your hair first as usual with your normal shampoo.  3. After you shampoo, rinse your hair and body thoroughly to remove the shampoo.  4. Use CHG as you would any other liquid soap. You can apply CHG directly to the skin and wash  gently with a scrungie or a clean washcloth.   5. Apply the CHG Soap to your body ONLY FROM THE NECK DOWN.  Do not use on open wounds or open sores. Avoid contact with your eyes, ears, mouth and genitals (private parts). Wash Face and genitals (private parts)  with your normal soap.   6. Wash thoroughly, paying special attention to the area where your surgery will be performed.  7. Thoroughly rinse your body with warm water from the neck down.  8. DO NOT shower/wash with your normal soap after using and rinsing off the CHG Soap.  9. Pat yourself dry with a CLEAN  TOWEL.  10. Wear CLEAN PAJAMAS to bed the night before surgery, wear comfortable clothes the morning of surgery  11. Place CLEAN SHEETS on your bed the night of your first shower and DO NOT SLEEP WITH PETS.    Day of Surgery:  Do not apply any deodorants/lotions. Please shower the morning of surgery with the CHG soap  Please wear clean clothes to the hospital/surgery center.   Remember to brush your teeth WITH YOUR REGULAR TOOTHPASTE.   Please read over the  fact sheets that you were given.

## 2018-11-10 ENCOUNTER — Encounter (HOSPITAL_COMMUNITY): Payer: Self-pay

## 2018-11-10 ENCOUNTER — Other Ambulatory Visit: Payer: Self-pay

## 2018-11-10 ENCOUNTER — Other Ambulatory Visit (HOSPITAL_COMMUNITY)
Admission: RE | Admit: 2018-11-10 | Discharge: 2018-11-10 | Disposition: A | Discharge status: Home / Self Care | Payer: Commercial Managed Care - PPO | Source: Ambulatory Visit | Attending: Neurosurgery | Admitting: Neurosurgery

## 2018-11-10 ENCOUNTER — Encounter (HOSPITAL_COMMUNITY)
Admission: RE | Admit: 2018-11-10 | Discharge: 2018-11-10 | Disposition: A | Discharge status: Home / Self Care | Payer: Commercial Managed Care - PPO | Source: Ambulatory Visit | Attending: Neurosurgery | Admitting: Neurosurgery

## 2018-11-10 DIAGNOSIS — Z01812 Encounter for preprocedural laboratory examination: Secondary | ICD-10-CM | POA: Insufficient documentation

## 2018-11-10 DIAGNOSIS — Z20828 Contact with and (suspected) exposure to other viral communicable diseases: Secondary | ICD-10-CM | POA: Insufficient documentation

## 2018-11-10 LAB — SURGICAL PCR SCREEN
MRSA, PCR: NEGATIVE
Staphylococcus aureus: POSITIVE — AB

## 2018-11-10 LAB — TYPE AND SCREEN
ABO/RH(D): O POS
Antibody Screen: NEGATIVE

## 2018-11-10 LAB — CBC
HCT: 48 % (ref 39.0–52.0)
Hemoglobin: 15.8 g/dL (ref 13.0–17.0)
MCH: 28.6 pg (ref 26.0–34.0)
MCHC: 32.9 g/dL (ref 30.0–36.0)
MCV: 86.8 fL (ref 80.0–100.0)
Platelets: 232 10*3/uL (ref 150–400)
RBC: 5.53 MIL/uL (ref 4.22–5.81)
RDW: 12.6 % (ref 11.5–15.5)
WBC: 7.1 10*3/uL (ref 4.0–10.5)
nRBC: 0 % (ref 0.0–0.2)

## 2018-11-10 LAB — ABO/RH: ABO/RH(D): O POS

## 2018-11-10 NOTE — Progress Notes (Signed)
.  PCP - Fenton Malling Cardiologist - Dan Bensimhon  Chest x-[place] - N/A EKG - 12/01/2017 Stress Test - 12/22/2017 ECHO - years ago per patient report; records requested from [facility] Cardiac Cath - denies  Sleep Study - 04/22/2018 CPAP - denies  Blood Thinner Instructions: N/A Aspirin Instructions: N/A  Anesthesia review: yes; heart murmur per patient report  Patient denies shortness of breath, fever, cough and chest pain at PAT appointment  COVID testing today after PAT; patient states verbal understanding of self-quarantine guidelines.   Coronavirus Screening  Have you experienced the following symptoms:  Cough yes/no: No Fever (>100.64F)  yes/no: No Runny nose yes/no: No Sore throat yes/no: No Difficulty breathing/shortness of breath  yes/no: No  Have you or a family member traveled in the last 14 days and where? yes/no: No   If the patient indicates "YES" to the above questions, their PAT will be rescheduled to limit the exposure to others and, the surgeon will be notified. THE PATIENT WILL NEED TO BE ASYMPTOMATIC FOR 14 DAYS.   If the patient is not experiencing any of these symptoms, the PAT nurse will instruct them to NOT bring anyone with them to their appointment since they may have these symptoms or traveled as well.   Please remind your patients and families that hospital visitation restrictions are in effect and the importance of the restrictions.   Patient verbalized understanding of instructions that were given to them at the PAT appointment. Patient was also instructed that they will need to review over the PAT instructions again at home before surgery.

## 2018-11-11 LAB — NOVEL CORONAVIRUS, NAA (HOSP ORDER, SEND-OUT TO REF LAB; TAT 18-24 HRS): SARS-CoV-2, NAA: NOT DETECTED

## 2018-11-11 NOTE — Anesthesia Preprocedure Evaluation (Addendum)
Anesthesia Evaluation  Patient identified by MRN, date of birth, ID band Patient awake    Reviewed: Allergy & Precautions, H&P , NPO status , Patient's Chart, lab work & pertinent test results  Airway Mallampati: II   Neck ROM: full    Dental   Pulmonary former smoker,    breath sounds clear to auscultation       Cardiovascular hypertension,  Rhythm:regular Rate:Normal     Neuro/Psych    GI/Hepatic GERD  ,  Endo/Other  obese  Renal/GU      Musculoskeletal   Abdominal   Peds  Hematology   Anesthesia Other Findings   Reproductive/Obstetrics                            Anesthesia Physical Anesthesia Plan  ASA: II  Anesthesia Plan: General   Post-op Pain Management:    Induction: Intravenous  PONV Risk Score and Plan: 2 and Ondansetron, Dexamethasone, Midazolam and Treatment may vary due to age or medical condition  Airway Management Planned: Oral ETT  Additional Equipment:   Intra-op Plan:   Post-operative Plan: Extubation in OR  Informed Consent: I have reviewed the patients History and Physical, chart, labs and discussed the procedure including the risks, benefits and alternatives for the proposed anesthesia with the patient or authorized representative who has indicated his/her understanding and acceptance.       Plan Discussed with: CRNA, Anesthesiologist and Surgeon  Anesthesia Plan Comments: (Pt had cardiac eval by Dr. Haroldine Laws 11/2017 due to strong family history of CAD. Calcium score 12/22/17 0. ETT 12/22/17: Normal, no evidence of ischemia. Recommended risk factor modification. He also had a sleep study that was negative for OSA.   ETT 12/22/17:  Blood pressure demonstrated a normal response to exercise.  There was no ST segment deviation noted during stress.  The patient walked for 12:00 of a standard Bruce protocol. Peak HR of 181 which is 97% predicted maximal  HR  There were no ST changes to suggest ischemia.  BP response was normal  This is interpreted as a normal GXT . No evidence of ischemia.  Coronary CT 12/22/17: IMPRESSION: Coronary calcium score of 0. This was 0 percentile for age and sex matched control.)       Anesthesia Quick Evaluation

## 2018-11-14 ENCOUNTER — Inpatient Hospital Stay (HOSPITAL_COMMUNITY)
Admission: RE | Admit: 2018-11-14 | Discharge: 2018-11-15 | DRG: 455 | Disposition: A | Discharge status: Home / Self Care | Payer: Commercial Managed Care - PPO | Attending: Neurosurgery | Admitting: Neurosurgery

## 2018-11-14 ENCOUNTER — Encounter (HOSPITAL_COMMUNITY)
Admission: RE | Disposition: A | Discharge status: Home / Self Care | Payer: Self-pay | Source: Home / Self Care | Attending: Neurosurgery

## 2018-11-14 ENCOUNTER — Inpatient Hospital Stay (HOSPITAL_COMMUNITY): Payer: Commercial Managed Care - PPO | Admitting: Physician Assistant

## 2018-11-14 ENCOUNTER — Inpatient Hospital Stay (HOSPITAL_COMMUNITY): Payer: Commercial Managed Care - PPO | Admitting: Certified Registered Nurse Anesthetist

## 2018-11-14 ENCOUNTER — Other Ambulatory Visit: Payer: Self-pay

## 2018-11-14 ENCOUNTER — Inpatient Hospital Stay (HOSPITAL_COMMUNITY): Payer: Commercial Managed Care - PPO

## 2018-11-14 ENCOUNTER — Encounter (HOSPITAL_COMMUNITY): Payer: Self-pay | Admitting: *Deleted

## 2018-11-14 DIAGNOSIS — Z8349 Family history of other endocrine, nutritional and metabolic diseases: Secondary | ICD-10-CM | POA: Diagnosis not present

## 2018-11-14 DIAGNOSIS — M5416 Radiculopathy, lumbar region: Secondary | ICD-10-CM | POA: Diagnosis present

## 2018-11-14 DIAGNOSIS — M48061 Spinal stenosis, lumbar region without neurogenic claudication: Secondary | ICD-10-CM | POA: Diagnosis present

## 2018-11-14 DIAGNOSIS — Z833 Family history of diabetes mellitus: Secondary | ICD-10-CM | POA: Diagnosis not present

## 2018-11-14 DIAGNOSIS — Z87891 Personal history of nicotine dependence: Secondary | ICD-10-CM

## 2018-11-14 DIAGNOSIS — Z825 Family history of asthma and other chronic lower respiratory diseases: Secondary | ICD-10-CM | POA: Diagnosis not present

## 2018-11-14 DIAGNOSIS — Z6831 Body mass index (BMI) 31.0-31.9, adult: Secondary | ICD-10-CM

## 2018-11-14 DIAGNOSIS — E669 Obesity, unspecified: Secondary | ICD-10-CM | POA: Diagnosis present

## 2018-11-14 DIAGNOSIS — Z820 Family history of epilepsy and other diseases of the nervous system: Secondary | ICD-10-CM | POA: Diagnosis not present

## 2018-11-14 DIAGNOSIS — Z8249 Family history of ischemic heart disease and other diseases of the circulatory system: Secondary | ICD-10-CM

## 2018-11-14 DIAGNOSIS — Z20828 Contact with and (suspected) exposure to other viral communicable diseases: Secondary | ICD-10-CM | POA: Diagnosis present

## 2018-11-14 DIAGNOSIS — M4317 Spondylolisthesis, lumbosacral region: Principal | ICD-10-CM | POA: Diagnosis present

## 2018-11-14 DIAGNOSIS — Z419 Encounter for procedure for purposes other than remedying health state, unspecified: Secondary | ICD-10-CM

## 2018-11-14 SURGERY — POSTERIOR LUMBAR FUSION 1 LEVEL
Anesthesia: General | Site: Spine Lumbar

## 2018-11-14 MED ORDER — ACETAMINOPHEN 500 MG PO TABS: 1000.0000 mg | ORAL_TABLET | Freq: Four times a day (QID) | ORAL | Status: DC | PRN

## 2018-11-14 MED ORDER — LIDOCAINE 2% (20 MG/ML) 5 ML SYRINGE
INTRAMUSCULAR | Status: AC
  Filled 2018-11-14: qty 5

## 2018-11-14 MED ORDER — PANTOPRAZOLE SODIUM 40 MG PO TBEC
40.0000 mg | DELAYED_RELEASE_TABLET | Freq: Every day | ORAL | Status: DC
  Administered 2018-11-14: 40 mg via ORAL
  Filled 2018-11-14: qty 1

## 2018-11-14 MED ORDER — THROMBIN 20000 UNITS EX SOLR
CUTANEOUS | Status: DC | PRN
  Administered 2018-11-14: 20 mL via TOPICAL

## 2018-11-14 MED ORDER — ALBUMIN HUMAN 5 % IV SOLN
INTRAVENOUS | Status: DC | PRN
  Administered 2018-11-14: 09:00:00 via INTRAVENOUS

## 2018-11-14 MED ORDER — FENTANYL CITRATE (PF) 100 MCG/2ML IJ SOLN
INTRAMUSCULAR | Status: DC | PRN
  Administered 2018-11-14: 50 ug via INTRAVENOUS
  Administered 2018-11-14: 100 ug via INTRAVENOUS
  Administered 2018-11-14 (×2): 50 ug via INTRAVENOUS

## 2018-11-14 MED ORDER — ROCURONIUM BROMIDE 50 MG/5ML IV SOSY
PREFILLED_SYRINGE | INTRAVENOUS | Status: DC | PRN
  Administered 2018-11-14: 30 mg via INTRAVENOUS
  Administered 2018-11-14: 50 mg via INTRAVENOUS
  Administered 2018-11-14: 10 mg via INTRAVENOUS
  Administered 2018-11-14: 20 mg via INTRAVENOUS

## 2018-11-14 MED ORDER — ONDANSETRON HCL 4 MG/2ML IJ SOLN
INTRAMUSCULAR | Status: DC | PRN
  Administered 2018-11-14: 4 mg via INTRAVENOUS

## 2018-11-14 MED ORDER — CEFAZOLIN SODIUM-DEXTROSE 2-4 GM/100ML-% IV SOLN
2.0000 g | INTRAVENOUS | Status: AC
  Administered 2018-11-14: 2 g via INTRAVENOUS
  Filled 2018-11-14: qty 100

## 2018-11-14 MED ORDER — FENTANYL CITRATE (PF) 100 MCG/2ML IJ SOLN
INTRAMUSCULAR | Status: AC
  Administered 2018-11-14: 50 ug via INTRAVENOUS
  Filled 2018-11-14: qty 2

## 2018-11-14 MED ORDER — FENTANYL CITRATE (PF) 250 MCG/5ML IJ SOLN
INTRAMUSCULAR | Status: AC
  Filled 2018-11-14: qty 5

## 2018-11-14 MED ORDER — CYCLOBENZAPRINE HCL 10 MG PO TABS
10.0000 mg | ORAL_TABLET | Freq: Three times a day (TID) | ORAL | Status: DC | PRN
  Administered 2018-11-14: 12:00:00 10 mg via ORAL
  Filled 2018-11-14 (×2): qty 1

## 2018-11-14 MED ORDER — CEFAZOLIN SODIUM-DEXTROSE 2-4 GM/100ML-% IV SOLN
2.0000 g | Freq: Three times a day (TID) | INTRAVENOUS | Status: AC
  Administered 2018-11-14 (×2): 2 g via INTRAVENOUS
  Filled 2018-11-14 (×3): qty 100

## 2018-11-14 MED ORDER — SODIUM CHLORIDE 0.9% FLUSH
3.0000 mL | Freq: Two times a day (BID) | INTRAVENOUS | Status: DC
  Administered 2018-11-14: 3 mL via INTRAVENOUS

## 2018-11-14 MED ORDER — SODIUM CHLORIDE 0.9 % IV SOLN
250.0000 mL | INTRAVENOUS | Status: DC
  Administered 2018-11-14: 250 mL via INTRAVENOUS

## 2018-11-14 MED ORDER — OXYCODONE HCL 5 MG PO TABS
5.0000 mg | ORAL_TABLET | Freq: Once | ORAL | Status: AC | PRN
  Administered 2018-11-14: 12:00:00 5 mg via ORAL

## 2018-11-14 MED ORDER — PANTOPRAZOLE SODIUM 40 MG IV SOLR: 40.0000 mg | Freq: Every day | INTRAVENOUS | Status: DC

## 2018-11-14 MED ORDER — SUGAMMADEX SODIUM 200 MG/2ML IV SOLN
INTRAVENOUS | Status: DC | PRN
  Administered 2018-11-14: 200 mg via INTRAVENOUS

## 2018-11-14 MED ORDER — ACETAMINOPHEN 10 MG/ML IV SOLN
INTRAVENOUS | Status: AC
  Filled 2018-11-14: qty 100

## 2018-11-14 MED ORDER — PHENYLEPHRINE 40 MCG/ML (10ML) SYRINGE FOR IV PUSH (FOR BLOOD PRESSURE SUPPORT)
PREFILLED_SYRINGE | INTRAVENOUS | Status: AC
  Filled 2018-11-14: qty 10

## 2018-11-14 MED ORDER — MIDAZOLAM HCL 2 MG/2ML IJ SOLN
INTRAMUSCULAR | Status: AC
  Filled 2018-11-14: qty 2

## 2018-11-14 MED ORDER — ACETAMINOPHEN 10 MG/ML IV SOLN
INTRAVENOUS | Status: DC | PRN
  Administered 2018-11-14: 1000 mg via INTRAVENOUS

## 2018-11-14 MED ORDER — ALUM & MAG HYDROXIDE-SIMETH 200-200-20 MG/5ML PO SUSP: 30.0000 mL | Freq: Four times a day (QID) | ORAL | Status: DC | PRN

## 2018-11-14 MED ORDER — OXYCODONE HCL 5 MG PO TABS
10.0000 mg | ORAL_TABLET | ORAL | Status: DC | PRN
  Administered 2018-11-14 – 2018-11-15 (×4): 10 mg via ORAL
  Filled 2018-11-14 (×4): qty 2

## 2018-11-14 MED ORDER — MENTHOL 3 MG MT LOZG: 1.0000 | LOZENGE | OROMUCOSAL | Status: DC | PRN

## 2018-11-14 MED ORDER — 0.9 % SODIUM CHLORIDE (POUR BTL) OPTIME
TOPICAL | Status: DC | PRN
  Administered 2018-11-14: 1000 mL

## 2018-11-14 MED ORDER — THROMBIN 20000 UNITS EX SOLR
CUTANEOUS | Status: AC
  Filled 2018-11-14: qty 20000

## 2018-11-14 MED ORDER — SODIUM CHLORIDE 0.9 % IV SOLN
INTRAVENOUS | Status: DC | PRN
  Administered 2018-11-14: 500 mL

## 2018-11-14 MED ORDER — ONDANSETRON HCL 4 MG PO TABS: 4.0000 mg | ORAL_TABLET | Freq: Four times a day (QID) | ORAL | Status: DC | PRN

## 2018-11-14 MED ORDER — BUPIVACAINE LIPOSOME 1.3 % IJ SUSP
INTRAMUSCULAR | Status: DC | PRN
  Administered 2018-11-14: 20 mL

## 2018-11-14 MED ORDER — OXYCODONE HCL 5 MG PO TABS
ORAL_TABLET | ORAL | Status: AC
  Administered 2018-11-14: 5 mg via ORAL
  Filled 2018-11-14: qty 1

## 2018-11-14 MED ORDER — ROCURONIUM BROMIDE 10 MG/ML (PF) SYRINGE
PREFILLED_SYRINGE | INTRAVENOUS | Status: AC
  Filled 2018-11-14: qty 10

## 2018-11-14 MED ORDER — OXYCODONE HCL 5 MG/5ML PO SOLN: 5.0000 mg | Freq: Once | ORAL | Status: AC | PRN

## 2018-11-14 MED ORDER — LIDOCAINE-EPINEPHRINE 1 %-1:100000 IJ SOLN
INTRAMUSCULAR | Status: AC
  Filled 2018-11-14: qty 1

## 2018-11-14 MED ORDER — MIDAZOLAM HCL 5 MG/5ML IJ SOLN
INTRAMUSCULAR | Status: DC | PRN
  Administered 2018-11-14: 2 mg via INTRAVENOUS

## 2018-11-14 MED ORDER — LIDOCAINE 2% (20 MG/ML) 5 ML SYRINGE
INTRAMUSCULAR | Status: DC | PRN
  Administered 2018-11-14: 60 mg via INTRAVENOUS

## 2018-11-14 MED ORDER — CHLORHEXIDINE GLUCONATE CLOTH 2 % EX PADS: 6.0000 | MEDICATED_PAD | Freq: Once | CUTANEOUS | Status: DC

## 2018-11-14 MED ORDER — FENTANYL CITRATE (PF) 100 MCG/2ML IJ SOLN
25.0000 ug | INTRAMUSCULAR | Status: DC | PRN
  Administered 2018-11-14: 11:00:00 50 ug via INTRAVENOUS

## 2018-11-14 MED ORDER — HYDROMORPHONE HCL 1 MG/ML IJ SOLN: 1.0000 mg | INTRAMUSCULAR | Status: DC | PRN

## 2018-11-14 MED ORDER — LACTATED RINGERS IV SOLN
INTRAVENOUS | Status: DC | PRN
  Administered 2018-11-14 (×2): via INTRAVENOUS

## 2018-11-14 MED ORDER — ACETAMINOPHEN 650 MG RE SUPP: 650.0000 mg | RECTAL | Status: DC | PRN

## 2018-11-14 MED ORDER — METHOCARBAMOL 500 MG PO TABS
500.0000 mg | ORAL_TABLET | Freq: Four times a day (QID) | ORAL | Status: DC | PRN
  Administered 2018-11-14 – 2018-11-15 (×3): 500 mg via ORAL
  Filled 2018-11-14 (×3): qty 1

## 2018-11-14 MED ORDER — PROPOFOL 10 MG/ML IV BOLUS
INTRAVENOUS | Status: DC | PRN
  Administered 2018-11-14: 180 mg via INTRAVENOUS

## 2018-11-14 MED ORDER — ONDANSETRON HCL 4 MG/2ML IJ SOLN: 4.0000 mg | Freq: Four times a day (QID) | INTRAMUSCULAR | Status: DC | PRN

## 2018-11-14 MED ORDER — LIDOCAINE-EPINEPHRINE 1 %-1:100000 IJ SOLN
INTRAMUSCULAR | Status: DC | PRN
  Administered 2018-11-14: 10 mL

## 2018-11-14 MED ORDER — PHENOL 1.4 % MT LIQD: 1.0000 | OROMUCOSAL | Status: DC | PRN

## 2018-11-14 MED ORDER — ONDANSETRON HCL 4 MG/2ML IJ SOLN
INTRAMUSCULAR | Status: AC
  Filled 2018-11-14: qty 2

## 2018-11-14 MED ORDER — SODIUM CHLORIDE 0.9% FLUSH: 3.0000 mL | INTRAVENOUS | Status: DC | PRN

## 2018-11-14 MED ORDER — DEXAMETHASONE SODIUM PHOSPHATE 10 MG/ML IJ SOLN
INTRAMUSCULAR | Status: DC | PRN
  Administered 2018-11-14: 10 mg via INTRAVENOUS

## 2018-11-14 MED ORDER — BUPIVACAINE LIPOSOME 1.3 % IJ SUSP
20.0000 mL | Freq: Once | INTRAMUSCULAR | Status: DC
  Filled 2018-11-14: qty 20

## 2018-11-14 MED ORDER — ACETAMINOPHEN 325 MG PO TABS: 650.0000 mg | ORAL_TABLET | ORAL | Status: DC | PRN

## 2018-11-14 MED ORDER — PROPOFOL 10 MG/ML IV BOLUS
INTRAVENOUS | Status: AC
  Filled 2018-11-14: qty 20

## 2018-11-14 MED ORDER — DEXAMETHASONE SODIUM PHOSPHATE 10 MG/ML IJ SOLN
10.0000 mg | Freq: Once | INTRAMUSCULAR | Status: DC
  Filled 2018-11-14: qty 1

## 2018-11-14 MED ORDER — CYCLOBENZAPRINE HCL 10 MG PO TABS
ORAL_TABLET | ORAL | Status: AC
  Administered 2018-11-14: 10 mg via ORAL
  Filled 2018-11-14: qty 1

## 2018-11-14 SURGICAL SUPPLY — 64 items
BAG DECANTER FOR FLEXI CONT (MISCELLANEOUS) ×3 IMPLANT
BASKET BONE COLLECTION (BASKET) ×3 IMPLANT
BENZOIN TINCTURE PRP APPL 2/3 (GAUZE/BANDAGES/DRESSINGS) ×3 IMPLANT
BLADE CLIPPER SURG (BLADE) ×3 IMPLANT
BLADE SURG 11 STRL SS (BLADE) ×3 IMPLANT
BONE VIVIGEN FORMABLE 5.4CC (Bone Implant) ×3 IMPLANT
BUR CUTTER 7.0 ROUND (BURR) ×3 IMPLANT
BUR MATCHSTICK NEURO 3.0 LAGG (BURR) ×3 IMPLANT
CANISTER SUCT 3000ML PPV (MISCELLANEOUS) ×3 IMPLANT
CAP LOCKING THREADED (Cap) ×12 IMPLANT
CARTRIDGE OIL MAESTRO DRILL (MISCELLANEOUS) ×1 IMPLANT
CLOSURE WOUND 1/2 X4 (GAUZE/BANDAGES/DRESSINGS) ×1
CONT SPEC 4OZ CLIKSEAL STRL BL (MISCELLANEOUS) ×3 IMPLANT
COVER BACK TABLE 60X90IN (DRAPES) ×3 IMPLANT
DERMABOND ADVANCED (GAUZE/BANDAGES/DRESSINGS) ×2
DERMABOND ADVANCED .7 DNX12 (GAUZE/BANDAGES/DRESSINGS) ×1 IMPLANT
DIFFUSER DRILL AIR PNEUMATIC (MISCELLANEOUS) ×3 IMPLANT
DRAPE C-ARM 42X72 X-RAY (DRAPES) ×6 IMPLANT
DRAPE LAPAROTOMY 100X72X124 (DRAPES) ×3 IMPLANT
DRAPE SURG 17X23 STRL (DRAPES) ×3 IMPLANT
DRSG OPSITE 4X5.5 SM (GAUZE/BANDAGES/DRESSINGS) ×3 IMPLANT
DRSG OPSITE POSTOP 4X6 (GAUZE/BANDAGES/DRESSINGS) ×3 IMPLANT
DURAPREP 26ML APPLICATOR (WOUND CARE) ×3 IMPLANT
ELECT REM PT RETURN 9FT ADLT (ELECTROSURGICAL) ×3
ELECTRODE REM PT RTRN 9FT ADLT (ELECTROSURGICAL) ×1 IMPLANT
EVACUATOR 3/16  PVC DRAIN (DRAIN)
EVACUATOR 3/16 PVC DRAIN (DRAIN) IMPLANT
GAUZE SPONGE 4X4 12PLY STRL (GAUZE/BANDAGES/DRESSINGS) ×3 IMPLANT
GLOVE BIO SURGEON STRL SZ 6.5 (GLOVE) ×4 IMPLANT
GLOVE BIO SURGEON STRL SZ7 (GLOVE) ×6 IMPLANT
GLOVE BIO SURGEON STRL SZ8 (GLOVE) ×6 IMPLANT
GLOVE BIO SURGEONS STRL SZ 6.5 (GLOVE) ×2
GLOVE BIOGEL PI IND STRL 7.0 (GLOVE) ×3 IMPLANT
GLOVE BIOGEL PI IND STRL 7.5 (GLOVE) ×2 IMPLANT
GLOVE BIOGEL PI INDICATOR 7.0 (GLOVE) ×6
GLOVE BIOGEL PI INDICATOR 7.5 (GLOVE) ×4
GLOVE INDICATOR 8.5 STRL (GLOVE) ×6 IMPLANT
GOWN STRL REUS W/ TWL LRG LVL3 (GOWN DISPOSABLE) ×3 IMPLANT
GOWN STRL REUS W/ TWL XL LVL3 (GOWN DISPOSABLE) ×2 IMPLANT
GOWN STRL REUS W/TWL 2XL LVL3 (GOWN DISPOSABLE) IMPLANT
GOWN STRL REUS W/TWL LRG LVL3 (GOWN DISPOSABLE) ×6
GOWN STRL REUS W/TWL XL LVL3 (GOWN DISPOSABLE) ×4
KIT BASIN OR (CUSTOM PROCEDURE TRAY) ×3 IMPLANT
KIT TURNOVER KIT B (KITS) ×3 IMPLANT
MILL MEDIUM DISP (BLADE) ×3 IMPLANT
NEEDLE HYPO 25X1 1.5 SAFETY (NEEDLE) ×3 IMPLANT
NS IRRIG 1000ML POUR BTL (IV SOLUTION) ×3 IMPLANT
OIL CARTRIDGE MAESTRO DRILL (MISCELLANEOUS) ×3
PACK LAMINECTOMY NEURO (CUSTOM PROCEDURE TRAY) ×3 IMPLANT
ROD CREO 45MM SPINAL (Rod) ×6 IMPLANT
SCREW AMP MODULAR CREO 6.5X45 (Screw) ×6 IMPLANT
SCREW CREO 6.5X40 (Screw) ×6 IMPLANT
SCREW PA THRD CREO TULIP 5.5X4 (Head) ×12 IMPLANT
SPACER SUSTAIN RT 12 8D 9X26 (Spacer) ×6 IMPLANT
SPONGE SURGIFOAM ABS GEL 100 (HEMOSTASIS) ×3 IMPLANT
STRIP CLOSURE SKIN 1/2X4 (GAUZE/BANDAGES/DRESSINGS) ×2 IMPLANT
SUT VIC AB 0 CT1 18XCR BRD8 (SUTURE) ×1 IMPLANT
SUT VIC AB 0 CT1 8-18 (SUTURE) ×2
SUT VIC AB 2-0 CT1 18 (SUTURE) ×3 IMPLANT
SUT VIC AB 4-0 PS2 27 (SUTURE) ×3 IMPLANT
TOWEL GREEN STERILE (TOWEL DISPOSABLE) ×3 IMPLANT
TOWEL GREEN STERILE FF (TOWEL DISPOSABLE) ×3 IMPLANT
TRAY FOLEY MTR SLVR 16FR STAT (SET/KITS/TRAYS/PACK) ×3 IMPLANT
WATER STERILE IRR 1000ML POUR (IV SOLUTION) ×3 IMPLANT

## 2018-11-14 NOTE — Evaluation (Signed)
Physical Therapy Evaluation Patient Details Name: Eric RollingJoseph K Crear MRN: [id number] DOB: 1981-02-03 Today's Date: 11/14/2018   History of Present Illness  Patient is a 38 year old male admitted for L5-S1 lumbar fusion due to spondylolesthesis, spinal stenosis.  Clinical Impression  Patient received in bed, wife present. Reports 3/10 pain in low back. Just received pain medicine prior to my arrival. Educated patient in log Lane technique to come to sitting at the side of the bed. Required min assist and cues to perform. Educated patient in donning lumbar corset in seated position. Verbalized understanding, requires reinforcement. Patient ambulated to bathroom and back to recliner with RW. Then ambulated another 100 feet with RW and min guard assist. Patient will benefit from continued PT acutely to improve safety with mobility and activity tolerance.     Follow Up Recommendations Home health PT    Equipment Recommendations  Lane walker with 5" wheels;3in1 (PT)    Recommendations for Other Services       Precautions / Restrictions Precautions Precautions: Fall Precaution Comments: mod fall Required Braces or Orthoses: Spinal Brace Spinal Brace: Lumbar corset Restrictions Weight Bearing Restrictions: No Other Position/Activity Restrictions: no bending lifting twisting arching      Mobility  Bed Mobility Overal bed mobility: Needs Assistance Bed Mobility: Lane;Supine to Sit Lane: Min assist   Supine to sit: Min assist     General bed mobility comments: cues for log Lane, assist to raise trunk to sitting position  Transfers Overall transfer level: Needs assistance Equipment used: Lane walker (2 wheeled) Transfers: Sit to/from Stand Sit to Stand: Min guard            Ambulation/Gait Ambulation/Gait assistance: LandMin guard Gait Distance (Feet): 150 Feet Assistive device: Lane walker (2 wheeled) Gait Pattern/deviations: Step-through pattern;Decreased  stride length Gait velocity: decreased   General Gait Details: patient reports feeling "woozieEngineer, maintenance (IT)"  Stairs            Wheelchair Mobility    Modified Rankin (Stroke Patients Only)       Balance Overall balance assessment: Modified Independent                                           Pertinent Vitals/Pain Pain Assessment: 0-10 Pain Score: 3  Pain Location: Low back Pain Descriptors / Indicators: Aching;Discomfort;Operative site guarding;Sore Pain Intervention(s): Monitored during session;Premedicated before session;Repositioned    Home Living Family/patient expects to be discharged to:: Private residence Living Arrangements: Spouse/significant other Available Help at Discharge: Family;Available 24 hours/day Type of Home: House Home Access: Stairs to enter Entrance Stairs-Rails: Can reach both Entrance Stairs-Number of Steps: 4 Home Layout: Two level;1/2 bath on main level Home Equipment: None      Prior Function                 Hand Dominance        Extremity/Trunk Assessment   Upper Extremity Assessment Upper Extremity Assessment: Overall WFL for tasks assessed    Lower Extremity Assessment Lower Extremity Assessment: Generalized weakness    Cervical / Trunk Assessment Cervical / Trunk Assessment: Normal  Communication   Communication: No difficulties  Cognition Arousal/Alertness: Awake/alert Behavior During Therapy: WFL for tasks assessed/performed Overall Cognitive Status: Within Functional Limits for tasks assessed  General Comments      Exercises     Assessment/Plan    PT Assessment Patient needs continued PT services  PT Problem List Decreased mobility;Decreased activity tolerance;Pain;Decreased range of motion       PT Treatment Interventions DME instruction;Therapeutic activities;Gait training;Therapeutic exercise;Stair training;Functional mobility  training;Balance training;Patient/family education    PT Goals (Current goals can be found in the Care Plan section)  Acute Rehab PT Goals Patient Stated Goal: to return home, decrease pain PT Goal Formulation: With patient/family Time For Goal Achievement: 11/21/18 Potential to Achieve Goals: Good    Frequency Min 5X/week   Barriers to discharge        Co-evaluation               AM-PAC PT "6 Clicks" Mobility  Outcome Measure Help needed turning from your back to your side while in a flat bed without using bedrails?: A Little Help needed moving from lying on your back to sitting on the side of a flat bed without using bedrails?: A Little Help needed moving to and from a bed to a chair (including a wheelchair)?: A Little Help needed standing up from a chair using your arms (e.g., wheelchair or bedside chair)?: A Little Help needed to walk in hospital room?: A Little Help needed climbing 3-5 steps with a railing? : A Little 6 Click Score: 18    End of Session Equipment Utilized During Treatment: Gait belt;Back brace Activity Tolerance: Patient tolerated treatment well Patient left: in chair;with call bell/phone within reach;with family/visitor present Nurse Communication: Mobility status PT Visit Diagnosis: Difficulty in walking, not elsewhere classified (R26.2);Pain Pain - Right/Left: (central) Pain - part of body: (back)    Time: 1500-1530 PT Time Calculation (min) (ACUTE ONLY): 30 min   Charges:   PT Evaluation $PT Eval Low Complexity: 1 Low PT Treatments $Gait Training: 8-22 mins        Keighan Amezcua, PT, GCS 11/14/18,3:59 PM

## 2018-11-14 NOTE — Op Note (Signed)
Preoperative diagnosis: Grade 1 spondylolisthesis L5-S1 with severe spinal stenosis bilateral L5 radiculopathies left greater than right  Postoperative diagnosis: Same  Procedure: Gill decompression L5-S1 with complete medial facetectomies partial drilling down to the inferomedial aspect the pedicle with foraminotomies of the L5 and S1 nerve roots  2.  Posterior lumbar interbody fusion L5-S1 utilizing the globus insert and rotate titanium cages packed with locally harvested autograft mixed with vivigen  3.  Pedicle screw fixation L5-S1 utilizing the globus Creo amp modular pedicle screw set  4.  Posterior lateral arthrodesis L5-S1 utilizing locally harvested autograft mixed  Surgeon: Eric Lane  Assistant: Nash Shearer  Anesthesia: General  EBL: Minimal  HPI: Patient very pleasant 38 year old gentleman is a longstanding back and bilateral hip and leg pain work-up revealed a grade 1 spondylolisthesis with motion on flexion-extension L5-S1.  Due to patient failed conservative treatment imaging findings and progression of clinical syndrome I recommended decompression foraminotomies and interbody fusion at L5-S1.  I extensively went over the risks and benefits of that operation with him as well as perioperative course expectations of outcome and alternatives of surgery and he understood and agreed to proceed forward.  Operative procedure: Patient was brought into the OR was induced under general anesthesia positioned prone the Wilson frame his back was prepped and draped in routine sterile fashion.  Preoperative x-Hirano localized the appropriate level so after infiltration of 10 cc lidocaine with epi midline incision was made and Bovie electrocautery was used to take down the subcutaneous tissue and subperiosteal dissection was carried lamina of L5 and S1 exposing the lateral facet complex at 4 5.  Intraoperative x-Mally confirmed defecation appropriate level the spinal laminar complex was noted be  hypermobile I remove the spinous process performed complete facetectomies and a complete central decompressive laminectomy and then dissected all the granulation tissue off of the L5 nerve roots bilaterally drilled down the inferomedial aspect of the L5 pedicle to get access to the L5 foramen unroofed the L5 foramen distally beyond the facet joint.  Then aggressively under Bitton superior to the main facet complex of S1 to gain access to the lateral aspect of the disc base.  Then epidural veins were coagulated the space was incised bilaterally utilizing sequential distraction I reduce the deformity and with an 11 distractor in place I selected 9 mm x 12 mm 8 degree insert and rotate cages impacted with locally harvested autograft mix.  Inserted the left-sided cage packed and aggressive amount of autograft mix centrally and inserted the right-sided cage under fluoroscopy.  This point attention taken to pedicle screw placement utilizing high-speed drill pedicles were identified cannulated probe tapped probed again and six 5 x 45 screws inserted L5 and six 5 x 48 S1.  Fluoroscopy confirmed good position all the implants.  Wounds were copiously irrigated meticulous hemostasis was maintained I aggressively decorticated the TPs lateral facet complexes at L5-S1 packed locally harvested autograft mix posterior laterally.  Then selected a rod anchored all the knots and tightened down at S1 compressed the L5 screw against S1 inspected all the foramina to confirm patency and no migration of graft material leg Gelfoam in the dura.  Injected Exparel in the fascia placed a medium Hemovac drain and closed wound in layers with Vicryl in a running 4 subcuticular.  Dermabond benzoin Steri-Strips and a sterile dressing was applied patient recovery in stable condition.  At the end the case all needle count sponge counts were correct.

## 2018-11-14 NOTE — Transfer of Care (Signed)
Immediate Anesthesia Transfer of Care Note  Patient: KYO COCUZZA  Procedure(s) Performed: LUMBAR FIVE-SACRAL ONE POSTERIOR LUMBAR INTERBODY FUSION (N/A Spine Lumbar)  Patient Location: PACU  Anesthesia Type:General  Level of Consciousness: awake, alert  and oriented  Airway & Oxygen Therapy: Patient Spontanous Breathing and Patient connected to nasal cannula oxygen  Post-op Assessment: Report given to RN and Post -op Vital signs reviewed and stable  Post vital signs: Reviewed and stable  Last Vitals:  Vitals Value Taken Time  BP 129/82 11/14/18 1103  Temp    Pulse 108 11/14/18 1105  Resp 23 11/14/18 1105  SpO2 98 % 11/14/18 1105  Vitals shown include unvalidated device data.  Last Pain:  Vitals:   11/14/18 0607  PainSc: 3       Patients Stated Pain Goal: 3 (70/01/74 9449)  Complications: No apparent anesthesia complications

## 2018-11-14 NOTE — H&P (Signed)
Eric Lane is an 38 y.o. male.   Chief Complaint: Back and left leg pain HPI: 38 year old gentleman with a longstanding history of back pain previously documented grade 1 spondylolisthesis L5-S1 is failed all forms of conservative treatment physical therapy anti-inflammatories and epidural steroid injections.  Due to his progression of clinical syndrome imaging findings and failure of conservative treatment I recommended decompression stabilization procedure at L5-S1 I extensively gone over the risks and benefits of that operation with him as well as perioperative course expectations of outcome and alternatives of surgery and he understood and agreed to proceed forward.  History reviewed. No pertinent past medical history.  Past Surgical History:  Procedure Laterality Date  . EYE SURGERY    . LASIK Bilateral 09/20/2014    Family History  Problem Relation Age of Onset  . Hypertension Mother   . Hyperlipidemia Father   . Heart attack Father   . Diabetes Maternal Grandmother   . COPD Maternal Grandmother   . Congestive Heart Failure Maternal Grandmother   . Cancer Maternal Grandfather   . Alzheimer's disease Paternal Grandmother   . Stomach cancer Paternal Grandfather    Social History:  reports that he quit smoking about 13 years ago. His smoking use included cigarettes. He has a 5.00 pack-year smoking history. His smokeless tobacco use includes chew. He reports current alcohol use of about 2.0 standard drinks of alcohol per week. He reports that he does not use drugs.  Allergies: No Known Allergies  Medications Prior to Admission  Medication Sig Dispense Refill  . acetaminophen (TYLENOL) 500 MG tablet Take 1,000 mg by mouth every 6 (six) hours as needed for mild pain or headache.      No results found for this or any previous visit (from the past 48 hour(s)). No results found.  Review of Systems  Musculoskeletal: Positive for back pain.  Neurological: Positive for tingling and  sensory change.    Blood pressure (!) 158/86, pulse 87, temperature 98.3 F (36.8 C), resp. rate 20, height 5\' 11"  (1.803 m), weight 103.5 kg, SpO2 100 %. Physical Exam  Constitutional: He is oriented to person, place, and time. He appears well-developed.  HENT:  Head: Normocephalic.  Eyes: Pupils are equal, round, and reactive to light.  Neck: Normal range of motion.  Cardiovascular: Normal rate.  Respiratory: Effort normal.  GI: Soft.  Musculoskeletal: Normal range of motion.  Neurological: He is alert and oriented to person, place, and time. He has normal strength. GCS eye subscore is 4. GCS verbal subscore is 5. GCS motor subscore is 6.  Strength is 5 out of 5 iliopsoas, quads, hamstrings, gastroc, into tibialis, and EHL.  Skin: Skin is warm and dry.     Assessment/Plan 38 year old presents for decompression fusion L5-S1  Durinda Buzzelli P, MD 11/14/2018, 7:13 AM

## 2018-11-14 NOTE — Anesthesia Procedure Notes (Signed)
Procedure Name: Intubation Date/Time: 11/14/2018 7:47 AM Performed by: Inda Coke, CRNA Pre-anesthesia Checklist: Patient identified, Emergency Drugs available, Suction available and Patient being monitored Patient Re-evaluated:Patient Re-evaluated prior to induction Oxygen Delivery Method: Circle System Utilized Preoxygenation: Pre-oxygenation with 100% oxygen Induction Type: IV induction Ventilation: Mask ventilation without difficulty and Oral airway inserted - appropriate to patient size Laryngoscope Size: Mac and 4 Grade View: Grade I Tube type: Oral Tube size: 7.5 mm Number of attempts: 1 Airway Equipment and Method: Stylet and Oral airway Placement Confirmation: ETT inserted through vocal cords under direct vision,  positive ETCO2 and breath sounds checked- equal and bilateral Secured at: 22 cm Tube secured with: Tape Dental Injury: Teeth and Oropharynx as per pre-operative assessment

## 2018-11-15 MED ORDER — METHOCARBAMOL 500 MG PO TABS: 500.0000 mg | ORAL_TABLET | Freq: Four times a day (QID) | ORAL | 0 refills | Status: DC

## 2018-11-15 MED ORDER — HYDROCODONE-ACETAMINOPHEN 5-325 MG PO TABS: 1.0000 | ORAL_TABLET | ORAL | 0 refills | Status: DC | PRN

## 2018-11-15 MED ORDER — HYDROCODONE-ACETAMINOPHEN 5-325 MG PO TABS
1.0000 | ORAL_TABLET | ORAL | Status: DC | PRN
  Administered 2018-11-15: 1 via ORAL
  Filled 2018-11-15: qty 1

## 2018-11-15 NOTE — Progress Notes (Signed)
Physical Therapy Treatment Patient Details Name: Eric Lane MRN: [id number] DOB: 14-Jan-1981 Today's Date: 11/15/2018    History of Present Illness Patient is a 38 year old male admitted for L5-S1 lumbar fusion due to spondylolesthesis, spinal stenosis.    PT Comments    Pt progressing well with post-op mobility. Reinforced education regarding precautions, car transfer, stair negotiation, activity progression, and positioning recommendations. Pt's main concern is correct form with bed mobility. He was able to demonstrate transition to/from EOB with proper log roll technique x2 and with modified independence. Pt anticipates d/c home today. Based on current level of function, feel HHPT is no longer necessary and pt is agreeable. Will sign off at this time as pt has met acute care PT goals. If needs change, please reconsult.   Follow Up Recommendations  No PT follow up;Supervision - Intermittent     Equipment Recommendations  None recommended by PT    Recommendations for Other Services       Precautions / Restrictions Precautions Precautions: Back Precaution Booklet Issued: Yes (comment) Precaution Comments: Discussed precautions in detail during functional mobility.  Required Braces or Orthoses: Spinal Brace Spinal Brace: Lumbar corset;Applied in sitting position Restrictions Weight Bearing Restrictions: No    Mobility  Bed Mobility Overal bed mobility: Modified Independent Bed Mobility: Rolling;Sidelying to Sit;Sit to Sidelying Rolling: Independent Sidelying to sit: Modified independent (Device/Increase time)     Sit to sidelying: Modified independent (Device/Increase time) General bed mobility comments: Increased time and cues for optimal log roll technique. No assist required. Practiced with height of bed elevated and rails lowered to simulate home environment.   Transfers Overall transfer level: Modified independent Equipment used: None Transfers: Sit to/from  Stand           General transfer comment: No assist. Pt with good technique and maintenance of upright posture.   Ambulation/Gait Ambulation/Gait assistance: Modified independent (Device/Increase time) Gait Distance (Feet): 800 Feet Assistive device: None Gait Pattern/deviations: WFL(Within Functional Limits) Gait velocity: decreased Gait velocity interpretation: 1.31 - 2.62 ft/sec, indicative of limited community ambulator General Gait Details: Slow but generally steady without AD. Pt was able to increase gait speed with cues.    Stairs Stairs: Yes Stairs assistance: Modified independent (Device/Increase time) Stair Management: One rail Right;Step to pattern;Alternating pattern;Forwards Number of Stairs: 10 General stair comments: VC's for sequencing and general safety. Pt was able to complete without assistance.    Wheelchair Mobility    Modified Rankin (Stroke Patients Only)       Balance Overall balance assessment: Modified Independent                                          Cognition Arousal/Alertness: Awake/alert Behavior During Therapy: WFL for tasks assessed/performed Overall Cognitive Status: Within Functional Limits for tasks assessed                                        Exercises      General Comments        Pertinent Vitals/Pain Pain Assessment: Faces Faces Pain Scale: Hurts a little bit Pain Location: Low back/Incision site Pain Descriptors / Indicators: Discomfort;Operative site guarding;Sore Pain Intervention(s): Limited activity within patient's tolerance;Monitored during session;Repositioned    Home Living Family/patient expects to be discharged to:: Private residence Living Arrangements: Spouse/significant  other Available Help at Discharge: Family;Available 24 hours/day Type of Home: House Home Access: Stairs to enter Entrance Stairs-Rails: Can reach both Home Layout: Two level;1/2 bath on main  level;Bed/bath upstairs Home Equipment: None      Prior Function Level of Independence: Independent          PT Goals (current goals can now be found in the care plan section) Acute Rehab PT Goals Patient Stated Goal: to return home, decrease pain PT Goal Formulation: With patient/family Time For Goal Achievement: 11/21/18 Potential to Achieve Goals: Good Progress towards PT goals: Progressing toward goals    Frequency    Min 5X/week      PT Plan Discharge plan needs to be updated    Co-evaluation              AM-PAC PT "6 Clicks" Mobility   Outcome Measure  Help needed turning from your back to your side while in a flat bed without using bedrails?: None Help needed moving from lying on your back to sitting on the side of a flat bed without using bedrails?: None Help needed moving to and from a bed to a chair (including a wheelchair)?: None Help needed standing up from a chair using your arms (e.g., wheelchair or bedside chair)?: None Help needed to walk in hospital room?: None Help needed climbing 3-5 steps with a railing? : None 6 Click Score: 24    End of Session Equipment Utilized During Treatment: Back brace Activity Tolerance: Patient tolerated treatment well Patient left: in chair;with call bell/phone within reach Nurse Communication: Mobility status PT Visit Diagnosis: Difficulty in walking, not elsewhere classified (R26.2);Pain Pain - Right/Left: (central) Pain - part of body: (back)     Time: 0076-2263 PT Time Calculation (min) (ACUTE ONLY): 34 min  Charges:  $Gait Training: 23-37 mins                     Rolinda Roan, PT, DPT Acute Rehabilitation Services Pager: [phone] Office: [phone]    Thelma Comp 11/15/2018, 10:43 AM

## 2018-11-15 NOTE — Anesthesia Postprocedure Evaluation (Signed)
Anesthesia Post Note  Patient: DEKOTA SHENK  Procedure(s) Performed: LUMBAR FIVE-SACRAL ONE POSTERIOR LUMBAR INTERBODY FUSION (N/A Spine Lumbar)     Patient location during evaluation: PACU Anesthesia Type: General Level of consciousness: awake and alert Pain management: pain level controlled Vital Signs Assessment: post-procedure vital signs reviewed and stable Respiratory status: spontaneous breathing, nonlabored ventilation, respiratory function stable and patient connected to nasal cannula oxygen Cardiovascular status: blood pressure returned to baseline and stable Postop Assessment: no apparent nausea or vomiting Anesthetic complications: no    Last Vitals:  Vitals:   11/15/18 0410 11/15/18 0719  BP: 118/74 119/76  Pulse: (!) 48 64  Resp: 18 16  Temp: 36.4 C 36.6 C  SpO2: 100% 100%    Last Pain:  Vitals:   11/15/18 0719  TempSrc: Oral  PainSc:                  Lyman S

## 2018-11-15 NOTE — Discharge Instructions (Signed)
Wound Care ° °Keep the incision clean and dry remove the outer dressing in 2 days, leave the Steri-Strips intact. Wrap with Saran wrap for showers only °Do not put any creams, lotions, or ointments on incision. °Leave steri-strips on back.  They will fall off by themselves. ° °Activity °Walk each and every day, increasing distance each day. °No lifting greater than 5 lbs.  °No lifting no bending no twisting no driving or riding a car unless coming back and forth to see me. °If provided with back brace, wear when out of bed.  It is not necessary to wear brace in bed. °Diet °Resume your normal diet.  ° °Return to Work °Will be discussed at you follow up appointment. ° °Call Your Doctor If Any of These Occur °Redness, drainage, or swelling at the wound.  °Temperature greater than 101 degrees. °Severe pain not relieved by pain medication. °Incision starts to come apart. °Follow Up Appt °Call today for appointment in 1-2 weeks ([phone]) or for problems.  If you have any hardware placed in your spine, you will need an x-Fodera before your appointment. ° ° °

## 2018-11-15 NOTE — Discharge Summary (Signed)
Physician Discharge Summary  Patient ID: Eric RollingJoseph K Lane MRN: [id number] DOB/AGE: [birth year] 38 y.o.  Admit date: 11/14/2018 Discharge date: 11/15/2018  Admission Diagnoses: Grade 1 spondylolisthesis L5-S1 with severe spinal stenosis bilateral L5 radiculopathies left greater than right    Discharge Diagnoses: same   Discharged Condition: good  Hospital Course: The patient was admitted on 11/14/2018 and taken to the operating room where the patient underwent PLIF L5-S1. The patient tolerated the procedure well and was taken to the recovery room and then to the floor in stable condition. The hospital course was routine. There were no complications. The wound remained clean dry and intact. Pt had appropriate back soreness. No complaints of leg pain or new N/T/W. The patient remained afebrile with stable vital signs, and tolerated a regular diet. The patient continued to increase activities, and pain was well controlled with oral pain medications.   Consults: None  Significant Diagnostic Studies:  Results for orders placed or performed during the hospital encounter of 11/10/18  Novel Coronavirus, NAA (Hosp order, Send-out to Ref Lab; TAT 18-24 hrs   Specimen: Nasopharyngeal Swab; Respiratory  Result Value Ref Range   SARS-CoV-2, NAA NOT DETECTED NOT DETECTED   Coronavirus Source NASOPHARYNGEAL   ABO/Rh  Result Value Ref Range   ABO/RH(D)      O POS Performed at [facility], [address] [place], [place] [address]     Dg Lumbar Spine 2-3 Views  Result Date: 11/14/2018 CLINICAL DATA:  Intraoperative imaging for L5-S1 fusion. EXAM: LUMBAR SPINE - 2-3 VIEW; DG C-ARM 1-60 MIN COMPARISON:  Plain films lumbar spine 02/19/2017. FINDINGS: Two fluoroscopic spot views of the lower lumbar spine demonstrate pedicle screws and interbody spacer in place at L5-S1. Bilateral L5 pars interarticularis defects are noted. There is grade 1 anterolisthesis L5 on S1. IMPRESSION: Intraoperative imaging  for L5-S1 PLIF. Electronically Signed   By: Drusilla Kannerhomas  Dalessio M.D.   On: 11/14/2018 10:46   Dg C-arm 1-60 Min  Result Date: 11/14/2018 CLINICAL DATA:  Intraoperative imaging for L5-S1 fusion. EXAM: LUMBAR SPINE - 2-3 VIEW; DG C-ARM 1-60 MIN COMPARISON:  Plain films lumbar spine 02/19/2017. FINDINGS: Two fluoroscopic spot views of the lower lumbar spine demonstrate pedicle screws and interbody spacer in place at L5-S1. Bilateral L5 pars interarticularis defects are noted. There is grade 1 anterolisthesis L5 on S1. IMPRESSION: Intraoperative imaging for L5-S1 PLIF. Electronically Signed   By: Drusilla Kannerhomas  Dalessio M.D.   On: 11/14/2018 10:46    Antibiotics:  Anti-infectives (From admission, onward)   Start     Dose/Rate Route Frequency Ordered Stop   11/14/18 1530  ceFAZolin (ANCEF) IVPB 2g/100 mL premix     2 g 200 mL/hr over 30 Minutes Intravenous Every 8 hours 11/14/18 1220 11/14/18 2255   11/14/18 0838  bacitracin 50,000 Units in sodium chloride 0.9 % 500 mL irrigation  Status:  Discontinued       As needed 11/14/18 0840 11/14/18 1059   11/14/18 0600  ceFAZolin (ANCEF) IVPB 2g/100 mL premix     2 g 200 mL/hr over 30 Minutes Intravenous On call to O.R. 11/14/18 0555 11/14/18 0803      Discharge Exam: Blood pressure 119/76, pulse 64, temperature 97.8 F (36.6 C), temperature source Oral, resp. rate 16, height 5\' 11"  (1.803 m), weight 103.5 kg, SpO2 100 %. Neurologic: Grossly normal ambualting and voiding well, incision cdi  Discharge Medications:   Allergies as of 11/15/2018   No Known Allergies     Medication List  TAKE these medications   acetaminophen 500 MG tablet Commonly known as: TYLENOL Take 1,000 mg by mouth every 6 (six) hours as needed for mild pain or headache.   HYDROcodone-acetaminophen 5-325 MG tablet Commonly known as: NORCO/VICODIN Take 1 tablet by mouth every 4 (four) hours as needed for moderate pain.   methocarbamol 500 MG tablet Commonly known as:  Robaxin Take 1 tablet (500 mg total) by mouth 4 (four) times daily.            Durable Medical Equipment  (From admission, onward)         Start     Ordered   11/14/18 1535  For home use only DME 3 n 1  Once     11/14/18 1535          Disposition: home   Final Dx: PLIF L5-S1  Discharge Instructions     Remove dressing in 72 hours   Complete by: As directed    Call MD for:  difficulty breathing, headache or visual disturbances   Complete by: As directed    Call MD for:  hives   Complete by: As directed    Call MD for:  persistant dizziness or light-headedness   Complete by: As directed    Call MD for:  persistant nausea and vomiting   Complete by: As directed    Call MD for:  redness, tenderness, or signs of infection (pain, swelling, redness, odor or green/yellow discharge around incision site)   Complete by: As directed    Call MD for:  severe uncontrolled pain   Complete by: As directed    Call MD for:  temperature >100.4   Complete by: As directed    Diet - low sodium heart healthy   Complete by: As directed    Driving Restrictions   Complete by: As directed    No driving for 2 weeks, no riding in the car for 1 week   Increase activity slowly   Complete by: As directed    Lifting restrictions   Complete by: As directed    No lifting more than 8 lbs         Signed: Ocie Cornfield Azariah Latendresse 11/15/2018, 8:20 AM

## 2018-11-15 NOTE — Progress Notes (Signed)
Occupational Therapy Evaluation Patient Details Name: Eric Lane MRN: [id number] DOB: 11/21/1980 Today's Date: 11/15/2018    History of Present Illness Patient is a 38 year old male admitted for L5-S1 lumbar fusion due to spondylolesthesis, spinal stenosis.   Clinical Impression   PTA, pt lived at home with his wife and was independent for all ADLs, IADLs, and driving. Pt currently presents with pain of low back. Pt recalled 3/3 back precautions. Provided education regarding compensatory strategies for completing ADLs. Pt performed UB and LB dressing with supervision for maintaining back precautions and VCs for using compensatory strategies. Pt performed toileting with supervision for safety and maintaining precautions. Recommend dc home with no follow up OT once medically stable per physician. Provided all education and all acute OT needs met. Will sign off.     Follow Up Recommendations  No OT follow up    Equipment Recommendations  None recommended by OT    Recommendations for Other Services       Precautions / Restrictions Precautions Precautions: Back Precaution Booklet Issued: Yes (comment) Precaution Comments: No bending, lifting, or twisting Required Braces or Orthoses: Spinal Brace Spinal Brace: Lumbar corset Restrictions Weight Bearing Restrictions: No      Mobility Bed Mobility Overal bed mobility: Modified Independent Bed Mobility: Rolling;Sidelying to Sit Rolling: Independent Sidelying to sit: Modified independent (Device/Increase time)(increased time)     Sit to sidelying: Modified independent (Device/Increase time) General bed mobility comments: required increased time to perform bed mobility. Demonstrated bed mobility with bed flat and no rail to simulate home environment  Transfers Overall transfer level: Modified independent Equipment used: None Transfers: Sit to/from Stand           General transfer comment: required increased time     Balance Overall balance assessment: Modified Independent                                         ADL either performed or assessed with clinical judgement   ADL Overall ADL's : Needs assistance/impaired Eating/Feeding: Independent   Grooming: Wash/dry hands;Modified independent;Standing Grooming Details (indicate cue type and reason): performed hand hygiene with mod I for increased time Upper Body Bathing: Supervision/ safety;Sitting   Lower Body Bathing: Supervison/ safety;Sit to/from stand   Upper Body Dressing : Supervision/safety;Sitting;Cueing for safety Upper Body Dressing Details (indicate cue type and reason): Pt donned shirt with supervision for safety and maintaining precautions. Pt donned back brace with supervision and VCs to maintain precautions Lower Body Dressing: Supervision/safety;Cueing for safety;Cueing for compensatory techniques;Sit to/from stand Lower Body Dressing Details (indicate cue type and reason): provided education on figure 4 method for donning LB clothing. Pt performed LB dressing using figure 4 method with supervision for maintaining precautions Toilet Transfer: Supervision/safety;Ambulation;Regular Glass blower/designer Details (indicate cue type and reason): pt demonstrated toilet transfer with supervision for safety Toileting- Clothing Manipulation and Hygiene: Sit to/from stand;Supervision/safety Toileting - Clothing Manipulation Details (indicate cue type and reason): required supervision for maintaining precautions     Functional mobility during ADLs: Supervision/safety General ADL Comments: Pt performed most ADLs with supervision for safety and maintaining back precautions     Vision         Perception     Praxis      Pertinent Vitals/Pain Pain Assessment: Faces Faces Pain Scale: Hurts little more Pain Location: Low back Pain Descriptors / Indicators: Aching;Discomfort;Operative site guarding;Sore Pain  Intervention(s):  Monitored during session;Premedicated before session     Hand Dominance     Extremity/Trunk Assessment Upper Extremity Assessment Upper Extremity Assessment: Overall WFL for tasks assessed   Lower Extremity Assessment Lower Extremity Assessment: Defer to PT evaluation   Cervical / Trunk Assessment Cervical / Trunk Assessment: Normal   Communication Communication Communication: No difficulties   Cognition Arousal/Alertness: Awake/alert Behavior During Therapy: WFL for tasks assessed/performed Overall Cognitive Status: Within Functional Limits for tasks assessed                                     General Comments       Exercises     Shoulder Instructions      Home Living Family/patient expects to be discharged to:: Private residence Living Arrangements: Spouse/significant other Available Help at Discharge: Family;Available 24 hours/day Type of Home: House Home Access: Stairs to enter [employer] of Steps: 3 Entrance Stairs-Rails: Can reach both Home Layout: Two level;1/2 bath on main level;Bed/bath upstairs Alternate Level Stairs-Number of Steps: 13 Alternate Level Stairs-Rails: Left Bathroom Shower/Tub: Occupational psychologist: Standard     Home Equipment: None          Prior Functioning/Environment Level of Independence: Independent                 OT Problem List: Decreased activity tolerance;Pain      OT Treatment/Interventions:      OT Goals(Current goals can be found in the care plan section) Acute Rehab OT Goals Patient Stated Goal: to return home, decrease pain OT Goal Formulation: All assessment and education complete, DC therapy  OT Frequency:     Barriers to D/C:            Co-evaluation              AM-PAC OT "6 Clicks" Daily Activity     Outcome Measure Help from another person eating meals?: None Help from another person taking care of personal grooming?: None Help  from another person toileting, which includes using toliet, bedpan, or urinal?: None Help from another person bathing (including washing, rinsing, drying)?: None Help from another person to put on and taking off regular upper body clothing?: None Help from another person to put on and taking off regular lower body clothing?: None 6 Click Score: 24   End of Session Equipment Utilized During Treatment: Back brace Nurse Communication: Mobility status  Activity Tolerance: Patient tolerated treatment well Patient left: in chair;with call bell/phone within reach  OT Visit Diagnosis: Pain Pain - part of body: (back)                Time: 0626-9485 OT Time Calculation (min): 27 min Charges:  OT General Charges $OT Visit: 1 Visit OT Evaluation $OT Eval Low Complexity: 1 Low OT Treatments $Self Care/Home Management : 8-22 mins  Gus Rankin, OT Student  Gus Rankin 11/15/2018, 12:36 PM

## 2018-11-15 NOTE — Plan of Care (Signed)
Patient alert and oriented, mae's well, voiding adequate amount of urine, swallowing without difficulty, no c/o pain at time of discharge. Patient discharged home with family. Script and discharged instructions given to patient. Patient and family stated understanding of instructions given. Patient has an appointment with Dr. Cram 

## 2018-11-22 ENCOUNTER — Telehealth: Payer: Self-pay | Admitting: Physician Assistant

## 2018-11-22 DIAGNOSIS — K649 Unspecified hemorrhoids: Secondary | ICD-10-CM

## 2018-11-22 MED ORDER — HYDROCORTISONE (PERIANAL) 2.5 % EX CREA: 1.0000 "application " | TOPICAL_CREAM | Freq: Two times a day (BID) | CUTANEOUS | 0 refills | Status: DC

## 2018-11-22 NOTE — Telephone Encounter (Signed)
Sent in [place]. If not improving he should call.

## 2018-11-22 NOTE — Telephone Encounter (Signed)
Patient had back surgery last Monday and he is constipated and has a hemorrhoid which is not a new issue.  In the past, his physician gave him "Proctolsol 2.5 %" for hemorrhoids.   He said it's a pretty big hemorrhoid and has a picture of it if you need to see it to determine method of treatment. I told him Tawanna Sat doesn't come in until 1:30 today.   He uses [facility] on [address]. Please advise patient ASAP as he is in pain.

## 2018-11-22 NOTE — Telephone Encounter (Signed)
Please advise 

## 2018-11-22 NOTE — Telephone Encounter (Signed)
Patient was advised.  

## 2018-11-24 ENCOUNTER — Encounter (HOSPITAL_COMMUNITY): Payer: Self-pay | Admitting: Neurosurgery

## 2018-12-27 ENCOUNTER — Ambulatory Visit: Payer: Commercial Managed Care - PPO

## 2018-12-29 ENCOUNTER — Other Ambulatory Visit: Payer: Self-pay

## 2018-12-29 ENCOUNTER — Ambulatory Visit: Payer: Commercial Managed Care - PPO | Attending: Neurosurgery

## 2018-12-29 DIAGNOSIS — M6281 Muscle weakness (generalized): Secondary | ICD-10-CM | POA: Insufficient documentation

## 2018-12-29 DIAGNOSIS — G8929 Other chronic pain: Secondary | ICD-10-CM

## 2018-12-29 DIAGNOSIS — M545 Low back pain: Secondary | ICD-10-CM | POA: Insufficient documentation

## 2018-12-29 DIAGNOSIS — R2689 Other abnormalities of gait and mobility: Secondary | ICD-10-CM | POA: Diagnosis present

## 2018-12-29 NOTE — Patient Instructions (Signed)
Access Code: [email or web address]  URL: [email or web address]  Date: 12/29/2018  Prepared by: Janna Arch   Exercises Supine Transversus Abdominis Bracing - Hands on Stomach - 10 reps - 2 sets - 5 hold - 1x daily - 7x weekly Seated Hamstring Stretch - 2 reps - 2 sets - 30 hold - 1x daily - 7x weekly Seated Piriformis Stretch - 2 reps - 2 sets - 30 hold - 1x daily - 7x weekly

## 2018-12-29 NOTE — Therapy (Signed)
[facility] MAIN [facility] SERVICES [address] [place], [place], [address] Phone: [phone]   Fax:  [phone]  Physical Therapy Evaluation  Patient Details  Name: Eric Lane MRN: [id number] Date of Birth: 09/03/1980 Referring Provider (PT): Dr. Wynetta Emery   Encounter Date: 12/29/2018  PT End of Session - 12/29/18 1731    Visit Number  1    Number of Visits  16    Date for PT Re-Evaluation  02/23/19    Authorization Type  1/10 eval 12/29/18    PT Start Time  1601    PT Stop Time  1700    PT Time Calculation (min)  59 min    Equipment Utilized During Treatment  Back brace    Activity Tolerance  Patient tolerated treatment well    Behavior During Therapy  [facility] for tasks assessed/performed       History reviewed. No pertinent past medical history.  Past Surgical History:  Procedure Laterality Date  . EYE SURGERY    . LASIK Bilateral 09/20/2014    There were no vitals filed for this visit.   Subjective Assessment - 12/29/18 1614    Subjective  Patient is a pleasant 38 year old male who presents s/p PLIF L5-S1 on 11/14/18    Pertinent History  Patient received surgery on 11/14/18 for grade I spondylolisthesis L5-S1 with severe spinal stenosis bilateral L5 radiculopathies left greater than right, he underwent PLIF L5-S1. Has seen this therapist earlier this year prior to this surgery and COVID-19 outbreak. Per patient no extension, no sit ups, is ok to do planks. Patient presents in back brace, is supposed to be in for 2 months s/p surgery.  Tingling numbness in big toe of right side. Returned back to work Monday. More sore in the middle of the night.    Limitations  Standing;Walking;Sitting;Lifting;House hold activities    How long can you sit comfortably?  20-30 minutes    How long can you stand comfortably?  hasn't stood still for long duration    How long can you walk comfortably?  has walked 3 miles    Diagnostic tests  s/p surgery     Patient Stated Goals  improve gait/strength, return to biking, golfing, running    Currently in Pain?  Yes    Pain Score  1     Pain Location  Back    Pain Orientation  Lower    Pain Descriptors / Indicators  Aching    Pain Type  Chronic pain    Pain Radiating Towards  big toe numness of LLE    Pain Onset  More than a month ago    Pain Frequency  Intermittent    Aggravating Factors   worse in the morning or prolonged sitting    Pain Relieving Factors  walking         [facility] PT Assessment - 12/29/18 0001      Assessment   Medical Diagnosis  s/p PLIF L5-S1    Referring Provider (PT)  Dr. Wynetta Emery    Onset Date/Surgical Date  11/14/18   sx 11/14/18 onset 10/31/2016   Prior Therapy  yes, here prior to COVID      Precautions   Precautions  Back    Precaution Comments  L5-S1 PLIF      Restrictions   Weight Bearing Restrictions  Yes    Other Position/Activity Restrictions  no twisting, extension, lifting heavy weights      Balance Screen  Has the patient fallen in the past 6 months  No    Has the patient had a decrease in activity level because of a fear of falling?   No    Is the patient reluctant to leave their home because of a fear of falling?   No      Home Nurse, mental healthnvironment   Living Environment  Private residence    Living Arrangements  Spouse/significant other;Children    Available Help at Discharge  Family    Type of Home  House    Home Access  Stairs to enter      Prior Function   Level of Independence  Independent    Vocation  Full time employment    Vocation Requirements  Works at [employer] in [employer]    Leisure  Cycling, golf      Cognition   Overall Cognitive Status  Within [employer] for tasks assessed      Observation/Other Assessments   Modified Oswestry  36%            PAIN: Worst pain: 3/10 Current pain: 1/10 -achy more on lateral aspects of spine   Hears a clicking/popping in back above surgical sight   POSTURE: Use of back  brace in seated Standing: slight trunk flexion, weight shift on RLE  PROM/AROM: L hamstring 24 degrees passive SLR R hamstring 28 degrees passive SLR  Did not assess lumbar ROM due to conservative POC  STRENGTH:  Graded on a 0-5 scale Muscle Group Left Right  Hip Flex 3+/5 4-/5  Hip Abd 3+/5 4-/5  Hip Add 2+/5 3+/5  Hip Ext unable to assess due to positioning unable to assess due to positioning   Hip IR/ER    Knee Flex 4-/5 4-/5  Knee Ext 4-/5 4-/5  Ankle DF 3+/5 4-/5  Ankle PF 4-/5 4-/5   Big toe extension: L 2+/5, R 4-/5 Toe flexion: 4-/5 bilaterally Poor abdominal contraction: limited TrA activation for <3 seconds  SENSATION/ SPECIAL: Decreased L foot sensation Tingling of L big toe  Palpation: scar tissue assessment: string at top of incision, picture taken, patient educated on need to inform physician of presence due to small hole.  Scar tissue has small adhesions throughout length.  Muscle tissue length limitations of lumbar parapsinals.   FUNCTIONAL MOBILITY:  STS push through RLE more than LLE   GAIT: No AD. Little to no UE swing with ambulation, short step length with noted tightness of hamstrings and gluteal musculature, poor toe off of LLE  OUTCOME MEASURES: TEST Outcome Interpretation  5 times sit<>stand 13 sec >60 yo, >15 sec indicates increased risk for falls  10 meter walk test      >1.0           m/s <1.0 m/s indicates increased risk for falls; limited community ambulator  MODI 36%                    Access Code: [email or web address]  URL: [email or web address]  Date: 12/29/2018  Prepared by: Precious BardMarina Donnis Phaneuf   Exercises Supine Transversus Abdominis Bracing - Hands on Stomach - 10 reps - 2 sets - 5 hold - 1x daily - 7x weekly Seated Hamstring Stretch - 2 reps - 2 sets - 30 hold - 1x daily - 7x weekly Seated Piriformis Stretch - 2 reps - 2 sets - 30 hold - 1x daily - 7x weekly   Added scar tissue massage:  Objective measurements completed  on examination: See above  findings.              PT Education - 12/29/18 1730    Education Details  goals, POC, HEP    Person(s) Educated  Patient    Methods  Explanation;Demonstration;Tactile cues;Verbal cues;Handout    Comprehension  Verbalized understanding;Returned demonstration;Verbal cues required;Tactile cues required       PT Short Term Goals - 12/29/18 1736      PT SHORT TERM GOAL #1   Title  Pt will be independent with HEP in order to improve strength and decrease back pain in order to improve pain-free function at home and work.     Baseline  10/29: given    Time  4    Period  Weeks    Status  New    Target Date  01/26/19        PT Long Term Goals - 12/29/18 1737      PT LONG TERM GOAL #1   Title  Patient will reduce modified Oswestry score to <20 as to demonstrate minimal disability with ADLs including improved sleeping tolerance, walking/sitting tolerance etc for better mobility with ADLs.    Baseline  10/29: 36%    Time  8    Period  Weeks    Status  New    Target Date  02/23/19      PT LONG TERM GOAL #2   Title  Patient will increase BLE gross strength to 4+/5 as to improve functional strength for independent gait, increased standing tolerance and increased ADL ability.    Baseline  10/29: LLE grossly 3+/5 RLE 4-/5    Time  8    Period  Weeks    Status  New    Target Date  02/23/19      PT LONG TERM GOAL #3   Title  Patient (< 40 years old) will complete five times sit to stand test in < 10 seconds indicating an increased LE strength and improved balance.    Baseline  10/29: 13 seconds    Time  8    Period  Weeks    Status  New    Target Date  02/23/19      PT LONG TERM GOAL #4   Title  Patient will report average VAS of 1/10 to improve tolerance with ADLs and reduced symptoms with activities.    Baseline  10/29: 3/10    Time  8    Period  Weeks    Status  New    Target Date  02/23/19             Plan - 12/29/18 1732     Clinical Impression Statement  Patient is a pleasant 38 year old male who presents s/p PLIF L5-S1 on 11/14/18 for grade I spondylolisthesis L5-S1 with severe spinal stenosis bilateral L5 radiculopathies left greater than right. Patient is currently 6 weeks s/p surgery and still under precautions against extension and lifting weight. He has noticeable muscle tissue limitations of posterior LE musculature as well as piriformis. Patient's gait is functional however is limited by step length and toe off of LLE. Core stability is limited with decreased ability to hold a contraction noted.  Patient will benefit from skilled physical therapy to increase strength, mobility, and decrease pain levels for improved level of function and quality of life.    Personal Factors and Comorbidities  Comorbidity 2    Comorbidities  HTN, GERD, hypercholesteremia, hypotestosteronism, cardiac murmur,    Examination-Activity Limitations  Bed  Mobility;Bathing;Bend;Caring for Others;Carry;Hygiene/Grooming;Stairs;Squat;Reach Overhead;Locomotion Level;Lift;Stand;Toileting;Transfers    Examination-Participation Restrictions  Church;Cleaning;Community Activity;Interpersonal Relationship;Laundry;Volunteer;Shop;Meal Prep;Yard Work    Merchant navy officer  Evolving/Moderate complexity    Clinical Decision Making  Moderate    Rehab Potential  Good    PT Frequency  2x / week    PT Duration  8 weeks    PT Treatment/Interventions  ADLs/Self Care Home Management;Biofeedback;Aquatic Therapy;Canalith Repostioning;Cryotherapy;Electrical Stimulation;Iontophoresis 4mg /ml Dexamethasone;Moist Heat;Traction;Ultrasound;DME Instruction;Gait training;Stair training;Functional mobility training;Therapeutic activities;Therapeutic exercise;Balance training;Neuromuscular re-education;Patient/family education;Manual techniques;Passive range of motion;Dry needling;Energy conservation;Taping;Vestibular;Scar mobilization;Compression bandaging    PT  Next Visit Plan  scar tissue massage, STM to lumbar spine, hamstring lengthening, progressive core    PT Home Exercise Plan  see above    Consulted and Agree with Plan of Care  Patient       Patient will benefit from skilled therapeutic intervention in order to improve the following deficits and impairments:  Pain, Postural dysfunction, Decreased strength, Difficulty walking, Abnormal gait, Decreased activity tolerance, Decreased endurance, Decreased scar mobility, Decreased mobility, Decreased range of motion, Hypomobility, Impaired flexibility, Increased muscle spasms, Impaired sensation, Improper body mechanics  Visit Diagnosis: Muscle weakness (generalized)  Other abnormalities of gait and mobility  Chronic midline low back pain, unspecified whether sciatica present     Problem List Patient Active Problem List   Diagnosis Date Noted  . Spondylolisthesis at L5-S1 level 11/14/2018  . Essential hypertension 03/30/2018  . CN (constipation) 08/15/2014  . Elevated blood sugar 08/15/2014  . GERD (gastroesophageal reflux disease) 08/15/2014  . External hemorrhoid 08/15/2014  . Hypercholesteremia 08/15/2014  . Hypotestosteronism 08/15/2014  . Cardiac murmur 08/15/2014  . Awareness of heartbeats 08/15/2014  . Avitaminosis D 08/15/2014    Janna Arch, PT, DPT   12/29/2018, 5:43 PM  [place] MAIN [facility] SERVICES [address] [place], [place], [address] Phone: [phone]   Fax:  [phone]  Name: Eric Lane MRN: [id number] Date of Birth: August 07, 1980

## 2019-01-03 ENCOUNTER — Encounter: Payer: Commercial Managed Care - PPO | Admitting: Physical Therapy

## 2019-01-11 ENCOUNTER — Ambulatory Visit: Payer: Commercial Managed Care - PPO | Attending: Neurosurgery

## 2019-01-11 ENCOUNTER — Other Ambulatory Visit: Payer: Self-pay

## 2019-01-11 DIAGNOSIS — M6281 Muscle weakness (generalized): Secondary | ICD-10-CM | POA: Insufficient documentation

## 2019-01-11 DIAGNOSIS — R2689 Other abnormalities of gait and mobility: Secondary | ICD-10-CM | POA: Diagnosis present

## 2019-01-11 DIAGNOSIS — G8929 Other chronic pain: Secondary | ICD-10-CM | POA: Diagnosis present

## 2019-01-11 DIAGNOSIS — M545 Low back pain, unspecified: Secondary | ICD-10-CM

## 2019-01-11 NOTE — Therapy (Signed)
[place] [facility] MAIN [facility] SERVICES [address] [place], [place], [address] Phone: [phone]   Fax:  [phone]  Physical Therapy Treatment  Patient Details  Name: Eric Lane MRN: [id number] Date of Birth: 1981-02-20 Referring Provider (PT): Dr. Wynetta Emery   Encounter Date: 01/11/2019  PT End of Session - 01/11/19 1748    Visit Number  2    Number of Visits  16    Date for PT Re-Evaluation  02/23/19    Authorization Type  2/10 eval 12/29/18    PT Start Time  1646    PT Stop Time  1732    PT Time Calculation (min)  46 min    Activity Tolerance  Patient tolerated treatment well    Behavior During Therapy  [facility] for tasks assessed/performed       History reviewed. No pertinent past medical history.  Past Surgical History:  Procedure Laterality Date  . EYE SURGERY    . LASIK Bilateral 09/20/2014    There were no vitals filed for this visit.  Subjective Assessment - 01/11/19 1746    Subjective  Patient had stitch removed by physician last week. Has been compliant with HEP. Has stopped wearing his brace as frequently and is feeling less sore.    Pertinent History  Patient received surgery on 11/14/18 for grade I spondylolisthesis L5-S1 with severe spinal stenosis bilateral L5 radiculopathies left greater than right, he underwent PLIF L5-S1. Has seen this therapist earlier this year prior to this surgery and COVID-19 outbreak. Per patient no extension, no sit ups, is ok to do planks. Patient presents in back brace, is supposed to be in for 2 months s/p surgery.  Tingling numbness in big toe of right side. Returned back to work Monday. More sore in the middle of the night.    Limitations  Standing;Walking;Sitting;Lifting;House hold activities    How long can you sit comfortably?  20-30 minutes    How long can you stand comfortably?  hasn't stood still for long duration    How long can you walk comfortably?  has walked 3 miles    Diagnostic tests   s/p surgery    Patient Stated Goals  improve gait/strength, return to biking, golfing, running    Currently in Pain?  No/denies         Sidelying: STM with implementation of effleurage and pettrisage with "milking" aspects for reduction of swelling and muscle tissue stiffness. X 3 minutes   Scar tissue massage x2 minutes; cross friction.   Clamshells 10x each side, cueing for body mechanics and position of self for optimal muscle recruitment    Supine:  Hamstring lengthening passive stretch on PT shoulder 60 seconds, popliteal angle hamstring stretch 60 seconds  TrA activation with cueing: inhale, exhale, drop single LE ~45 degrees for muscle activation 10x each side  TrA activation with swiss ball pressing between knees and hands for recruitment 10x 3 second   Heelslide with TrA activation 10x each LE  SLR with posterior pelvic tilt and TrA activation, cueing for opposite LE in hooklying for protection of back  Stand Hip flexor standing stretch 30 second each side, cueing for shifting pelvis for neutral alignment to improve lengthening of hip flexors.    Added to HEP   Access Code: [email or web address]  URL: [email or web address]  Date: 01/11/2019  Prepared by: Precious Bard   Exercises Supine Heel Slides - 10 reps - 2 sets - 5 hold - 1x daily - 7x weekly Small Range  Straight Leg Raise - 10 reps - 2 sets - 5 hold - 1x daily - 7x weekly Clamshell - 10 reps - 2 sets - 5 hold - 1x daily - 7x weekly Standing Hip Flexor Stretch - 2 reps - 2 sets - 30 hold - 1x daily - 7x weekly    Pt educated throughout session about proper posture and technique with exercises. Improved exercise technique, movement at target joints, use of target muscles after min to mod verbal, visual, tactile cues.                PT Education - 01/11/19 1747    Education Details  additional HEP, body positioning    Person(s) Educated  Patient    Methods  Explanation;Demonstration;Tactile  cues;Verbal cues    Comprehension  Verbalized understanding;Returned demonstration;Verbal cues required;Tactile cues required       PT Short Term Goals - 12/29/18 1736      PT SHORT TERM GOAL #1   Title  Pt will be independent with HEP in order to improve strength and decrease back pain in order to improve pain-free function at home and work.     Baseline  10/29: given    Time  4    Period  Weeks    Status  New    Target Date  01/26/19        PT Long Term Goals - 12/29/18 1737      PT LONG TERM GOAL #1   Title  Patient will reduce modified Oswestry score to <20 as to demonstrate minimal disability with ADLs including improved sleeping tolerance, walking/sitting tolerance etc for better mobility with ADLs.    Baseline  10/29: 36%    Time  8    Period  Weeks    Status  New    Target Date  02/23/19      PT LONG TERM GOAL #2   Title  Patient will increase BLE gross strength to 4+/5 as to improve functional strength for independent gait, increased standing tolerance and increased ADL ability.    Baseline  10/29: LLE grossly 3+/5 RLE 4-/5    Time  8    Period  Weeks    Status  New    Target Date  02/23/19      PT LONG TERM GOAL #3   Title  Patient (< 71 years old) will complete five times sit to stand test in < 10 seconds indicating an increased LE strength and improved balance.    Baseline  10/29: 13 seconds    Time  8    Period  Weeks    Status  New    Target Date  02/23/19      PT LONG TERM GOAL #4   Title  Patient will report average VAS of 1/10 to improve tolerance with ADLs and reduced symptoms with activities.    Baseline  10/29: 3/10    Time  8    Period  Weeks    Status  New    Target Date  02/23/19            Plan - 01/11/19 1749    Clinical Impression Statement  Patient presents with limited hamstring length bilaterally. He is challenged with core activation of TrA musculature with prolonged holds, fatiguing quickly. Added HEP for LE and core  strengthening/stability given with patient demonstrating understanding. Patient will benefit from skilled physical therapy to increase strength, mobility, and decrease pain levels for improved level of function  and quality of life.    Personal Factors and Comorbidities  Comorbidity 2    Comorbidities  HTN, GERD, hypercholesteremia, hypotestosteronism, cardiac murmur,    Examination-Activity Limitations  Bed Mobility;Bathing;Bend;Caring for Others;Carry;Hygiene/Grooming;Stairs;Squat;Reach Overhead;Locomotion Level;Lift;Stand;Toileting;Transfers    Examination-Participation Restrictions  Church;Cleaning;Community Activity;Interpersonal Relationship;Laundry;Volunteer;Shop;Meal Prep;Yard Work    Conservation officer, historic buildingstability/Clinical Decision Making  Evolving/Moderate complexity    Rehab Potential  Good    PT Frequency  2x / week    PT Duration  8 weeks    PT Treatment/Interventions  ADLs/Self Care Home Management;Biofeedback;Aquatic Therapy;Canalith Repostioning;Cryotherapy;Electrical Stimulation;Iontophoresis 4mg /ml Dexamethasone;Moist Heat;Traction;Ultrasound;DME Instruction;Gait training;Stair training;Functional mobility training;Therapeutic activities;Therapeutic exercise;Balance training;Neuromuscular re-education;Patient/family education;Manual techniques;Passive range of motion;Dry needling;Energy conservation;Taping;Vestibular;Scar mobilization;Compression bandaging    PT Next Visit Plan  scar tissue massage, STM to lumbar spine, hamstring lengthening, progressive core    PT Home Exercise Plan  see above    Consulted and Agree with Plan of Care  Patient       Patient will benefit from skilled therapeutic intervention in order to improve the following deficits and impairments:  Pain, Postural dysfunction, Decreased strength, Difficulty walking, Abnormal gait, Decreased activity tolerance, Decreased endurance, Decreased scar mobility, Decreased mobility, Decreased range of motion, Hypomobility, Impaired flexibility,  Increased muscle spasms, Impaired sensation, Improper body mechanics  Visit Diagnosis: Muscle weakness (generalized)  Other abnormalities of gait and mobility  Chronic midline low back pain, unspecified whether sciatica present     Problem List Patient Active Problem List   Diagnosis Date Noted  . Spondylolisthesis at L5-S1 level 11/14/2018  . Essential hypertension 03/30/2018  . CN (constipation) 08/15/2014  . Elevated blood sugar 08/15/2014  . GERD (gastroesophageal reflux disease) 08/15/2014  . External hemorrhoid 08/15/2014  . Hypercholesteremia 08/15/2014  . Hypotestosteronism 08/15/2014  . Cardiac murmur 08/15/2014  . Awareness of heartbeats 08/15/2014  . Avitaminosis D 08/15/2014   Precious BardMarina Chassity Ludke, PT, DPT   01/11/2019, 5:50 PM  [place] [facility] MAIN [facility] [address] [place], [place], [id number] Phone: [phone]   Fax:  [phone]  Name: Eric Lane MRN: [id number] Date of Birth: Sep 09, 1980

## 2019-01-12 ENCOUNTER — Ambulatory Visit: Payer: Commercial Managed Care - PPO

## 2019-01-12 ENCOUNTER — Other Ambulatory Visit: Payer: Self-pay

## 2019-01-12 DIAGNOSIS — R2689 Other abnormalities of gait and mobility: Secondary | ICD-10-CM

## 2019-01-12 DIAGNOSIS — G8929 Other chronic pain: Secondary | ICD-10-CM

## 2019-01-12 DIAGNOSIS — M6281 Muscle weakness (generalized): Secondary | ICD-10-CM | POA: Diagnosis not present

## 2019-01-12 DIAGNOSIS — M545 Low back pain, unspecified: Secondary | ICD-10-CM

## 2019-01-12 NOTE — Therapy (Signed)
[facility] MAIN [facility] SERVICES [address] [place], [place], [address] Phone: [phone]   Fax:  [phone]  Physical Therapy Treatment  Patient Details  Name: Eric Lane MRN: [id number] Date of Birth: Sep 01, 1980 Referring Provider (PT): Dr. Wynetta Emery   Encounter Date: 01/12/2019  PT End of Session - 01/12/19 1624    Visit Number  3    Number of Visits  16    Date for PT Re-Evaluation  02/23/19    Authorization Type  3/10 eval 12/29/18    PT Start Time  1517    PT Stop Time  1600    PT Time Calculation (min)  43 min    Activity Tolerance  Patient tolerated treatment well    Behavior During Therapy  [facility] for tasks assessed/performed       History reviewed. No pertinent past medical history.  Past Surgical History:  Procedure Laterality Date  . EYE SURGERY    . LASIK Bilateral 09/20/2014    There were no vitals filed for this visit.  Subjective Assessment - 01/12/19 1622    Subjective  Patient presents with slight increase in low back stiffness. Has been doing his HEP.    Pertinent History  Patient received surgery on 11/14/18 for grade I spondylolisthesis L5-S1 with severe spinal stenosis bilateral L5 radiculopathies left greater than right, he underwent PLIF L5-S1. Has seen this therapist earlier this year prior to this surgery and COVID-19 outbreak. Per patient no extension, no sit ups, is ok to do planks. Patient presents in back brace, is supposed to be in for 2 months s/p surgery.  Tingling numbness in big toe of right side. Returned back to work Monday. More sore in the middle of the night.    Limitations  Standing;Walking;Sitting;Lifting;House hold activities    How long can you sit comfortably?  20-30 minutes    How long can you stand comfortably?  hasn't stood still for long duration    How long can you walk comfortably?  has walked 3 miles    Diagnostic tests  s/p surgery    Patient Stated Goals  improve gait/strength, return  to biking, golfing, running    Currently in Pain?  No/denies           Sidelying: STM with implementation of effleurage and pettrisage with "milking" aspects for reduction of swelling and muscle tissue stiffness. X 6 minutes    Scar tissue massage x2 minutes; cross friction.    Clamshells 10x each side, cueing for body mechanics and position of self for optimal muscle recruitment    Hip flexor stretch 60 seconds each LE.    Supine:   Dead Bug : opposite LE and UE raises with cross body coordination and core stabilization 10x each side, challenging with LLE more than RLE.   Hamstring lengthening passive stretch on PT shoulder 60 seconds x2 trials each LE, popliteal angle hamstring stretch 60 seconds x2 trials each LE,  Piriformis stretch cross body 60 seconds in varying angles x3 each LE   RTB hip abduction with core contraction 12x    Sit:  Mini squat modified to sit to stand; cueing for feet apart and proper core contraction 8x  TrA activation pressing swiss ball between knees and retracted straight arms 10x 3 second holds      Pt educated throughout session about proper posture and technique with exercises. Improved exercise technique, movement at target joints, use of target muscles after min to mod verbal, visual,  tactile cues.  Patient presents with limited muscle tissue length of hamstrings and lower back musculature. Patient compliant with new HEP, continues to be challenged with core activation but is improving with breathing techniques, progression towards seated core activation performed. Patient will benefit from skilled physical therapy to increase strength, mobility, and decrease pain levels for improved level of function and quality of life.                   PT Education - 01/12/19 1624    Education Details  exercise technique, prolonged hamstring stretch    Person(s) Educated  Patient    Methods  Explanation;Demonstration;Tactile cues;Verbal  cues    Comprehension  Verbalized understanding;Returned demonstration;Verbal cues required;Tactile cues required;Need further instruction       PT Short Term Goals - 12/29/18 1736      PT SHORT TERM GOAL #1   Title  Pt will be independent with HEP in order to improve strength and decrease back pain in order to improve pain-free function at home and work.     Baseline  10/29: given    Time  4    Period  Weeks    Status  New    Target Date  01/26/19        PT Long Term Goals - 12/29/18 1737      PT LONG TERM GOAL #1   Title  Patient will reduce modified Oswestry score to <20 as to demonstrate minimal disability with ADLs including improved sleeping tolerance, walking/sitting tolerance etc for better mobility with ADLs.    Baseline  10/29: 36%    Time  8    Period  Weeks    Status  New    Target Date  02/23/19      PT LONG TERM GOAL #2   Title  Patient will increase BLE gross strength to 4+/5 as to improve functional strength for independent gait, increased standing tolerance and increased ADL ability.    Baseline  10/29: LLE grossly 3+/5 RLE 4-/5    Time  8    Period  Weeks    Status  New    Target Date  02/23/19      PT LONG TERM GOAL #3   Title  Patient (< 30 years old) will complete five times sit to stand test in < 10 seconds indicating an increased LE strength and improved balance.    Baseline  10/29: 13 seconds    Time  8    Period  Weeks    Status  New    Target Date  02/23/19      PT LONG TERM GOAL #4   Title  Patient will report average VAS of 1/10 to improve tolerance with ADLs and reduced symptoms with activities.    Baseline  10/29: 3/10    Time  8    Period  Weeks    Status  New    Target Date  02/23/19            Plan - 01/12/19 1634    Clinical Impression Statement  Patient presents with limited muscle tissue length of hamstrings and lower back musculature. Patient compliant with new HEP, continues to be challenged with core activation but is  improving with breathing techniques, progression towards seated core activation performed. Patient will benefit from skilled physical therapy to increase strength, mobility, and decrease pain levels for improved level of function and quality of life.    Personal Factors and Comorbidities  Comorbidity 2    Comorbidities  HTN, GERD, hypercholesteremia, hypotestosteronism, cardiac murmur,    Examination-Activity Limitations  Bed Mobility;Bathing;Bend;Caring for Others;Carry;Hygiene/Grooming;Stairs;Squat;Reach Overhead;Locomotion Level;Lift;Stand;Toileting;Transfers    Examination-Participation Restrictions  Church;Cleaning;Community Activity;Interpersonal Relationship;Laundry;Volunteer;Shop;Meal Prep;Yard Work    Conservation officer, historic buildingstability/Clinical Decision Making  Evolving/Moderate complexity    Rehab Potential  Good    PT Frequency  2x / week    PT Duration  8 weeks    PT Treatment/Interventions  ADLs/Self Care Home Management;Biofeedback;Aquatic Therapy;Canalith Repostioning;Cryotherapy;Electrical Stimulation;Iontophoresis 4mg /ml Dexamethasone;Moist Heat;Traction;Ultrasound;DME Instruction;Gait training;Stair training;Functional mobility training;Therapeutic activities;Therapeutic exercise;Balance training;Neuromuscular re-education;Patient/family education;Manual techniques;Passive range of motion;Dry needling;Energy conservation;Taping;Vestibular;Scar mobilization;Compression bandaging    PT Next Visit Plan  scar tissue massage, STM to lumbar spine, hamstring lengthening, progressive core    PT Home Exercise Plan  see above    Consulted and Agree with Plan of Care  Patient       Patient will benefit from skilled therapeutic intervention in order to improve the following deficits and impairments:  Pain, Postural dysfunction, Decreased strength, Difficulty walking, Abnormal gait, Decreased activity tolerance, Decreased endurance, Decreased scar mobility, Decreased mobility, Decreased range of motion, Hypomobility,  Impaired flexibility, Increased muscle spasms, Impaired sensation, Improper body mechanics  Visit Diagnosis: Muscle weakness (generalized)  Other abnormalities of gait and mobility  Chronic midline low back pain, unspecified whether sciatica present     Problem List Patient Active Problem List   Diagnosis Date Noted  . Spondylolisthesis at L5-S1 level 11/14/2018  . Essential hypertension 03/30/2018  . CN (constipation) 08/15/2014  . Elevated blood sugar 08/15/2014  . GERD (gastroesophageal reflux disease) 08/15/2014  . External hemorrhoid 08/15/2014  . Hypercholesteremia 08/15/2014  . Hypotestosteronism 08/15/2014  . Cardiac murmur 08/15/2014  . Awareness of heartbeats 08/15/2014  . Avitaminosis D 08/15/2014   Precious BardMarina Desmond Szabo, PT, DPT    01/12/2019, 4:38 PM  [facility] MAIN [facility] [address] [place], [place], [address] Phone: [phone]   Fax:  [phone]  Name: Eric Lane MRN: [id number] Date of Birth: January 03, 1981

## 2019-01-17 ENCOUNTER — Ambulatory Visit: Payer: Commercial Managed Care - PPO

## 2019-01-17 ENCOUNTER — Other Ambulatory Visit: Payer: Self-pay

## 2019-01-17 DIAGNOSIS — G8929 Other chronic pain: Secondary | ICD-10-CM

## 2019-01-17 DIAGNOSIS — M6281 Muscle weakness (generalized): Secondary | ICD-10-CM

## 2019-01-17 DIAGNOSIS — R2689 Other abnormalities of gait and mobility: Secondary | ICD-10-CM

## 2019-01-17 NOTE — Therapy (Signed)
[facility] MAIN [facility] SERVICES [address] [place], [place], [address] Phone: [phone]   Fax:  [phone]  Physical Therapy Treatment  Patient Details  Name: Eric Lane MRN: [id number] Date of Birth: 03-25-1980 Referring Provider (PT): Dr. Wynetta Emery   Encounter Date: 01/17/2019  PT End of Session - 01/17/19 1741    Visit Number  4    Number of Visits  16    Date for PT Re-Evaluation  02/23/19    Authorization Type  4/10 eval 12/29/18    PT Start Time  1645    PT Stop Time  1731    PT Time Calculation (min)  46 min    Activity Tolerance  Patient tolerated treatment well    Behavior During Therapy  [facility] for tasks assessed/performed       History reviewed. No pertinent past medical history.  Past Surgical History:  Procedure Laterality Date  . EYE SURGERY    . LASIK Bilateral 09/20/2014    There were no vitals filed for this visit.  Subjective Assessment - 01/17/19 1738    Subjective  Patient reports he has had excessive low back pain with radiating symptoms down LLE. He reports he did his exercises that he was taught by this therapist and the pain reduced, he took a muscle relaxer to sleep and woke up with decreased pain but still feeling stiff. Reports it may be from mowing his lawn and picking up grass clippings.    Pertinent History  Patient received surgery on 11/14/18 for grade I spondylolisthesis L5-S1 with severe spinal stenosis bilateral L5 radiculopathies left greater than right, he underwent PLIF L5-S1. Has seen this therapist earlier this year prior to this surgery and COVID-19 outbreak. Per patient no extension, no sit ups, is ok to do planks. Patient presents in back brace, is supposed to be in for 2 months s/p surgery.  Tingling numbness in big toe of right side. Returned back to work Monday. More sore in the middle of the night.    Limitations  Standing;Walking;Sitting;Lifting;House hold activities    How long can you sit  comfortably?  20-30 minutes    How long can you stand comfortably?  hasn't stood still for long duration    How long can you walk comfortably?  has walked 3 miles    Diagnostic tests  s/p surgery    Patient Stated Goals  improve gait/strength, return to biking, golfing, running    Currently in Pain?  Yes    Pain Score  2     Pain Location  Back    Pain Orientation  Lower    Pain Descriptors / Indicators  Aching    Pain Type  Chronic pain    Pain Onset  More than a month ago    Pain Frequency  Intermittent        Sidelying: STM with implementation of effleurage and pettrisage with "milking" aspects for reduction of swelling and muscle tissue stiffness. X 8 minutes    Scar tissue massage x2 minutes; cross friction.    Hip flexor stretch 60 seconds each LE. x2 trials    Supine: heat under lumbar spine musculature  Swiss ball TrA activation with posterior pelvic tilt pushing into ball with knees and hands 10x 3 second holds  Dead Bug : opposite LE and UE raises with cross body coordination and core stabilization pressing into swiss ball 12x each side, challenging with LLE more than RLE.    Hamstring lengthening  passive stretch on PT shoulder 60 seconds x3 trials each LE, popliteal angle hamstring stretch 60 seconds x2 trials each LE,  Piriformis stretch cross body 60 seconds in varying angles x5 each LE   Standing:  Education on proper lifting technique, squat form technique and modified lunge technique for core activation and spinal alignment for reduction of twisting/bending     Pt educated throughout session about proper posture and technique with exercises. Improved exercise technique, movement at target joints, use of target muscles after min to mod verbal, visual, tactile cues.                        PT Education - 01/17/19 1740    Education Details  exercise technique, prolonged hamstring stretch, manual    Person(s) Educated  Patient    Methods   Explanation;Demonstration;Tactile cues;Verbal cues    Comprehension  Verbalized understanding;Returned demonstration;Verbal cues required;Tactile cues required       PT Short Term Goals - 12/29/18 1736      PT SHORT TERM GOAL #1   Title  Pt will be independent with HEP in order to improve strength and decrease back pain in order to improve pain-free function at home and work.     Baseline  10/29: given    Time  4    Period  Weeks    Status  New    Target Date  01/26/19        PT Long Term Goals - 12/29/18 1737      PT LONG TERM GOAL #1   Title  Patient will reduce modified Oswestry score to <20 as to demonstrate minimal disability with ADLs including improved sleeping tolerance, walking/sitting tolerance etc for better mobility with ADLs.    Baseline  10/29: 36%    Time  8    Period  Weeks    Status  New    Target Date  02/23/19      PT LONG TERM GOAL #2   Title  Patient will increase BLE gross strength to 4+/5 as to improve functional strength for independent gait, increased standing tolerance and increased ADL ability.    Baseline  10/29: LLE grossly 3+/5 RLE 4-/5    Time  8    Period  Weeks    Status  New    Target Date  02/23/19      PT LONG TERM GOAL #3   Title  Patient (< 56 years old) will complete five times sit to stand test in < 10 seconds indicating an increased LE strength and improved balance.    Baseline  10/29: 13 seconds    Time  8    Period  Weeks    Status  New    Target Date  02/23/19      PT LONG TERM GOAL #4   Title  Patient will report average VAS of 1/10 to improve tolerance with ADLs and reduced symptoms with activities.    Baseline  10/29: 3/10    Time  8    Period  Weeks    Status  New    Target Date  02/23/19            Plan - 01/17/19 1746    Clinical Impression Statement  Patient presents with increased pain and muscle tissue tightness secondary to overdoing it over the weekend. Hamstring length severely limited initially but  improved with repeated prolonged holds. Patient will benefit from skilled physical therapy to  increase strength, mobility, and decrease pain levels for improved level of function and quality of life.    Personal Factors and Comorbidities  Comorbidity 2    Comorbidities  HTN, GERD, hypercholesteremia, hypotestosteronism, cardiac murmur,    Examination-Activity Limitations  Bed Mobility;Bathing;Bend;Caring for Others;Carry;Hygiene/Grooming;Stairs;Squat;Reach Overhead;Locomotion Level;Lift;Stand;Toileting;Transfers    Examination-Participation Restrictions  Church;Cleaning;Community Activity;Interpersonal Relationship;Laundry;Volunteer;Shop;Meal Prep;Yard Work    Conservation officer, historic buildingstability/Clinical Decision Making  Evolving/Moderate complexity    Rehab Potential  Good    PT Frequency  2x / week    PT Duration  8 weeks    PT Treatment/Interventions  ADLs/Self Care Home Management;Biofeedback;Aquatic Therapy;Canalith Repostioning;Cryotherapy;Electrical Stimulation;Iontophoresis 4mg /ml Dexamethasone;Moist Heat;Traction;Ultrasound;DME Instruction;Gait training;Stair training;Functional mobility training;Therapeutic activities;Therapeutic exercise;Balance training;Neuromuscular re-education;Patient/family education;Manual techniques;Passive range of motion;Dry needling;Energy conservation;Taping;Vestibular;Scar mobilization;Compression bandaging    PT Next Visit Plan  scar tissue massage, STM to lumbar spine, hamstring lengthening, progressive core    PT Home Exercise Plan  see above    Consulted and Agree with Plan of Care  Patient       Patient will benefit from skilled therapeutic intervention in order to improve the following deficits and impairments:  Pain, Postural dysfunction, Decreased strength, Difficulty walking, Abnormal gait, Decreased activity tolerance, Decreased endurance, Decreased scar mobility, Decreased mobility, Decreased range of motion, Hypomobility, Impaired flexibility, Increased muscle spasms,  Impaired sensation, Improper body mechanics  Visit Diagnosis: Muscle weakness (generalized)  Other abnormalities of gait and mobility  Chronic midline low back pain, unspecified whether sciatica present     Problem List Patient Active Problem List   Diagnosis Date Noted  . Spondylolisthesis at L5-S1 level 11/14/2018  . Essential hypertension 03/30/2018  . CN (constipation) 08/15/2014  . Elevated blood sugar 08/15/2014  . GERD (gastroesophageal reflux disease) 08/15/2014  . External hemorrhoid 08/15/2014  . Hypercholesteremia 08/15/2014  . Hypotestosteronism 08/15/2014  . Cardiac murmur 08/15/2014  . Awareness of heartbeats 08/15/2014  . Avitaminosis D 08/15/2014  Precious BardMarina Bartley Vuolo, PT, DPT    01/17/2019, 5:47 PM  [place] [facility] [address] [place], [place], [address] Phone: [phone]   Fax:  [phone]  Name: Eric Lane MRN: [id number] Date of Birth: 1980-07-11

## 2019-01-18 ENCOUNTER — Ambulatory Visit: Payer: Commercial Managed Care - PPO

## 2019-01-18 ENCOUNTER — Other Ambulatory Visit: Payer: Self-pay

## 2019-01-18 DIAGNOSIS — M545 Low back pain, unspecified: Secondary | ICD-10-CM

## 2019-01-18 DIAGNOSIS — R2689 Other abnormalities of gait and mobility: Secondary | ICD-10-CM

## 2019-01-18 DIAGNOSIS — M6281 Muscle weakness (generalized): Secondary | ICD-10-CM

## 2019-01-18 DIAGNOSIS — G8929 Other chronic pain: Secondary | ICD-10-CM

## 2019-01-19 NOTE — Therapy (Signed)
[place] MAIN [facility] SERVICES [address] [place], [place], [address] Phone: [phone]   Fax:  [phone]  Physical Therapy Treatment  Patient Details  Name: Eric Lane MRN: [id number] Date of Birth: [birth year] Referring Provider (PT): Dr. Saintclair Halsted   Encounter Date: 01/18/2019  PT End of Session - 01/19/19 1241    Visit Number  5    Number of Visits  16    Date for PT Re-Evaluation  02/23/19    Authorization Type  5/10 eval 12/29/18    PT Start Time  1645    PT Stop Time  1732    PT Time Calculation (min)  47 min    Activity Tolerance  Patient tolerated treatment well    Behavior During Therapy  [facility] for tasks assessed/performed       History reviewed. No pertinent past medical history.  Past Surgical History:  Procedure Laterality Date  . EYE SURGERY    . LASIK Bilateral 09/20/2014    There were no vitals filed for this visit.  Subjective Assessment - 01/19/19 1223    Subjective  Patient reports feeling stiff but not as painful. Has been doing his exercises this morning. No falls or LOB since last session.    Pertinent History  Patient received surgery on 11/14/18 for grade I spondylolisthesis L5-S1 with severe spinal stenosis bilateral L5 radiculopathies left greater than right, he underwent PLIF L5-S1. Has seen this therapist earlier this year prior to this surgery and COVID-19 outbreak. Per patient no extension, no sit ups, is ok to do planks. Patient presents in back brace, is supposed to be in for 2 months s/p surgery.  Tingling numbness in big toe of right side. Returned back to work Monday. More sore in the middle of the night.    Limitations  Standing;Walking;Sitting;Lifting;House hold activities    How long can you sit comfortably?  20-30 minutes    How long can you stand comfortably?  hasn't stood still for long duration    How long can you walk comfortably?  has walked 3 miles    Diagnostic tests  s/p surgery    Patient  Stated Goals  improve gait/strength, return to biking, golfing, running    Currently in Pain?  No/denies           Patient educated on positioning into prone without rolling/twisting. Transitioned via hands and knees/quadruped walking out into prone position and reverse to get out of prone.  Prone: Hip flexor/iliopsoas lengthening stretch 30 seconds x 4 trials with varying arc of motion for optimal lengthening with towel under distal aspect of knee. Each LE.   STM with implementation of effleurage and pettrisage with "milking" aspects for reduction of swelling and muscle tissue stiffness. X52minutes   Roller to piriformis 4 minutes each LE  Sidelying: Clamshells 12x each LE, tactile cueing for neutral body mechanics  Supine: heat under lumbar spine musculature  Hamstring lengthening passive stretch on PT shoulder 60 secondsx3 trials each LE, popliteal angle hamstring stretch 60 secondsx2 trials each LE, Piriformis stretch cross body 60 seconds in varying angles x5 each LE  Posterior pelvic tilts 10x 3 second holds  Posterior pelvic tilt with adduction squeeze 10x 3 second holds  Seated: Education on proper sit to stand technique x 10  Swiss ball TrA activation with posterior pelvic tilt pushing into ball with knees and hands 10x 3 second holds  Education provided on pillow placement for optimal sleeping position due to increased soreness  after sleeping. Education on body pillow and placement of body pillow with flexion of the knees in sidelying vs quarter turn vs supine.     Pt educated throughout session about proper posture and technique with exercises. Improved exercise technique, movement at target joints, use of target muscles after min to mod verbal, visual, tactile cues.                  PT Education - 01/19/19 1224    Education Details  exercise technique, core contraction/stability, prone stretching    Person(s) Educated  Patient    Methods   Explanation;Demonstration;Verbal cues;Tactile cues    Comprehension  Verbalized understanding;Returned demonstration;Verbal cues required;Tactile cues required       PT Short Term Goals - 12/29/18 1736      PT SHORT TERM GOAL #1   Title  Pt will be independent with HEP in order to improve strength and decrease back pain in order to improve pain-free function at home and work.     Baseline  10/29: given    Time  4    Period  Weeks    Status  New    Target Date  01/26/19        PT Long Term Goals - 12/29/18 1737      PT LONG TERM GOAL #1   Title  Patient will reduce modified Oswestry score to <20 as to demonstrate minimal disability with ADLs including improved sleeping tolerance, walking/sitting tolerance etc for better mobility with ADLs.    Baseline  10/29: 36%    Time  8    Period  Weeks    Status  New    Target Date  02/23/19      PT LONG TERM GOAL #2   Title  Patient will increase BLE gross strength to 4+/5 as to improve functional strength for independent gait, increased standing tolerance and increased ADL ability.    Baseline  10/29: LLE grossly 3+/5 RLE 4-/5    Time  8    Period  Weeks    Status  New    Target Date  02/23/19      PT LONG TERM GOAL #3   Title  Patient (< 18 years old) will complete five times sit to stand test in < 10 seconds indicating an increased LE strength and improved balance.    Baseline  10/29: 13 seconds    Time  8    Period  Weeks    Status  New    Target Date  02/23/19      PT LONG TERM GOAL #4   Title  Patient will report average VAS of 1/10 to improve tolerance with ADLs and reduced symptoms with activities.    Baseline  10/29: 3/10    Time  8    Period  Weeks    Status  New    Target Date  02/23/19            Plan - 01/19/19 1243    Clinical Impression Statement  Patient initially presents with limited muscle tissue length of hip flexors and piriformis musculature. Core activation progressions continue to be challenging  but nonpainful allowing for improved mobility and transfers. Patient will benefit from skilled physical therapy to increase strength, mobility, and decrease pain levels for improved level of function and quality of life    Personal Factors and Comorbidities  Comorbidity 2    Comorbidities  HTN, GERD, hypercholesteremia, hypotestosteronism, cardiac murmur,    Examination-Activity  Limitations  Bed Mobility;Bathing;Bend;Caring for Others;Carry;Hygiene/Grooming;Stairs;Squat;Reach Overhead;Locomotion Level;Lift;Stand;Toileting;Transfers    Examination-Participation Restrictions  Church;Cleaning;Community Activity;Interpersonal Relationship;Laundry;Volunteer;Shop;Meal Prep;Yard Work    Conservation officer, historic buildingstability/Clinical Decision Making  Evolving/Moderate complexity    Rehab Potential  Good    PT Frequency  2x / week    PT Duration  8 weeks    PT Treatment/Interventions  ADLs/Self Care Home Management;Biofeedback;Aquatic Therapy;Canalith Repostioning;Cryotherapy;Electrical Stimulation;Iontophoresis 4mg /ml Dexamethasone;Moist Heat;Traction;Ultrasound;DME Instruction;Gait training;Stair training;Functional mobility training;Therapeutic activities;Therapeutic exercise;Balance training;Neuromuscular re-education;Patient/family education;Manual techniques;Passive range of motion;Dry needling;Energy conservation;Taping;Vestibular;Scar mobilization;Compression bandaging    PT Next Visit Plan  scar tissue massage, STM to lumbar spine, hamstring lengthening, progressive core    PT Home Exercise Plan  see above    Consulted and Agree with Plan of Care  Patient       Patient will benefit from skilled therapeutic intervention in order to improve the following deficits and impairments:  Pain, Postural dysfunction, Decreased strength, Difficulty walking, Abnormal gait, Decreased activity tolerance, Decreased endurance, Decreased scar mobility, Decreased mobility, Decreased range of motion, Hypomobility, Impaired flexibility, Increased  muscle spasms, Impaired sensation, Improper body mechanics  Visit Diagnosis: Muscle weakness (generalized)  Other abnormalities of gait and mobility  Chronic midline low back pain, unspecified whether sciatica present     Problem List Patient Active Problem List   Diagnosis Date Noted  . Spondylolisthesis at L5-S1 level 11/14/2018  . Essential hypertension 03/30/2018  . CN (constipation) 08/15/2014  . Elevated blood sugar 08/15/2014  . GERD (gastroesophageal reflux disease) 08/15/2014  . External hemorrhoid 08/15/2014  . Hypercholesteremia 08/15/2014  . Hypotestosteronism 08/15/2014  . Cardiac murmur 08/15/2014  . Awareness of heartbeats 08/15/2014  . Avitaminosis D 08/15/2014   Precious BardMarina Kashawn Manzano, PT, DPT   01/19/2019, 12:45 PM  [place] [facility] [address] [place], [place], [id number] Phone: [phone]   Fax:  [phone]  Name: Eric Lane MRN: [id number] Date of Birth: [birth year]

## 2019-01-23 ENCOUNTER — Ambulatory Visit: Payer: Commercial Managed Care - PPO

## 2019-01-23 ENCOUNTER — Other Ambulatory Visit: Payer: Self-pay

## 2019-01-23 DIAGNOSIS — M6281 Muscle weakness (generalized): Secondary | ICD-10-CM

## 2019-01-23 DIAGNOSIS — G8929 Other chronic pain: Secondary | ICD-10-CM

## 2019-01-23 DIAGNOSIS — M545 Low back pain, unspecified: Secondary | ICD-10-CM

## 2019-01-23 DIAGNOSIS — R2689 Other abnormalities of gait and mobility: Secondary | ICD-10-CM

## 2019-01-23 NOTE — Therapy (Signed)
[place] [facility] MAIN [facility] SERVICES [address] [place], [place], [address] Phone: [phone]   Fax:  [phone]  Physical Therapy Treatment  Patient Details  Name: Eric Lane MRN: [id number] Date of Birth: 11/25/1980 Referring Provider (PT): Dr. Wynetta Emery   Encounter Date: 01/23/2019  PT End of Session - 01/24/19 1424    Visit Number  6    Number of Visits  16    Date for PT Re-Evaluation  02/23/19    Authorization Type  6/10 eval 12/29/18    PT Start Time  1600    PT Stop Time  1644    PT Time Calculation (min)  44 min    Activity Tolerance  Patient tolerated treatment well    Behavior During Therapy  [facility] for tasks assessed/performed       History reviewed. No pertinent past medical history.  Past Surgical History:  Procedure Laterality Date  . EYE SURGERY    . LASIK Bilateral 09/20/2014    There were no vitals filed for this visit.  Subjective Assessment - 01/24/19 1422    Subjective  Patient reports compliance with HEP, has been feeling like he is "vaulting" over his leg when he is walking.    Pertinent History  Patient received surgery on 11/14/18 for grade I spondylolisthesis L5-S1 with severe spinal stenosis bilateral L5 radiculopathies left greater than right, he underwent PLIF L5-S1. Has seen this therapist earlier this year prior to this surgery and COVID-19 outbreak. Per patient no extension, no sit ups, is ok to do planks. Patient presents in back brace, is supposed to be in for 2 months s/p surgery.  Tingling numbness in big toe of right side. Returned back to work Monday. More sore in the middle of the night.    Limitations  Standing;Walking;Sitting;Lifting;House hold activities    How long can you sit comfortably?  20-30 minutes    How long can you stand comfortably?  hasn't stood still for long duration    How long can you walk comfortably?  has walked 3 miles    Diagnostic tests  s/p surgery    Patient Stated Goals   improve gait/strength, return to biking, golfing, running    Currently in Pain?  No/denies       Prone: Hip flexor/iliopsoas lengthening stretch 30 seconds x 4 trials with varying arc of motion for optimal lengthening with towel under distal aspect of knee. Each LE.    STM with implementation of effleurage and pettrisage with "milking" aspects for reduction of swelling and muscle tissue stiffness. X 8 minutes    Supine:    Hamstring lengthening passive stretch on PT shoulder 60 seconds x3 trials each LE, popliteal angle hamstring stretch 60 seconds x2 trials each LE,  Piriformis stretch cross body 60 seconds in varying angles x5 each LE   Posterior pelvic tilts 10x 3 second holds   SLR 10x each LE to PT hand  RTB hip abduction 15x  RTB marching with TrA contraction 15x  Seated: Seated on swiss ball: trA contractions: -UE raises 5x each side -heel raises 5x each side -single leg marches 10x, cueing for exhale prior to leg lift for stabilization  Measurement of LE's due to patient c/o of vaulting with ambulation; noted 0.5 inch discrepancy with LLE longer, hike of R iliac crest mild.  Use of heel lift for discomfort reduction, improved gait mechanics with patient reporting decreased discomfort.    Education provided on pillow placement for optimal sleeping  position due to increased soreness after sleeping. Education on body pillow and placement of body pillow with flexion of the knees in sidelying vs quarter turn vs supine.     Pt educated throughout session about proper posture and technique with exercises. Improved exercise technique, movement at target joints, use of target muscles after min to mod verbal, visual, tactile cues.                         PT Education - 01/24/19 1422    Education Details  exercise technique, leg length, core stability    Person(s) Educated  Patient    Methods  Explanation;Demonstration;Tactile cues;Verbal cues     Comprehension  Verbalized understanding;Returned demonstration;Verbal cues required;Tactile cues required       PT Short Term Goals - 12/29/18 1736      PT SHORT TERM GOAL #1   Title  Pt will be independent with HEP in order to improve strength and decrease back pain in order to improve pain-free function at home and work.     Baseline  10/29: given    Time  4    Period  Weeks    Status  New    Target Date  01/26/19        PT Long Term Goals - 12/29/18 1737      PT LONG TERM GOAL #1   Title  Patient will reduce modified Oswestry score to <20 as to demonstrate minimal disability with ADLs including improved sleeping tolerance, walking/sitting tolerance etc for better mobility with ADLs.    Baseline  10/29: 36%    Time  8    Period  Weeks    Status  New    Target Date  02/23/19      PT LONG TERM GOAL #2   Title  Patient will increase BLE gross strength to 4+/5 as to improve functional strength for independent gait, increased standing tolerance and increased ADL ability.    Baseline  10/29: LLE grossly 3+/5 RLE 4-/5    Time  8    Period  Weeks    Status  New    Target Date  02/23/19      PT LONG TERM GOAL #3   Title  Patient (< 37 years old) will complete five times sit to stand test in < 10 seconds indicating an increased LE strength and improved balance.    Baseline  10/29: 13 seconds    Time  8    Period  Weeks    Status  New    Target Date  02/23/19      PT LONG TERM GOAL #4   Title  Patient will report average VAS of 1/10 to improve tolerance with ADLs and reduced symptoms with activities.    Baseline  10/29: 3/10    Time  8    Period  Weeks    Status  New    Target Date  02/23/19            Plan - 01/24/19 1424    Clinical Impression Statement  Patient presents with excellent motivation to physical therapy session. Core progression continues to challenge patient with increased demand on stability in more difficult positions. Patient has noted leg length  discrepancy of 0.5 inches resulting in vaulting pattern of ambulation with slight hip hike noted. Patient reports reduction of pain with small heel lift added with education on slow reduction of lift until legs return to functional  length. Patient will benefit from skilled physical therapy to increase strength, mobility, and decrease pain levels for improved level of function and quality of life    Personal Factors and Comorbidities  Comorbidity 2    Comorbidities  HTN, GERD, hypercholesteremia, hypotestosteronism, cardiac murmur,    Examination-Activity Limitations  Bed Mobility;Bathing;Bend;Caring for Others;Carry;Hygiene/Grooming;Stairs;Squat;Reach Overhead;Locomotion Level;Lift;Stand;Toileting;Transfers    Examination-Participation Restrictions  Church;Cleaning;Community Activity;Interpersonal Relationship;Laundry;Volunteer;Shop;Meal Prep;Yard Work    Merchant navy officer  Evolving/Moderate complexity    Rehab Potential  Good    PT Frequency  2x / week    PT Duration  8 weeks    PT Treatment/Interventions  ADLs/Self Care Home Management;Biofeedback;Aquatic Therapy;Canalith Repostioning;Cryotherapy;Electrical Stimulation;Iontophoresis 4mg /ml Dexamethasone;Moist Heat;Traction;Ultrasound;DME Instruction;Gait training;Stair training;Functional mobility training;Therapeutic activities;Therapeutic exercise;Balance training;Neuromuscular re-education;Patient/family education;Manual techniques;Passive range of motion;Dry needling;Energy conservation;Taping;Vestibular;Scar mobilization;Compression bandaging    PT Next Visit Plan  scar tissue massage, STM to lumbar spine, hamstring lengthening, progressive core    PT Home Exercise Plan  see above    Consulted and Agree with Plan of Care  Patient       Patient will benefit from skilled therapeutic intervention in order to improve the following deficits and impairments:  Pain, Postural dysfunction, Decreased strength, Difficulty walking,  Abnormal gait, Decreased activity tolerance, Decreased endurance, Decreased scar mobility, Decreased mobility, Decreased range of motion, Hypomobility, Impaired flexibility, Increased muscle spasms, Impaired sensation, Improper body mechanics  Visit Diagnosis: Muscle weakness (generalized)  Other abnormalities of gait and mobility  Chronic midline low back pain, unspecified whether sciatica present     Problem List Patient Active Problem List   Diagnosis Date Noted  . Spondylolisthesis at L5-S1 level 11/14/2018  . Essential hypertension 03/30/2018  . CN (constipation) 08/15/2014  . Elevated blood sugar 08/15/2014  . GERD (gastroesophageal reflux disease) 08/15/2014  . External hemorrhoid 08/15/2014  . Hypercholesteremia 08/15/2014  . Hypotestosteronism 08/15/2014  . Cardiac murmur 08/15/2014  . Awareness of heartbeats 08/15/2014  . Avitaminosis D 08/15/2014   Janna Arch, PT, DPT    01/24/2019, 2:25 PM  [place] MAIN [facility] SERVICES [address] [place], [place], [address] Phone: [phone]   Fax:  [phone]  Name: Eric Lane MRN: [id number] Date of Birth: [birth year]

## 2019-01-25 ENCOUNTER — Other Ambulatory Visit: Payer: Self-pay

## 2019-01-25 ENCOUNTER — Ambulatory Visit: Payer: Commercial Managed Care - PPO

## 2019-01-25 DIAGNOSIS — R2689 Other abnormalities of gait and mobility: Secondary | ICD-10-CM

## 2019-01-25 DIAGNOSIS — M6281 Muscle weakness (generalized): Secondary | ICD-10-CM

## 2019-01-25 DIAGNOSIS — G8929 Other chronic pain: Secondary | ICD-10-CM

## 2019-01-25 NOTE — Therapy (Signed)
[place] [facility] [address] [place], [place], [address] Phone: [phone]   Fax:  [phone]  Physical Therapy Treatment  Patient Details  Name: Eric Lane MRN: [id number] Date of Birth: [birth year] Referring Provider (PT): Dr. Wynetta Emeryram   Encounter Date: 01/25/2019  PT End of Session - 01/25/19 1743    Visit Number  7    Number of Visits  16    Date for PT Re-Evaluation  02/23/19    Authorization Type  7/10 eval 12/29/18    PT Start Time  1645    PT Stop Time  1731    PT Time Calculation (min)  46 min    Activity Tolerance  Patient tolerated treatment well    Behavior During Therapy  [facility]       History reviewed. No pertinent past medical history.  Past Surgical History:  Procedure Laterality Date  . EYE SURGERY    . LASIK Bilateral 09/20/2014    There were no vitals filed for this visit.  Subjective Assessment - 01/25/19 1743    Subjective  Patient reports some stiffness in LE of lift, has been compliant with HEP. No falls or LOB since last session.    Pertinent History  Patient received surgery on 11/14/18 for grade I spondylolisthesis L5-S1 with severe spinal stenosis bilateral L5 radiculopathies left greater than right, he underwent PLIF L5-S1. Has seen this therapist earlier this year prior to this surgery and COVID-19 outbreak. Per patient no extension, no sit ups, is ok to do planks. Patient presents in back brace, is supposed to be in for 2 months s/p surgery.  Tingling numbness in big toe of right side. Returned back to work Monday. More sore in the middle of the night.    Limitations  Standing;Walking;Sitting;Lifting;House hold activities    How long can you sit comfortably?  20-30 minutes    How long can you stand comfortably?  hasn't stood still for long duration    How long can you walk comfortably?  has walked 3 miles    Diagnostic tests  s/p surgery    Patient Stated Goals   improve gait/strength, return to biking, golfing, running    Currently in Pain?  No/denies              Prone:   STM with implementation of effleurage and pettrisage with "milking" aspects for reduction of swelling and muscle tissue stiffness.noted stiffness of L lateral musculature with improved lengthening with repetitive focus on quadratus lumborum X796minutes  Roller to L piriformis: reduction of symptoms reported x 2 minutes  Quadruped: Child pose to quadruped stretch transition 5x with focus on muscle tissue lengthening  Quadruped with single arm raise clapping PT hand with opposite arm, 10x each side, cueing for exhale for core stability  Supine:  GTB: hip abduction with posterior pelvic tilt 12x (added to Hep)  GTB: marching with TrA contraction, cueing for exhale prior to lifting LE. (added to HEP)  Hamstring lengthening passive stretch on PT shoulder 60 secondsx3trials each LE, popliteal angle hamstring stretch 60 secondsx2 trials each LE, Piriformis stretch cross body 60 seconds in varying angles x5each LE  seated: GTB eccentric press down marches 12x each LE GTB hamstring curl 12x each LE  TRX: Modified lunge with focus on body mechanics and core stability 10x Modified posterior lunge 10x each LE  Heel lift removed from shoe  Pt educated throughout session about proper posture and technique with exercises.  Improved exercise technique, movement at target joints, use of target muscles after min to mod verbal, visual, tactile cues                 PT Education - 01/25/19 1743    Education Details  exercise technique, body mechanics    Person(s) Educated  Patient    Methods  Explanation;Demonstration;Tactile cues;Verbal cues    Comprehension  Verbalized understanding;Returned demonstration;Verbal cues required;Tactile cues required       PT Short Term Goals - 12/29/18 1736      PT SHORT TERM GOAL #1   Title  Pt will be independent  with HEP in order to improve strength and decrease back pain in order to improve pain-free function at home and work.     Baseline  10/29: given    Time  4    Period  Weeks    Status  New    Target Date  01/26/19        PT Long Term Goals - 12/29/18 1737      PT LONG TERM GOAL #1   Title  Patient will reduce modified Oswestry score to <20 as to demonstrate minimal disability with ADLs including improved sleeping tolerance, walking/sitting tolerance etc for better mobility with ADLs.    Baseline  10/29: 36%    Time  8    Period  Weeks    Status  New    Target Date  02/23/19      PT LONG TERM GOAL #2   Title  Patient will increase BLE gross strength to 4+/5 as to improve functional strength for independent gait, increased standing tolerance and increased ADL ability.    Baseline  10/29: LLE grossly 3+/5 RLE 4-/5    Time  8    Period  Weeks    Status  New    Target Date  02/23/19      PT LONG TERM GOAL #3   Title  Patient (< 19 years old) will complete five times sit to stand test in < 10 seconds indicating an increased LE strength and improved balance.    Baseline  10/29: 13 seconds    Time  8    Period  Weeks    Status  New    Target Date  02/23/19      PT LONG TERM GOAL #4   Title  Patient will report average VAS of 1/10 to improve tolerance with ADLs and reduced symptoms with activities.    Baseline  10/29: 3/10    Time  8    Period  Weeks    Status  New    Target Date  02/23/19            Plan - 01/25/19 1752    Clinical Impression Statement  Patient presents with excellent motivation, continued progression of core and LE stability performed with decreased episodes of compensatory movements. Increased hamstring length noted bilaterally however L continues to be more limited than R at this time. Progression of HEP performed with patient demonstrating understanding. Patient will benefit from skilled physical therapy to increase strength, mobility, and decrease pain  levels for improved level of function and quality of life    Personal Factors and Comorbidities  Comorbidity 2    Comorbidities  HTN, GERD, hypercholesteremia, hypotestosteronism, cardiac murmur,    Examination-Activity Limitations  Bed Mobility;Bathing;Bend;Caring for Others;Carry;Hygiene/Grooming;Stairs;Squat;Reach Overhead;Locomotion Level;Lift;Stand;Toileting;Transfers    Examination-Participation Restrictions  Church;Cleaning;Community Activity;Interpersonal Relationship;Laundry;Volunteer;Shop;Meal Prep;Yard Work    Conservation officer, historic buildings  Evolving/Moderate complexity    Rehab Potential  Good    PT Frequency  2x / week    PT Duration  8 weeks    PT Treatment/Interventions  ADLs/Self Care Home Management;Biofeedback;Aquatic Therapy;Canalith Repostioning;Cryotherapy;Electrical Stimulation;Iontophoresis 4mg /ml Dexamethasone;Moist Heat;Traction;Ultrasound;DME Instruction;Gait training;Stair training;Functional mobility training;Therapeutic activities;Therapeutic exercise;Balance training;Neuromuscular re-education;Patient/family education;Manual techniques;Passive range of motion;Dry needling;Energy conservation;Taping;Vestibular;Scar mobilization;Compression bandaging    PT Next Visit Plan  scar tissue massage, STM to lumbar spine, hamstring lengthening, progressive core    PT Home Exercise Plan  see above    Consulted and Agree with Plan of Care  Patient       Patient will benefit from skilled therapeutic intervention in order to improve the following deficits and impairments:  Pain, Postural dysfunction, Decreased strength, Difficulty walking, Abnormal gait, Decreased activity tolerance, Decreased endurance, Decreased scar mobility, Decreased mobility, Decreased range of motion, Hypomobility, Impaired flexibility, Increased muscle spasms, Impaired sensation, Improper body mechanics  Visit Diagnosis: Muscle weakness (generalized)  Other abnormalities of gait and  mobility  Chronic midline low back pain, unspecified whether sciatica present     Problem List Patient Active Problem List   Diagnosis Date Noted  . Spondylolisthesis at L5-S1 level 11/14/2018  . Essential hypertension 03/30/2018  . CN (constipation) 08/15/2014  . Elevated blood sugar 08/15/2014  . GERD (gastroesophageal reflux disease) 08/15/2014  . External hemorrhoid 08/15/2014  . Hypercholesteremia 08/15/2014  . Hypotestosteronism 08/15/2014  . Cardiac murmur 08/15/2014  . Awareness of heartbeats 08/15/2014  . Avitaminosis D 08/15/2014   Janna Arch, PT, DPT   01/25/2019, 5:53 PM  [place] MAIN [facility] SERVICES [address] [place], [place], [address] Phone: [phone]   Fax:  [phone]  Name: Eric Lane MRN: [id number] Date of Birth: 20-Jul-1980

## 2019-01-30 ENCOUNTER — Other Ambulatory Visit: Payer: Self-pay

## 2019-01-30 ENCOUNTER — Ambulatory Visit: Payer: Commercial Managed Care - PPO

## 2019-01-30 DIAGNOSIS — R2689 Other abnormalities of gait and mobility: Secondary | ICD-10-CM

## 2019-01-30 DIAGNOSIS — M6281 Muscle weakness (generalized): Secondary | ICD-10-CM | POA: Diagnosis not present

## 2019-01-30 DIAGNOSIS — G8929 Other chronic pain: Secondary | ICD-10-CM

## 2019-01-30 NOTE — Therapy (Signed)
[place] MAIN [facility] SERVICES [address] [place], [place], [address] Phone: [phone]   Fax:  [phone]  Physical Therapy Treatment  Patient Details  Name: Eric Lane MRN: [id number] Date of Birth: Feb 25, 1981 Referring Provider (PT): Dr. Saintclair Halsted   Encounter Date: 01/30/2019  PT End of Session - 01/30/19 1752    Visit Number  8    Number of Visits  16    Date for PT Re-Evaluation  02/23/19    Authorization Type  8/10 eval 12/29/18    PT Start Time  1645    PT Stop Time  1729    PT Time Calculation (min)  44 min    Activity Tolerance  Patient tolerated treatment well    Behavior During Therapy  [facility] for tasks assessed/performed       History reviewed. No pertinent past medical history.  Past Surgical History:  Procedure Laterality Date  . EYE SURGERY    . LASIK Bilateral 09/20/2014    There were no vitals filed for this visit.  Subjective Assessment - 01/30/19 1745    Subjective  Patient reports he had a good thanksgiving. Did a lot of yard work and lifting over the weekend but was careful to lift with his legs, feels sore in the legs but no pain in back.    Pertinent History  Patient received surgery on 11/14/18 for grade I spondylolisthesis L5-S1 with severe spinal stenosis bilateral L5 radiculopathies left greater than right, he underwent PLIF L5-S1. Has seen this therapist earlier this year prior to this surgery and COVID-19 outbreak. Per patient no extension, no sit ups, is ok to do planks. Patient presents in back brace, is supposed to be in for 2 months s/p surgery.  Tingling numbness in big toe of right side. Returned back to work Monday. More sore in the middle of the night.    Limitations  Standing;Walking;Sitting;Lifting;House hold activities    How long can you sit comfortably?  20-30 minutes    How long can you stand comfortably?  hasn't stood still for long duration    How long can you walk comfortably?  has walked 3  miles    Diagnostic tests  s/p surgery    Patient Stated Goals  improve gait/strength, return to biking, golfing, running    Currently in Pain?  No/denies            Prone:   STM with implementation of effleurage and pettrisage with "milking" aspects for reduction of swelling and muscle tissue stiffness.noted stiffness of L lateral musculature with improved lengthening with repetitive focus on quadratus lumborum X31minutes   Quadruped: Child pose to quadruped stretch transition 5x with focus on muscle tissue lengthening  Quadruped with single arm raise clapping PT hand with opposite arm, 10x each side, cueing for exhale for core stability  Quadruped with LE raises, cueing for abdominal activation for reduction of instability 8x each LE, challenging to patient .  Supine:  Modified birddog with swiss ball 10x each side,  Hamstring curl with swiss ball 15x   Hamstring lengthening passive stretch on PT shoulder 60 secondsx3trials each LE, popliteal angle hamstring stretch 60 secondsx2 trials each LE, Piriformis stretch cross body 60 seconds in varying angles x5each LE  Sidelying: RTB clamshells 15x each side Reverse clamshells 15x each side  seated: RTB straight arm lat pull down 10x  Standing: Yellow dynadisc under opp LE, modified single limb stability 2x 30 seconds each LE, more challenging for LLE  than RLE  RTB row, focus on core stability and postural correction 10x Wall pushup, 10x 3 second holds   Pt educated throughout session about proper posture and technique with exercises. Improved exercise technique, movement at target joints, use of target muscles after min to mod verbal, visual, tactile cues        Patient is progressing with functional core and trunk stability with increasing challenge of positions. Patient does have noted differences in LUE and RUE ranges with patient reporting discomfort in LUE with some overhead motions. Increased  hamstring length noted bilaterally however L continues to be more limited than R at this time. Progression of HEP performed with patient demonstrating understanding. Patient will benefit from skilled physical therapy to increase strength, mobility, and decrease pain levels for improved level of function and quality of life             PT Education - 01/30/19 1752    Education Details  exercise proression, body mechanics    Person(s) Educated  Patient    Methods  Explanation;Demonstration;Tactile cues;Verbal cues    Comprehension  Verbalized understanding;Returned demonstration;Verbal cues required;Tactile cues required       PT Short Term Goals - 12/29/18 1736      PT SHORT TERM GOAL #1   Title  Pt will be independent with HEP in order to improve strength and decrease back pain in order to improve pain-free function at home and work.     Baseline  10/29: given    Time  4    Period  Weeks    Status  New    Target Date  01/26/19        PT Long Term Goals - 12/29/18 1737      PT LONG TERM GOAL #1   Title  Patient will reduce modified Oswestry score to <20 as to demonstrate minimal disability with ADLs including improved sleeping tolerance, walking/sitting tolerance etc for better mobility with ADLs.    Baseline  10/29: 36%    Time  8    Period  Weeks    Status  New    Target Date  02/23/19      PT LONG TERM GOAL #2   Title  Patient will increase BLE gross strength to 4+/5 as to improve functional strength for independent gait, increased standing tolerance and increased ADL ability.    Baseline  10/29: LLE grossly 3+/5 RLE 4-/5    Time  8    Period  Weeks    Status  New    Target Date  02/23/19      PT LONG TERM GOAL #3   Title  Patient (< 80 years old) will complete five times sit to stand test in < 10 seconds indicating an increased LE strength and improved balance.    Baseline  10/29: 13 seconds    Time  8    Period  Weeks    Status  New    Target Date   02/23/19      PT LONG TERM GOAL #4   Title  Patient will report average VAS of 1/10 to improve tolerance with ADLs and reduced symptoms with activities.    Baseline  10/29: 3/10    Time  8    Period  Weeks    Status  New    Target Date  02/23/19            Plan - 01/30/19 1753    Clinical Impression Statement  Patient  is progressing with functional core and trunk stability with increasing challenge of positions. Patient does have noted differences in LUE and RUE ranges with patient reporting discomfort in LUE with some overhead motions. Increased hamstring length noted bilaterally however L continues to be more limited than R at this time. Progression of HEP performed with patient demonstrating understanding. Patient will benefit from skilled physical therapy to increase strength, mobility, and decrease pain levels for improved level of function and quality of life    Personal Factors and Comorbidities  Comorbidity 2    Comorbidities  HTN, GERD, hypercholesteremia, hypotestosteronism, cardiac murmur,    Examination-Activity Limitations  Bed Mobility;Bathing;Bend;Caring for Others;Carry;Hygiene/Grooming;Stairs;Squat;Reach Overhead;Locomotion Level;Lift;Stand;Toileting;Transfers    Examination-Participation Restrictions  Church;Cleaning;Community Activity;Interpersonal Relationship;Laundry;Volunteer;Shop;Meal Prep;Yard Work    Conservation officer, historic buildingstability/Clinical Decision Making  Evolving/Moderate complexity    Rehab Potential  Good    PT Frequency  2x / week    PT Duration  8 weeks    PT Treatment/Interventions  ADLs/Self Care Home Management;Biofeedback;Aquatic Therapy;Canalith Repostioning;Cryotherapy;Electrical Stimulation;Iontophoresis 4mg /ml Dexamethasone;Moist Heat;Traction;Ultrasound;DME Instruction;Gait training;Stair training;Functional mobility training;Therapeutic activities;Therapeutic exercise;Balance training;Neuromuscular re-education;Patient/family education;Manual techniques;Passive range of  motion;Dry needling;Energy conservation;Taping;Vestibular;Scar mobilization;Compression bandaging    PT Next Visit Plan  scar tissue massage, STM to lumbar spine, hamstring lengthening, progressive core    PT Home Exercise Plan  see above    Consulted and Agree with Plan of Care  Patient       Patient will benefit from skilled therapeutic intervention in order to improve the following deficits and impairments:  Pain, Postural dysfunction, Decreased strength, Difficulty walking, Abnormal gait, Decreased activity tolerance, Decreased endurance, Decreased scar mobility, Decreased mobility, Decreased range of motion, Hypomobility, Impaired flexibility, Increased muscle spasms, Impaired sensation, Improper body mechanics  Visit Diagnosis: Muscle weakness (generalized)  Other abnormalities of gait and mobility  Chronic midline low back pain, unspecified whether sciatica present     Problem List Patient Active Problem List   Diagnosis Date Noted  . Spondylolisthesis at L5-S1 level 11/14/2018  . Essential hypertension 03/30/2018  . CN (constipation) 08/15/2014  . Elevated blood sugar 08/15/2014  . GERD (gastroesophageal reflux disease) 08/15/2014  . External hemorrhoid 08/15/2014  . Hypercholesteremia 08/15/2014  . Hypotestosteronism 08/15/2014  . Cardiac murmur 08/15/2014  . Awareness of heartbeats 08/15/2014  . Avitaminosis D 08/15/2014   Precious BardMarina Tani Virgo, PT, DPT   01/30/2019, 5:53 PM  [place] [facility] [address] [place], [place], [address] Phone: [phone]   Fax:  [phone]  Name: Eric Lane MRN: [id number] Date of Birth: 05/21/1980

## 2019-02-01 ENCOUNTER — Ambulatory Visit: Payer: Commercial Managed Care - PPO | Attending: Neurosurgery

## 2019-02-01 ENCOUNTER — Other Ambulatory Visit: Payer: Self-pay

## 2019-02-01 DIAGNOSIS — R2689 Other abnormalities of gait and mobility: Secondary | ICD-10-CM | POA: Insufficient documentation

## 2019-02-01 DIAGNOSIS — G8929 Other chronic pain: Secondary | ICD-10-CM | POA: Diagnosis present

## 2019-02-01 DIAGNOSIS — M6281 Muscle weakness (generalized): Secondary | ICD-10-CM | POA: Diagnosis not present

## 2019-02-01 DIAGNOSIS — M545 Low back pain, unspecified: Secondary | ICD-10-CM

## 2019-02-01 NOTE — Therapy (Signed)
[facility] MAIN [facility] SERVICES [address] [place], [place], [address] Phone: [phone]   Fax:  [phone]  Physical Therapy Treatment  Patient Details  Name: Eric Lane MRN: [id number] Date of Birth: 10-29-1980 Referring Provider (PT): Dr. Wynetta Emery   Encounter Date: 02/01/2019  PT End of Session - 02/01/19 1747    Visit Number  9    Number of Visits  16    Date for PT Re-Evaluation  02/23/19    Authorization Type  9/10 eval 12/29/18    PT Start Time  1645    PT Stop Time  1731    PT Time Calculation (min)  46 min    Activity Tolerance  Patient tolerated treatment well    Behavior During Therapy  [facility] for tasks assessed/performed       History reviewed. No pertinent past medical history.  Past Surgical History:  Procedure Laterality Date  . EYE SURGERY    . LASIK Bilateral 09/20/2014    There were no vitals filed for this visit.  Subjective Assessment - 02/01/19 1746    Subjective  Patient reports he will miss next week due to being on a work trip. 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 Has seen this therapist earlier this year prior to this surgery and COVID-19 outbreak. Per patient no extension, no sit ups, is ok to do planks. Patient presents in back brace, is supposed to be in for 2 months s/p surgery.  Tingling numbness in big toe of right side. Returned back to work Monday. More sore in the middle of the night.    Limitations  Standing;Walking;Sitting;Lifting;House hold activities    How long can you sit comfortably?  20-30 minutes    How long can you stand comfortably?  hasn't stood still for long duration    How long can you walk comfortably?  has walked 3 miles    Diagnostic tests  s/p surgery     Patient Stated Goals  improve gait/strength, return to biking, golfing, running    Currently in Pain?  No/denies           Octane fitness Lvl 4 4 minutes seat at 8    supine:   Hamstring lengthening passive stretch on PT shoulder 60 secondsx3trials each LE, popliteal angle hamstring stretch 60 secondsx2 trials each LE, Piriformis stretch cross body 60 seconds in varying angles x5each LE  modified bridge ; posterior pelvic tilt with glute squeeze 15x  2.5 lb ankle weight -SLR 10x each LE with posterior pelvic tilt  -abduction/adduction  straight leg 10x each LE -SAQ with knee on bolster 10x 3 second holds    Seated hamstring stretch added to HEP 60 second holds  seated swiss ball forward rollout 10x, lateral rollout 4 x each direction cueing for chin tuck   STM with implementation of effleurage and pettrisage with "milking" aspects for reduction of swelling and muscle tissue stiffness.noted stiffness of L lateral musculature with improved lengthening with repetitive focus on quadratus [id number]    Pt educated throughout session about proper posture and technique with exercises. Improved exercise technique, movement at target joints, use of target muscles after min to mod verbal, visual, tactile cues  Patient will be gone next week due to a business trip.  Patient is progressing with strength and mobility with implementation of ankle weights for first time. Increased hamstring tightness noted on L but improved with repeated hold/lengthening. Patient will benefit from skilled physical therapy to increase strength, mobility, and decrease pain levels for improved level of function and quality of life    PT Education - 02/01/19 1747    Education Details  exercise technique, body mechanics    Person(s) Educated  Patient    Methods  Explanation;Demonstration;Tactile cues;Verbal cues    Comprehension  Verbalized understanding;Returned demonstration;Verbal cues  required;Tactile cues required       PT Short Term Goals - 12/29/18 1736      PT SHORT TERM GOAL #1   Title  Pt will be independent with HEP in order to improve strength and decrease back pain in order to improve pain-free function at home and work.     Baseline  10/29: given    Time  4    Period  Weeks    Status  New    Target Date  01/26/19        PT Long Term Goals - 12/29/18 1737      PT LONG TERM GOAL #1   Title  Patient will reduce modified Oswestry score to <20 as to demonstrate minimal disability with ADLs including improved sleeping tolerance, walking/sitting tolerance etc for better mobility with ADLs.    Baseline  10/29: 36%    Time  8    Period  Weeks    Status  New    Target Date  02/23/19      PT LONG TERM GOAL #2   Title  Patient will increase BLE gross strength to 4+/5 as to improve functional strength for independent gait, increased standing tolerance and increased ADL ability.    Baseline  10/29: LLE grossly 3+/5 RLE 4-/5    Time  8    Period  Weeks    Status  New    Target Date  02/23/19      PT LONG TERM GOAL #3   Title  Patient (< 37 years old) will complete five times sit to stand test in < 10 seconds indicating an increased LE strength and improved balance.    Baseline  10/29: 13 seconds    Time  8    Period  Weeks    Status  New    Target Date  02/23/19      PT LONG TERM GOAL #4   Title  Patient will report average VAS of 1/10 to improve tolerance with ADLs and reduced symptoms with activities.    Baseline  10/29: 3/10    Time  8    Period  Weeks    Status  New    Target Date  02/23/19            Plan - 02/01/19 1748    Clinical Impression Statement  Patient will be gone next week due to a business trip. Patient is progressing with strength and mobility with implementation of ankle weights for first time. Increased hamstring tightness noted on L but improved with repeated hold/lengthening. Patient will benefit from skilled physical  therapy to increase strength, mobility, and decrease pain levels for improved level of function and quality of life    Personal Factors and Comorbidities  Comorbidity 2    Comorbidities  HTN, GERD, hypercholesteremia, hypotestosteronism, cardiac murmur,    Examination-Activity Limitations  Bed Mobility;Bathing;Bend;Caring for Others;Carry;Hygiene/Grooming;Stairs;Squat;Reach Overhead;Locomotion Level;Lift;Stand;Toileting;Transfers    Examination-Participation Restrictions  Church;Cleaning;Community  Activity;Interpersonal Relationship;Laundry;Volunteer;Shop;Meal Prep;Yard Work    [employer] officer  Evolving/Moderate complexity    Rehab Potential  Good    PT Frequency  2x / week    PT Duration  8 weeks    PT Treatment/Interventions  ADLs/Self Care Home Management;Biofeedback;Aquatic Therapy;Canalith Repostioning;Cryotherapy;Electrical Stimulation;Iontophoresis 4mg /ml Dexamethasone;Moist Heat;Traction;Ultrasound;DME Instruction;Gait training;Stair training;Functional mobility training;Therapeutic activities;Therapeutic exercise;Balance training;Neuromuscular re-education;Patient/family education;Manual techniques;Passive range of motion;Dry needling;Energy conservation;Taping;Vestibular;Scar mobilization;Compression bandaging    PT Next Visit Plan  scar tissue massage, STM to lumbar spine, hamstring lengthening, progressive core    PT Home Exercise Plan  see above    Consulted and Agree with Plan of Care  Patient       Patient will benefit from skilled therapeutic intervention in order to improve the following deficits and impairments:  Pain, Postural dysfunction, Decreased strength, Difficulty walking, Abnormal gait, Decreased activity tolerance, Decreased endurance, Decreased scar mobility, Decreased mobility, Decreased range of motion, Hypomobility, Impaired flexibility, Increased muscle spasms, Impaired sensation, Improper body mechanics  Visit Diagnosis: Muscle weakness  (generalized)  Other abnormalities of gait and mobility  Chronic midline low back pain, unspecified whether sciatica present     Problem List Patient Active Problem List   Diagnosis Date Noted  . Spondylolisthesis at L5-S1 level 11/14/2018  . Essential hypertension 03/30/2018  . CN (constipation) 08/15/2014  . Elevated blood sugar 08/15/2014  . GERD (gastroesophageal reflux disease) 08/15/2014  . External hemorrhoid 08/15/2014  . Hypercholesteremia 08/15/2014  . Hypotestosteronism 08/15/2014  . Cardiac murmur 08/15/2014  . Awareness of heartbeats 08/15/2014  . Avitaminosis D 08/15/2014   Janna Arch, PT, DPT   02/01/2019, 5:49 PM  [place] MAIN [facility] SERVICES [address] [place], [place], [address] Phone: [phone]   Fax:  [phone]  Name: Eric Lane MRN: [id number] Date of Birth: [birth year]

## 2019-02-13 ENCOUNTER — Other Ambulatory Visit: Payer: Self-pay

## 2019-02-13 ENCOUNTER — Ambulatory Visit: Payer: Commercial Managed Care - PPO

## 2019-02-13 DIAGNOSIS — R2689 Other abnormalities of gait and mobility: Secondary | ICD-10-CM

## 2019-02-13 DIAGNOSIS — M6281 Muscle weakness (generalized): Secondary | ICD-10-CM

## 2019-02-13 DIAGNOSIS — G8929 Other chronic pain: Secondary | ICD-10-CM

## 2019-02-13 NOTE — Therapy (Signed)
[place] MAIN [facility] SERVICES [address] [place], [place], [address] Phone: [phone]   Fax:  [phone]  Physical Therapy Treatment Physical Therapy Progress Note   Dates of reporting period  12/29/18   to  02/13/19  Patient Details  Name: Eric Lane MRN: [id number] Date of Birth: 04-19-1980 Referring Provider (PT): Dr. Saintclair Halsted   Encounter Date: 02/13/2019  PT End of Session - 02/13/19 1608    Visit Number  10    Number of Visits  16    Date for PT Re-Evaluation  02/23/19    Authorization Type  10/10 eval 12/29/18; next session 1/10 PN 02/13/19    PT Start Time  1607    PT Stop Time  1650    PT Time Calculation (min)  43 min    Activity Tolerance  Patient tolerated treatment well    Behavior During Therapy  [facility] for tasks assessed/performed       History reviewed. No pertinent past medical history.  Past Surgical History:  Procedure Laterality Date  . EYE SURGERY    . LASIK Bilateral 09/20/2014    There were no vitals filed for this visit.  Subjective Assessment - 02/13/19 1706    Subjective  Patient has returned from his trip. Has been able to start the eliptical and did a 2 mile walk the previous day. Has little to no pain in back on trip but did have some today when lifting boxes at work. Woke up with stiffness in lateral low back region.    Pertinent History  Patient received surgery on 11/14/18 for grade I spondylolisthesis L5-S1 with severe spinal stenosis bilateral L5 radiculopathies left greater than right, he underwent PLIF L5-S1. Has seen this therapist earlier this year prior to this surgery and COVID-19 outbreak. Per patient no extension, no sit ups, is ok to do planks. Patient presents in back brace, is supposed to be in for 2 months s/p surgery.  Tingling numbness in big toe of right side. Returned back to work Monday. More sore in the middle of the night.    Limitations  Standing;Walking;Sitting;Lifting;House hold  activities    How long can you sit comfortably?  20-30 minutes    How long can you stand comfortably?  hasn't stood still for long duration    How long can you walk comfortably?  has walked 3 miles    Diagnostic tests  s/p surgery    Patient Stated Goals  improve gait/strength, return to biking, golfing, running    Currently in Pain?  Yes    Pain Score  2     Pain Location  Back    Pain Orientation  Lower    Pain Descriptors / Indicators  Aching    Pain Type  Chronic pain    Pain Radiating Towards  radiating laterally    Pain Onset  Today    Pain Frequency  Intermittent    Aggravating Factors   picking up boxes at work            Patient has returned from his trip. Has been able to start the eliptical and did a 2 mile walk the previous day. Has little to no pain in back on trip but did have some today when lifting boxes at work. Woke up with stiffness in lateral low back region.     Progress note MODI LE strength: LLE hip flex, adduct, and extension 4-/5, abduction and knees 4/5 RLE 4/5 5x STS: 9  seconds VAS: 3/10 ; overall pain is improving with decreased days of bad pain   STM with implementation of effleurage and pettrisage with "milking" aspects for reduction of swelling and muscle tissue stiffness.noted stiffness of bilateral lateral musculature with improved lengthening with repetitive focus on quadratus [id number]  Supine:  Hamstring lengthening passive stretch on PT shoulder 60 secondsx3trials each LE, popliteal angle hamstring stretch 60 secondsx2 trials each LE, Piriformis stretch cross body 60 seconds in varying angles x5each LE modified bridge ; posterior pelvic tilt with glute squeeze and abduction with BTB 3x at top of bridge 15x posterior pelvic tilt with adduction ball squeeze 12x 3 second holds Robber stretch old for postural correction 30 seconds in varying arc of stretch   Patient's condition has the potential to improve in response to  therapy. Maximum improvement is yet to be obtained. The anticipated improvement is attainable and reasonable in a generally predictable time.  Patient reports his pain is improving with decreased episodes of pain and even is having some days where he forgets he has pain. He continues to be limited in strength and return to activities such a biking and golfing.               PT Education - 02/13/19 1607    Education Details  goals, exercise technique, body mechanics    Person(s) Educated  Patient    Methods  Explanation;Demonstration;Tactile cues;Verbal cues    Comprehension  Verbalized understanding;Returned demonstration;Verbal cues required;Tactile cues required       PT Short Term Goals - 02/13/19 1709      PT SHORT TERM GOAL #1   Title  Pt will be independent with HEP in order to improve strength and decrease back pain in order to improve pain-free function at home and work.     Baseline  10/29: given 12/14: HEP compliant    Time  4    Period  Weeks    Status  Achieved    Target Date  01/26/19        PT Long Term Goals - 02/13/19 1710      PT LONG TERM GOAL #1   Title  Patient will reduce modified Oswestry score to <20 as to demonstrate minimal disability with ADLs including improved sleeping tolerance, walking/sitting tolerance etc for better mobility with ADLs.    Baseline  10/29: 36% 12/14: 30 %    Time  8    Period  Weeks    Status  Partially Met    Target Date  02/23/19      PT LONG TERM GOAL #2   Title  Patient will increase BLE gross strength to 4+/5 as to improve functional strength for independent gait, increased standing tolerance and increased ADL ability.    Baseline  10/29: LLE grossly 3+/5 RLE 4-/5 12/14: LLE hip flex, adduct, and extension 4-/5, abduction and knees 4/5 RLE 4/5    Time  8    Period  Weeks    Status  Partially Met    Target Date  02/23/19      PT LONG TERM GOAL #3   Title  Patient (< 89years old) will complete five times sit to  stand test in < 10 seconds indicating an increased LE strength and improved balance.    Baseline  10/29: 13 seconds 12/14: 9 seconds    Time  8    Period  Weeks    Status  Achieved      PT LONG TERM GOAL #  4   Title  Patient will report average VAS of 1/10 to improve tolerance with ADLs and reduced symptoms with activities.    Baseline  10/29: 3/10 12/14: 3/10    Time  8    Period  Weeks    Status  On-going    Target Date  02/23/19            Plan - 02/13/19 1714    Clinical Impression Statement  Patient is progressing towards functional goals with improved ability to transfer, improved LE strength, and improved function. His pain levels are about the same in regards to worst pain however he is having decreased pain frequency and frequently now has no pain. Patient's condition has the potential to improve in response to therapy. Maximum improvement is yet to be obtained. The anticipated improvement is attainable and reasonable in a generally predictable time.  Patient will benefit from skilled physical therapy to increase strength, mobility, and decrease pain levels for improved level of function and quality of life    Personal Factors and Comorbidities  Comorbidity 2    Comorbidities  HTN, GERD, hypercholesteremia, hypotestosteronism, cardiac murmur,    Examination-Activity Limitations  Bed Mobility;Bathing;Bend;Caring for Others;Carry;Hygiene/Grooming;Stairs;Squat;Reach Overhead;Locomotion Level;Lift;Stand;Toileting;Transfers    Examination-Participation Restrictions  Church;Cleaning;Community Activity;Interpersonal Relationship;Laundry;Volunteer;Shop;Meal Prep;Yard Work    [employer] officer  Evolving/Moderate complexity    Rehab Potential  Good    PT Frequency  2x / week    PT Duration  8 weeks    PT Treatment/Interventions  ADLs/Self Care Home Management;Biofeedback;Aquatic Therapy;Canalith Repostioning;Cryotherapy;Electrical Stimulation;Iontophoresis 36m/ml  Dexamethasone;Moist Heat;Traction;Ultrasound;DME Instruction;Gait training;Stair training;Functional mobility training;Therapeutic activities;Therapeutic exercise;Balance training;Neuromuscular re-education;Patient/family education;Manual techniques;Passive range of motion;Dry needling;Energy conservation;Taping;Vestibular;Scar mobilization;Compression bandaging    PT Next Visit Plan  scar tissue massage, STM to lumbar spine, hamstring lengthening, progressive core    PT Home Exercise Plan  see above    Consulted and Agree with Plan of Care  Patient       Patient will benefit from skilled therapeutic intervention in order to improve the following deficits and impairments:  Pain, Postural dysfunction, Decreased strength, Difficulty walking, Abnormal gait, Decreased activity tolerance, Decreased endurance, Decreased scar mobility, Decreased mobility, Decreased range of motion, Hypomobility, Impaired flexibility, Increased muscle spasms, Impaired sensation, Improper body mechanics  Visit Diagnosis: Muscle weakness (generalized)  Other abnormalities of gait and mobility  Chronic midline low back pain, unspecified whether sciatica present     Problem List Patient Active Problem List   Diagnosis Date Noted  . Spondylolisthesis at L5-S1 level 11/14/2018  . Essential hypertension 03/30/2018  . CN (constipation) 08/15/2014  . Elevated blood sugar 08/15/2014  . GERD (gastroesophageal reflux disease) 08/15/2014  . External hemorrhoid 08/15/2014  . Hypercholesteremia 08/15/2014  . Hypotestosteronism 08/15/2014  . Cardiac murmur 08/15/2014  . Awareness of heartbeats 08/15/2014  . Avitaminosis D 08/15/2014   MJanna Arch PT, DPT   02/13/2019, 5:15 PM  [place] [facility] [address]RH[address] [place] [address]Phone: [phone]  Fax:  [phone] Name: Eric LEATHAMMRN: [id number] of Birth: 120-Oct-1982

## 2019-02-15 ENCOUNTER — Ambulatory Visit: Payer: Commercial Managed Care - PPO

## 2019-02-15 ENCOUNTER — Other Ambulatory Visit: Payer: Self-pay

## 2019-02-15 DIAGNOSIS — M6281 Muscle weakness (generalized): Secondary | ICD-10-CM

## 2019-02-15 DIAGNOSIS — G8929 Other chronic pain: Secondary | ICD-10-CM

## 2019-02-15 DIAGNOSIS — R2689 Other abnormalities of gait and mobility: Secondary | ICD-10-CM

## 2019-02-16 NOTE — Therapy (Signed)
[place] MAIN [facility] SERVICES [address] [place], [place], [address] Phone: [phone]   Fax:  [phone]  Physical Therapy Treatment  Patient Details  Name: Eric Lane MRN: [id number] Date of Birth: [birth year] Referring Provider (PT): Dr. Saintclair Halsted   Encounter Date: 02/15/2019  PT End of Session - 02/16/19 0915    Visit Number  11    Number of Visits  16    Date for PT Re-Evaluation  02/23/19    Authorization Type  1/10 PN 02/13/19    PT Start Time  1639    PT Stop Time  1729    PT Time Calculation (min)  50 min    Activity Tolerance  Patient tolerated treatment well    Behavior During Therapy  [facility] for tasks assessed/performed       History reviewed. No pertinent past medical history.  Past Surgical History:  Procedure Laterality Date  . EYE SURGERY    . LASIK Bilateral 09/20/2014    There were no vitals filed for this visit.  Subjective Assessment - 02/16/19 0914    Subjective  Patient reports he has to work a full shift standing tomorrow. Has changed his pillows and is noticing slight improvement in tightness in his sides. Has been compliant with HEP.    Pertinent History  Patient received surgery on 11/14/18 for grade I spondylolisthesis L5-S1 with severe spinal stenosis bilateral L5 radiculopathies left greater than right, he underwent PLIF L5-S1. Has seen this therapist earlier this year prior to this surgery and COVID-19 outbreak. Per patient no extension, no sit ups, is ok to do planks. Patient presents in back brace, is supposed to be in for 2 months s/p surgery.  Tingling numbness in big toe of right side. Returned back to work Monday. More sore in the middle of the night.    Limitations  Standing;Walking;Sitting;Lifting;House hold activities    How long can you sit comfortably?  20-30 minutes    How long can you stand comfortably?  hasn't stood still for long duration    How long can you walk comfortably?  has walked 3  miles    Diagnostic tests  s/p surgery    Patient Stated Goals  improve gait/strength, return to biking, golfing, running    Currently in Pain?  No/denies        Supine: Hamstring stretch 60 second holds each LE, 2 sets, popliteal angle hold 60 seconds each LE, 2 sets, piriformis 60 second holds 2 sets each LE  Against a wall: Modified wall squat: cueing for posterior pelvic tilt against wall, partial squat down hold 3 seconds, return to upright position while maintaining posterior tilt 10x Modified wall pushup, tactile cueing for neutral spinal alignment 10x  Kneeling: Sit on heels, gluteal activation with core contraction to raise to tall kneeling x 10 trials  Quadruped:  Quadruped kick backs: cueing for pelvic control with TrA activation with LE kick back , patient challenged with control with returning leg to position with slight tilt noted, 10x each side  Arms on bosu ball: flat side up: static hold 30 seconds, forward posterior tilts 10x, lateral tilts 10x, very challenging for patient by end of set  Standing stretches for work: Standing hamstring stretch 30 second holds with two variations x2 trials each LE Standing weight shift to decrease low back tension with prolonged standing 10x  Prone: STM to lumbar paraspinals with implements of effleurage and ptrissage for reduction of muscle tissue tension and trigger  point release to lateral trunk musculature x 8 minutes.      Pt educated throughout session about proper posture and technique with exercises. Improved exercise technique, movement at target joints, use of target muscles after min to mod verbal, visual, tactile cues.  Patient continues to demonstrate excellent motivation throughout physical therapy session with continued progression of strengthening and proper alignment for reduction of symptoms and return to PLOF. He is progressing with core stability however continues to be limited with prolonged contraction fatiguing  quickly. Patient will benefit from skilled physical therapy to increase strength, mobility, and decrease pain levels for improved level of function and quality of life                  PT Education - 02/16/19 0915    Education Details  exercise technique, posture, manual    Person(s) Educated  Patient    Methods  Explanation;Demonstration;Tactile cues;Verbal cues    Comprehension  Verbalized understanding;Returned demonstration;Verbal cues required;Tactile cues required       PT Short Term Goals - 02/13/19 1709      PT SHORT TERM GOAL #1   Title  Pt will be independent with HEP in order to improve strength and decrease back pain in order to improve pain-free function at home and work.     Baseline  10/29: given 12/14: HEP compliant    Time  4    Period  Weeks    Status  Achieved    Target Date  01/26/19        PT Long Term Goals - 02/13/19 1710      PT LONG TERM GOAL #1   Title  Patient will reduce modified Oswestry score to <20 as to demonstrate minimal disability with ADLs including improved sleeping tolerance, walking/sitting tolerance etc for better mobility with ADLs.    Baseline  10/29: 36% 12/14: 30 %    Time  8    Period  Weeks    Status  Partially Met    Target Date  02/23/19      PT LONG TERM GOAL #2   Title  Patient will increase BLE gross strength to 4+/5 as to improve functional strength for independent gait, increased standing tolerance and increased ADL ability.    Baseline  10/29: LLE grossly 3+/5 RLE 4-/5 12/14: LLE hip flex, adduct, and extension 4-/5, abduction and knees 4/5 RLE 4/5    Time  8    Period  Weeks    Status  Partially Met    Target Date  02/23/19      PT LONG TERM GOAL #3   Title  Patient (< 49 years old) will complete five times sit to stand test in < 10 seconds indicating an increased LE strength and improved balance.    Baseline  10/29: 13 seconds 12/14: 9 seconds    Time  8    Period  Weeks    Status  Achieved      PT  LONG TERM GOAL #4   Title  Patient will report average VAS of 1/10 to improve tolerance with ADLs and reduced symptoms with activities.    Baseline  10/29: 3/10 12/14: 3/10    Time  8    Period  Weeks    Status  On-going    Target Date  02/23/19            Plan - 02/16/19 1412    Clinical Impression Statement  Patient continues to demonstrate excellent motivation  throughout physical therapy session with continued progression of strengthening and proper alignment for reduction of symptoms and return to PLOF. He is progressing with core stability however continues to be limited with prolonged contraction fatiguing quickly. Patient will benefit from skilled physical therapy to increase strength, mobility, and decrease pain levels for improved level of function and quality of life    Personal Factors and Comorbidities  Comorbidity 2    Comorbidities  HTN, GERD, hypercholesteremia, hypotestosteronism, cardiac murmur,    Examination-Activity Limitations  Bed Mobility;Bathing;Bend;Caring for Others;Carry;Hygiene/Grooming;Stairs;Squat;Reach Overhead;Locomotion Level;Lift;Stand;Toileting;Transfers    Examination-Participation Restrictions  Church;Cleaning;Community Activity;Interpersonal Relationship;Laundry;Volunteer;Shop;Meal Prep;Yard Work    [employer] officer  Evolving/Moderate complexity    Rehab Potential  Good    PT Frequency  2x / week    PT Duration  8 weeks    PT Treatment/Interventions  ADLs/Self Care Home Management;Biofeedback;Aquatic Therapy;Canalith Repostioning;Cryotherapy;Electrical Stimulation;Iontophoresis 24m/ml Dexamethasone;Moist Heat;Traction;Ultrasound;DME Instruction;Gait training;Stair training;Functional mobility training;Therapeutic activities;Therapeutic exercise;Balance training;Neuromuscular re-education;Patient/family education;Manual techniques;Passive range of motion;Dry needling;Energy conservation;Taping;Vestibular;Scar mobilization;Compression  bandaging    PT Next Visit Plan  scar tissue massage, STM to lumbar spine, hamstring lengthening, progressive core    PT Home Exercise Plan  see above    Consulted and Agree with Plan of Care  Patient       Patient will benefit from skilled therapeutic intervention in order to improve the following deficits and impairments:  Pain, Postural dysfunction, Decreased strength, Difficulty walking, Abnormal gait, Decreased activity tolerance, Decreased endurance, Decreased scar mobility, Decreased mobility, Decreased range of motion, Hypomobility, Impaired flexibility, Increased muscle spasms, Impaired sensation, Improper body mechanics  Visit Diagnosis: Muscle weakness (generalized)  Other abnormalities of gait and mobility  Chronic midline low back pain, unspecified whether sciatica present     Problem List Patient Active Problem List   Diagnosis Date Noted  . Spondylolisthesis at L5-S1 level 11/14/2018  . Essential hypertension 03/30/2018  . CN (constipation) 08/15/2014  . Elevated blood sugar 08/15/2014  . GERD (gastroesophageal reflux disease) 08/15/2014  . External hemorrhoid 08/15/2014  . Hypercholesteremia 08/15/2014  . Hypotestosteronism 08/15/2014  . Cardiac murmur 08/15/2014  . Awareness of heartbeats 08/15/2014  . Avitaminosis D 08/15/2014   MJanna Arch PT, DPT   02/16/2019, 2:13 PM  [place] [facility] [address] [address]Phone: [phone]  Fax:  [phone] Name: Eric OBERHOLZERMRN: [id number] of Birth: [birth year]

## 2019-02-20 ENCOUNTER — Other Ambulatory Visit: Payer: Self-pay

## 2019-02-20 ENCOUNTER — Ambulatory Visit: Payer: Commercial Managed Care - PPO

## 2019-02-20 DIAGNOSIS — G8929 Other chronic pain: Secondary | ICD-10-CM

## 2019-02-20 DIAGNOSIS — R2689 Other abnormalities of gait and mobility: Secondary | ICD-10-CM

## 2019-02-20 DIAGNOSIS — M6281 Muscle weakness (generalized): Secondary | ICD-10-CM

## 2019-02-20 NOTE — Therapy (Signed)
[place] MAIN [facility] SERVICES [address] [place], [place], [address] Phone: [phone]   Fax:  [phone]  Physical Therapy Treatment  Patient Details  Name: Eric Lane MRN: [id number] Date of Birth: 04-22-1980 Referring Provider (PT): Dr. Saintclair Halsted   Encounter Date: 02/20/2019  PT End of Session - 02/20/19 1741    Visit Number  12    Number of Visits  16    Date for PT Re-Evaluation  02/23/19    Authorization Type  2/10 PN 02/13/19    PT Start Time  1600    PT Stop Time  1644    PT Time Calculation (min)  44 min    Activity Tolerance  Patient tolerated treatment well    Behavior During Therapy  [facility] for tasks assessed/performed       History reviewed. No pertinent past medical history.  Past Surgical History:  Procedure Laterality Date  . EYE SURGERY    . LASIK Bilateral 09/20/2014    There were no vitals filed for this visit.  Subjective Assessment - 02/20/19 1737    Subjective  Patient reports the 8 hour shift standing was not too bad, stretched throughout it. Has had lateral trunk stiffness waking him up in the night the past two nights.    Pertinent History  Patient received surgery on 11/14/18 for grade I spondylolisthesis L5-S1 with severe spinal stenosis bilateral L5 radiculopathies left greater than right, he underwent PLIF L5-S1. Has seen this therapist earlier this year prior to this surgery and COVID-19 outbreak. Per patient no extension, no sit ups, is ok to do planks. Patient presents in back brace, is supposed to be in for 2 months s/p surgery.  Tingling numbness in big toe of right side. Returned back to work Monday. More sore in the middle of the night.    Limitations  Standing;Walking;Sitting;Lifting;House hold activities    How long can you sit comfortably?  20-30 minutes    How long can you stand comfortably?  hasn't stood still for long duration    How long can you walk comfortably?  has walked 3 miles     Diagnostic tests  s/p surgery    Patient Stated Goals  improve gait/strength, return to biking, golfing, running    Currently in Pain?  Yes    Pain Score  3     Pain Location  Back    Pain Orientation  Lower    Pain Descriptors / Indicators  Aching;Tightness    Pain Type  Chronic pain    Pain Radiating Towards  lateral trunk    Pain Onset  In the past 7 days    Pain Frequency  Intermittent    Aggravating Factors   sleeping    Pain Relieving Factors  stretching           Supine: Hamstring stretch 60 second holds each LE, 2 sets, popliteal angle hold 60 seconds each LE, 2 sets, piriformis 60 second holds 2 sets each LE  Bridge 10x 3 second, cueing for posterior pelvic tilt, glute squeeze and then raise   Bridge with GTB around knees, upon entering top of bridge abduct 3x then return back down 10x; very fatiguing by end of session  2lb ankle weight: SLR with opp LE in hooklying 10x  Seated: Squat sit to stands with 7lb dumbbells in each hand cueing for neutral spinal alignment Dumbbells held to chest (7lb) squat sit to stands 10x, cueing for eccentric control  Quadruped:  Quadruped kick backs: cueing for pelvic control with TrA activation with LE kick back , patient challenged with control with returning leg to position with slight tilt noted, 10x each side  Bird dogs 8x each side, very challenging for cross body arm,leg raise   Prone: STM to lumbar paraspinals with implements of effleurage and ptrissage for reduction of muscle tissue tension and trigger point release to lateral trunk musculature x 7 minutes.     Pt educated throughout session about proper posture and technique with exercises. Improved exercise technique, movement at target joints, use of target muscles after min to mod verbal, visual, tactile cues.  Patient presents with good motivation throughout physical therapy session. Progression towards weighted interventions performed with patient tolerating  increase well. Core stability continues to challenge him with cross body interventions. Patient will benefit from skilled physical therapy to increase strength, mobility, and decrease pain levels for improved level of function and quality of life                  PT Education - 02/20/19 1738    Education Details  exercise technique, body mechanics, posture    Person(s) Educated  Patient    Methods  Explanation;Demonstration;Tactile cues;Verbal cues    Comprehension  Verbalized understanding;Returned demonstration;Verbal cues required;Tactile cues required       PT Short Term Goals - 02/13/19 1709      PT SHORT TERM GOAL #1   Title  Pt will be independent with HEP in order to improve strength and decrease back pain in order to improve pain-free function at home and work.     Baseline  10/29: given 12/14: HEP compliant    Time  4    Period  Weeks    Status  Achieved    Target Date  01/26/19        PT Long Term Goals - 02/13/19 1710      PT LONG TERM GOAL #1   Title  Patient will reduce modified Oswestry score to <20 as to demonstrate minimal disability with ADLs including improved sleeping tolerance, walking/sitting tolerance etc for better mobility with ADLs.    Baseline  10/29: 36% 12/14: 30 %    Time  8    Period  Weeks    Status  Partially Met    Target Date  02/23/19      PT LONG TERM GOAL #2   Title  Patient will increase BLE gross strength to 4+/5 as to improve functional strength for independent gait, increased standing tolerance and increased ADL ability.    Baseline  10/29: LLE grossly 3+/5 RLE 4-/5 12/14: LLE hip flex, adduct, and extension 4-/5, abduction and knees 4/5 RLE 4/5    Time  8    Period  Weeks    Status  Partially Met    Target Date  02/23/19      PT LONG TERM GOAL #3   Title  Patient (< 9 years old) will complete five times sit to stand test in < 10 seconds indicating an increased LE strength and improved balance.    Baseline  10/29:  13 seconds 12/14: 9 seconds    Time  8    Period  Weeks    Status  Achieved      PT LONG TERM GOAL #4   Title  Patient will report average VAS of 1/10 to improve tolerance with ADLs and reduced symptoms with activities.    Baseline  10/29: 3/10 12/14:  3/10    Time  8    Period  Weeks    Status  On-going    Target Date  02/23/19            Plan - 02/20/19 1742    Clinical Impression Statement  Patient presents with good motivation throughout physical therapy session. Progression towards weighted interventions performed with patient tolerating increase well. Core stability continues to challenge him with cross body interventions. Patient will benefit from skilled physical therapy to increase strength, mobility, and decrease pain levels for improved level of function and quality of life    Personal Factors and Comorbidities  Comorbidity 2    Comorbidities  HTN, GERD, hypercholesteremia, hypotestosteronism, cardiac murmur,    Examination-Activity Limitations  Bed Mobility;Bathing;Bend;Caring for Others;Carry;Hygiene/Grooming;Stairs;Squat;Reach Overhead;Locomotion Level;Lift;Stand;Toileting;Transfers    Examination-Participation Restrictions  Church;Cleaning;Community Activity;Interpersonal Relationship;Laundry;Volunteer;Shop;Meal Prep;Yard Work    [employer] officer  Evolving/Moderate complexity    Rehab Potential  Good    PT Frequency  2x / week    PT Duration  8 weeks    PT Treatment/Interventions  ADLs/Self Care Home Management;Biofeedback;Aquatic Therapy;Canalith Repostioning;Cryotherapy;Electrical Stimulation;Iontophoresis 82m/ml Dexamethasone;Moist Heat;Traction;Ultrasound;DME Instruction;Gait training;Stair training;Functional mobility training;Therapeutic activities;Therapeutic exercise;Balance training;Neuromuscular re-education;Patient/family education;Manual techniques;Passive range of motion;Dry needling;Energy conservation;Taping;Vestibular;Scar  mobilization;Compression bandaging    PT Next Visit Plan  recert    PT Home Exercise Plan  see above    Consulted and Agree with Plan of Care  Patient       Patient will benefit from skilled therapeutic intervention in order to improve the following deficits and impairments:  Pain, Postural dysfunction, Decreased strength, Difficulty walking, Abnormal gait, Decreased activity tolerance, Decreased endurance, Decreased scar mobility, Decreased mobility, Decreased range of motion, Hypomobility, Impaired flexibility, Increased muscle spasms, Impaired sensation, Improper body mechanics  Visit Diagnosis: Muscle weakness (generalized)  Other abnormalities of gait and mobility  Chronic midline low back pain, unspecified whether sciatica present     Problem List Patient Active Problem List   Diagnosis Date Noted  . Spondylolisthesis at L5-S1 level 11/14/2018  . Essential hypertension 03/30/2018  . CN (constipation) 08/15/2014  . Elevated blood sugar 08/15/2014  . GERD (gastroesophageal reflux disease) 08/15/2014  . External hemorrhoid 08/15/2014  . Hypercholesteremia 08/15/2014  . Hypotestosteronism 08/15/2014  . Cardiac murmur 08/15/2014  . Awareness of heartbeats 08/15/2014  . Avitaminosis D 08/15/2014   MJanna Arch PT, DPT    02/20/2019, 5:43 PM  [place]MAIN [facility] CenterSERV[facility] [address] [address]Phone: [phone]  Fax:  [phone] Name: JJOHNCARLOS HOLTSCLAWMRN: [id number] of Birth: 118-Jun-1982

## 2019-02-22 ENCOUNTER — Other Ambulatory Visit: Payer: Self-pay

## 2019-02-22 ENCOUNTER — Ambulatory Visit: Payer: Commercial Managed Care - PPO

## 2019-02-22 DIAGNOSIS — G8929 Other chronic pain: Secondary | ICD-10-CM

## 2019-02-22 DIAGNOSIS — R2689 Other abnormalities of gait and mobility: Secondary | ICD-10-CM

## 2019-02-22 DIAGNOSIS — M6281 Muscle weakness (generalized): Secondary | ICD-10-CM | POA: Diagnosis not present

## 2019-02-23 NOTE — Therapy (Signed)
[place] MAIN [facility] SERVICES [address] [place], [place], [address] Phone: [phone]   Fax:  [phone]  Physical Therapy Treatment/RECERT  Patient Details  Name: Eric Lane MRN: [id number] Date of Birth: 23-Feb-1981 Referring Provider (PT): Dr. Saintclair Halsted   Encounter Date: 02/22/2019  PT End of Session - 02/23/19 1152    Visit Number  13    Number of Visits  29    Date for PT Re-Evaluation  04/19/19    Authorization Type  3/10 PN 02/13/19    PT Start Time  1645    PT Stop Time  1728    PT Time Calculation (min)  43 min    Activity Tolerance  Patient tolerated treatment well    Behavior During Therapy  [employer] for tasks assessed/performed       History reviewed. No pertinent past medical history.  Past Surgical History:  Procedure Laterality Date  . EYE SURGERY    . LASIK Bilateral 09/20/2014    There were no vitals filed for this visit.  Subjective Assessment - 02/23/19 1150    Subjective  Patient reports soreness after last session. Has not been sleeping well with lateral trunk pain waking him up at night.    Pertinent History  Patient received surgery on 11/14/18 for grade I spondylolisthesis L5-S1 with severe spinal stenosis bilateral L5 radiculopathies left greater than right, he underwent PLIF L5-S1. Has seen this therapist earlier this year prior to this surgery and COVID-19 outbreak. Per patient no extension, no sit ups, is ok to do planks. Patient presents in back brace, is supposed to be in for 2 months s/p surgery.  Tingling numbness in big toe of right side. Returned back to work Monday. More sore in the middle of the night.    Limitations  Standing;Walking;Sitting;Lifting;House hold activities    How long can you sit comfortably?  20-30 minutes    How long can you stand comfortably?  hasn't stood still for long duration    How long can you walk comfortably?  has walked 3 miles    Diagnostic tests  s/p surgery    Patient  Stated Goals  improve gait/strength, return to biking, golfing, running    Currently in Pain?  Yes    Pain Score  3     Pain Location  Back    Pain Orientation  Lower    Pain Descriptors / Indicators  Aching;Tightness    Pain Type  Chronic pain    Pain Onset  In the past 7 days    Pain Frequency  Intermittent    Aggravating Factors   sleeping          Supine: Hamstring stretch 60 second holds each LE, 2 sets, popliteal angle hold 60 seconds each LE, 2 sets, piriformis 60 second holds 2 sets each LE posterior pelvic tilts 10x modified bridge ; posterior pelvic tilt with glute squeeze and abduction with BTB 3x at top of bridge 15x Open prayer modified stretch of UE's only 10x   Prone: STM to lumbar paraspinals with implements of effleurage and ptrissage for reduction of muscle tissue tension and trigger point release to lateral trunk musculature x 13 minutes, tenderness to L lower region/quadratus that improved with repeated trigger point release to knot.  Grade II  mobilizations to thoracic spine UPAs and CPAs 5x 15 seconds each level, improved posture with decreased forward head rounded shoulders s/p.  Roller to piriformis and quadratus bilaterally x 5 minutes  seated: Forward swiss ball rollouts 8x 10 second holds,  Lateral swiss ball rollouts with cueing for chin tuck 8x each direction 10 second holds.    education on sleeping positions for reduced pain. Patient verbalized understanding.   Goals performed 12/14, please refer to this note for progression towards functional outcome measures. Patient is demonstrating excellent progression status post surgery and remains compliant to protocol. Decreased frequency of pain is noted with improved mobility. This session patient presented with increased muscle tissue tightness, re-education on sleeping positions performed with focus on reduction of symptoms and improved posture. Patient will benefit from skilled physical therapy to  increase strength, mobility, and decrease pain levels for improved level of function and quality of life               PT Education - 02/23/19 1151    Education Details  exercise technique, body mechanics, manual, POC    Person(s) Educated  Patient    Methods  Explanation;Demonstration;Tactile cues;Verbal cues    Comprehension  Verbalized understanding;Returned demonstration;Verbal cues required;Tactile cues required       PT Short Term Goals - 02/23/19 1157      PT SHORT TERM GOAL #1   Title  Pt will be independent with HEP in order to improve strength and decrease back pain in order to improve pain-free function at home and work.     Baseline  10/29: given 12/14: HEP compliant    Time  4    Period  Weeks    Status  Achieved    Target Date  01/26/19        PT Long Term Goals - 02/23/19 1158      PT LONG TERM GOAL #1   Title  Patient will reduce modified Oswestry score to <20 as to demonstrate minimal disability with ADLs including improved sleeping tolerance, walking/sitting tolerance etc for better mobility with ADLs.    Baseline  10/29: 36% 12/14: 30 %    Time  8    Period  Weeks    Status  Partially Met    Target Date  04/19/19      PT LONG TERM GOAL #2   Title  Patient will increase BLE gross strength to 4+/5 as to improve functional strength for independent gait, increased standing tolerance and increased ADL ability.    Baseline  10/29: LLE grossly 3+/5 RLE 4-/5 12/14: LLE hip flex, adduct, and extension 4-/5, abduction and knees 4/5 RLE 4/5    Time  8    Period  Weeks    Status  Partially Met    Target Date  04/19/19      PT LONG TERM GOAL #3   Title  Patient (< 40 years old) will complete five times sit to stand test in < 10 seconds indicating an increased LE strength and improved balance.    Baseline  10/29: 13 seconds 12/14: 9 seconds    Time  8    Period  Weeks    Status  Achieved      PT LONG TERM GOAL #4   Title  Patient will report average  VAS of 1/10 to improve tolerance with ADLs and reduced symptoms with activities.    Baseline  10/29: 3/10 12/14: 3/10    Time  8    Period  Weeks    Status  On-going    Target Date  04/19/19      PT LONG TERM GOAL #5   Title  Patient will tolerate  gym program within protocol weight limit including: spin bike, weight program, and cardio to return to PLOF.    Baseline  12/24: has not started gym program yet at this time    Time  8    Period  Weeks    Status  New    Target Date  04/19/19            Plan - 02/23/19 1201    Clinical Impression Statement  Goals performed 12/14, please refer to this note for progression towards functional outcome measures. Patient is demonstrating excellent progression status post surgery and remains compliant to protocol. Decreased frequency of pain is noted with improved mobility. This session patient presented with increased muscle tissue tightness, re-education on sleeping positions performed with focus on reduction of symptoms and improved posture. Patient will benefit from skilled physical therapy to increase strength, mobility, and decrease pain levels for improved level of function and quality of life    Personal Factors and Comorbidities  Comorbidity 2    Comorbidities  HTN, GERD, hypercholesteremia, hypotestosteronism, cardiac murmur,    Examination-Activity Limitations  Bed Mobility;Bathing;Bend;Caring for Others;Carry;Hygiene/Grooming;Stairs;Squat;Reach Overhead;Locomotion Level;Lift;Stand;Toileting;Transfers    Examination-Participation Restrictions  Church;Cleaning;Community Activity;Interpersonal Relationship;Laundry;Volunteer;Shop;Meal Prep;Yard Work    [employer] officer  Evolving/Moderate complexity    Rehab Potential  Good    PT Frequency  2x / week    PT Duration  8 weeks    PT Treatment/Interventions  ADLs/Self Care Home Management;Biofeedback;Aquatic Therapy;Canalith Repostioning;Cryotherapy;Electrical  Stimulation;Iontophoresis 45m/ml Dexamethasone;Moist Heat;Traction;Ultrasound;DME Instruction;Gait training;Stair training;Functional mobility training;Therapeutic activities;Therapeutic exercise;Balance training;Neuromuscular re-education;Patient/family education;Manual techniques;Passive range of motion;Dry needling;Energy conservation;Taping;Vestibular;Scar mobilization;Compression bandaging    PT Next Visit Plan  bike    PT Home Exercise Plan  see above    Consulted and Agree with Plan of Care  Patient       Patient will benefit from skilled therapeutic intervention in order to improve the following deficits and impairments:  Pain, Postural dysfunction, Decreased strength, Difficulty walking, Abnormal gait, Decreased activity tolerance, Decreased endurance, Decreased scar mobility, Decreased mobility, Decreased range of motion, Hypomobility, Impaired flexibility, Increased muscle spasms, Impaired sensation, Improper body mechanics  Visit Diagnosis: Muscle weakness (generalized)  Other abnormalities of gait and mobility  Chronic midline low back pain, unspecified whether sciatica present     Problem List Patient Active Problem List   Diagnosis Date Noted  . Spondylolisthesis at L5-S1 level 11/14/2018  . Essential hypertension 03/30/2018  . CN (constipation) 08/15/2014  . Elevated blood sugar 08/15/2014  . GERD (gastroesophageal reflux disease) 08/15/2014  . External hemorrhoid 08/15/2014  . Hypercholesteremia 08/15/2014  . Hypotestosteronism 08/15/2014  . Cardiac murmur 08/15/2014  . Awareness of heartbeats 08/15/2014  . Avitaminosis D 08/15/2014   MJanna Arch PT, DPT   02/23/2019, 12:04 PM  [place]MAIN [facility] IncSERVICES [address] [place] [address]Phone: [phone]  Fax:  [phone] Name: Eric HUGEMRN: [id number] of Birth: [birth year]

## 2019-02-27 ENCOUNTER — Ambulatory Visit: Payer: Commercial Managed Care - PPO

## 2019-02-28 ENCOUNTER — Ambulatory Visit: Payer: Commercial Managed Care - PPO | Attending: Internal Medicine

## 2019-02-28 DIAGNOSIS — Z20822 Contact with and (suspected) exposure to covid-19: Secondary | ICD-10-CM

## 2019-03-01 ENCOUNTER — Ambulatory Visit: Payer: Commercial Managed Care - PPO

## 2019-03-01 LAB — NOVEL CORONAVIRUS, NAA: SARS-CoV-2, NAA: NOT DETECTED

## 2019-03-06 ENCOUNTER — Ambulatory Visit: Payer: Commercial Managed Care - PPO | Attending: Neurosurgery

## 2019-03-06 ENCOUNTER — Other Ambulatory Visit: Payer: Self-pay

## 2019-03-06 DIAGNOSIS — R2689 Other abnormalities of gait and mobility: Secondary | ICD-10-CM | POA: Diagnosis present

## 2019-03-06 DIAGNOSIS — M545 Low back pain, unspecified: Secondary | ICD-10-CM

## 2019-03-06 DIAGNOSIS — G8929 Other chronic pain: Secondary | ICD-10-CM | POA: Insufficient documentation

## 2019-03-06 DIAGNOSIS — M6281 Muscle weakness (generalized): Secondary | ICD-10-CM | POA: Insufficient documentation

## 2019-03-06 DIAGNOSIS — M5442 Lumbago with sciatica, left side: Secondary | ICD-10-CM | POA: Insufficient documentation

## 2019-03-06 NOTE — Therapy (Signed)
[place] MAIN [facility] SERVICES [address] [place], [place], [address] Phone: [phone]   Fax:  [phone]  Physical Therapy Treatment  Patient Details  Name: Eric Lane MRN: [id number] Date of Birth: 11-14-1980 Referring Provider (PT): Dr. Saintclair Halsted   Encounter Date: 03/06/2019  PT End of Session - 03/06/19 1735    Visit Number  14    Number of Visits  29    Date for PT Re-Evaluation  04/19/19    Authorization Type  4/10 PN 02/13/19    PT Start Time  1615    PT Stop Time  1701    PT Time Calculation (min)  46 min    Activity Tolerance  Patient tolerated treatment well    Behavior During Therapy  [facility] for tasks assessed/performed       History reviewed. No pertinent past medical history.  Past Surgical History:  Procedure Laterality Date  . EYE SURGERY    . LASIK Bilateral 09/20/2014    There were no vitals filed for this visit.  Subjective Assessment - 03/06/19 1732    Subjective  Patient missed last week due to being sick. Has been compliant with HEP. No falls or LOB    Pertinent History  Patient received surgery on 11/14/18 for grade I spondylolisthesis L5-S1 with severe spinal stenosis bilateral L5 radiculopathies left greater than right, he underwent PLIF L5-S1. Has seen this therapist earlier this year prior to this surgery and COVID-19 outbreak. Per patient no extension, no sit ups, is ok to do planks. Patient presents in back brace, is supposed to be in for 2 months s/p surgery.  Tingling numbness in big toe of right side. Returned back to work Monday. More sore in the middle of the night.    Limitations  Standing;Walking;Sitting;Lifting;House hold activities    How long can you sit comfortably?  20-30 minutes    How long can you stand comfortably?  hasn't stood still for long duration    How long can you walk comfortably?  has walked 3 miles    Diagnostic tests  s/p surgery    Patient Stated Goals  improve gait/strength,  return to biking, golfing, running    Currently in Pain?  Yes    Pain Score  2     Pain Location  Back    Pain Orientation  Lower    Pain Descriptors / Indicators  Aching;Tightness    Pain Type  Chronic pain    Pain Onset  In the past 7 days    Pain Frequency  Intermittent                Supine: Hamstring stretch 60 second holds each LE, 2 sets, popliteal angle hold 60 seconds each LE, 2 sets, piriformis 60 second holds 2 sets each LE   TrA activation pressing into swiss ball with knees and hands 10x 3 second holds TrA activation opp LE/UE raises 10x each side  Against a wall: Modified wall squat: cueing for posterior pelvic tilt against wall, partial squat down hold 3 seconds, return to upright position while maintaining posterior tilt 10x Modified wall pushup, tactile cueing for neutral spinal alignment 10x   Seated: paloff press RTB  15x each side  Quadruped:  Quadruped with UE raises: cueing for pelvic control with TrA activation with UE raise, patient challenged with control with reaching outside of BOS to clap PT's hand.    Quadruped rocking to childpose x 5 for low back and  core stretching.       Prone: STM to lumbar paraspinals with implements of effleurage and ptrissage for reduction of muscle tissue tension and trigger point release to lateral trunk musculature x 12 minutes.      Pt educated throughout session about proper posture and technique with exercises. Improved exercise technique, movement at target joints, use of target muscles after min to mod verbal, visual, tactile cues.                  PT Education - 03/06/19 1734    Education Details  exercise technique, body mechanics, manual    Person(s) Educated  Patient    Methods  Explanation;Demonstration;Tactile cues;Verbal cues    Comprehension  Verbalized understanding;Returned demonstration;Verbal cues required;Tactile cues required       PT Short Term Goals - 02/23/19 1157       PT SHORT TERM GOAL #1   Title  Pt will be independent with HEP in order to improve strength and decrease back pain in order to improve pain-free function at home and work.     Baseline  10/29: given 12/14: HEP compliant    Time  4    Period  Weeks    Status  Achieved    Target Date  01/26/19        PT Long Term Goals - 02/23/19 1158      PT LONG TERM GOAL #1   Title  Patient will reduce modified Oswestry score to <20 as to demonstrate minimal disability with ADLs including improved sleeping tolerance, walking/sitting tolerance etc for better mobility with ADLs.    Baseline  10/29: 36% 12/14: 30 %    Time  8    Period  Weeks    Status  Partially Met    Target Date  04/19/19      PT LONG TERM GOAL #2   Title  Patient will increase BLE gross strength to 4+/5 as to improve functional strength for independent gait, increased standing tolerance and increased ADL ability.    Baseline  10/29: LLE grossly 3+/5 RLE 4-/5 12/14: LLE hip flex, adduct, and extension 4-/5, abduction and knees 4/5 RLE 4/5    Time  8    Period  Weeks    Status  Partially Met    Target Date  04/19/19      PT LONG TERM GOAL #3   Title  Patient (< 71 years old) will complete five times sit to stand test in < 10 seconds indicating an increased LE strength and improved balance.    Baseline  10/29: 13 seconds 12/14: 9 seconds    Time  8    Period  Weeks    Status  Achieved      PT LONG TERM GOAL #4   Title  Patient will report average VAS of 1/10 to improve tolerance with ADLs and reduced symptoms with activities.    Baseline  10/29: 3/10 12/14: 3/10    Time  8    Period  Weeks    Status  On-going    Target Date  04/19/19      PT LONG TERM GOAL #5   Title  Patient will tolerate gym program within protocol weight limit including: spin bike, weight program, and cardio to return to PLOF.    Baseline  12/24: has not started gym program yet at this time    Time  8    Period  Weeks    Status  New  Target  Date  04/19/19            Plan - 03/06/19 1739    Clinical Impression Statement  Patient presents to physical therapy after missing a week due to illness. He continues to progress with core stability and strengthening with decreasing need for external cueing. Progressive strengthening tolerated. Patient will benefit from skilled physical therapy to increase strength, mobility, and decrease pain levels for improved level of function and quality of life    Personal Factors and Comorbidities  Comorbidity 2    Comorbidities  HTN, GERD, hypercholesteremia, hypotestosteronism, cardiac murmur,    Examination-Activity Limitations  Bed Mobility;Bathing;Bend;Caring for Others;Carry;Hygiene/Grooming;Stairs;Squat;Reach Overhead;Locomotion Level;Lift;Stand;Toileting;Transfers    Examination-Participation Restrictions  Church;Cleaning;Community Activity;Interpersonal Relationship;Laundry;Volunteer;Shop;Meal Prep;Yard Work    [employer] officer  Evolving/Moderate complexity    Rehab Potential  Good    PT Frequency  2x / week    PT Duration  8 weeks    PT Treatment/Interventions  ADLs/Self Care Home Management;Biofeedback;Aquatic Therapy;Canalith Repostioning;Cryotherapy;Electrical Stimulation;Iontophoresis 41m/ml Dexamethasone;Moist Heat;Traction;Ultrasound;DME Instruction;Gait training;Stair training;Functional mobility training;Therapeutic activities;Therapeutic exercise;Balance training;Neuromuscular re-education;Patient/family education;Manual techniques;Passive range of motion;Dry needling;Energy conservation;Taping;Vestibular;Scar mobilization;Compression bandaging    PT Next Visit Plan  bike    PT Home Exercise Plan  see above    Consulted and Agree with Plan of Care  Patient       Patient will benefit from skilled therapeutic intervention in order to improve the following deficits and impairments:  Pain, Postural dysfunction, Decreased strength, Difficulty walking, Abnormal gait,  Decreased activity tolerance, Decreased endurance, Decreased scar mobility, Decreased mobility, Decreased range of motion, Hypomobility, Impaired flexibility, Increased muscle spasms, Impaired sensation, Improper body mechanics  Visit Diagnosis: Muscle weakness (generalized)  Other abnormalities of gait and mobility  Chronic midline low back pain, unspecified whether sciatica present     Problem List Patient Active Problem List   Diagnosis Date Noted  . Spondylolisthesis at L5-S1 level 11/14/2018  . Essential hypertension 03/30/2018  . CN (constipation) 08/15/2014  . Elevated blood sugar 08/15/2014  . GERD (gastroesophageal reflux disease) 08/15/2014  . External hemorrhoid 08/15/2014  . Hypercholesteremia 08/15/2014  . Hypotestosteronism 08/15/2014  . Cardiac murmur 08/15/2014  . Awareness of heartbeats 08/15/2014  . Avitaminosis D 08/15/2014   MJanna Arch PT, DPT   03/06/2019, 5:41 PM  CC[place]MAIN [facility] LLCSERVICES [address] [address]Phone: [phone]  Fax:  [phone] Name: JKAIDIN BOEHLEMRN: [id number] of Birth: [birth year]

## 2019-03-08 ENCOUNTER — Other Ambulatory Visit: Payer: Self-pay

## 2019-03-08 ENCOUNTER — Ambulatory Visit: Payer: Commercial Managed Care - PPO

## 2019-03-08 DIAGNOSIS — M6281 Muscle weakness (generalized): Secondary | ICD-10-CM

## 2019-03-08 DIAGNOSIS — G8929 Other chronic pain: Secondary | ICD-10-CM

## 2019-03-08 DIAGNOSIS — M545 Low back pain, unspecified: Secondary | ICD-10-CM

## 2019-03-08 DIAGNOSIS — R2689 Other abnormalities of gait and mobility: Secondary | ICD-10-CM

## 2019-03-08 NOTE — Therapy (Signed)
[place] MAIN [facility] SERVICES [address] [place], [place], [address] Phone: [phone]   Fax:  [phone]  Physical Therapy Treatment  Patient Details  Name: Eric Lane MRN: [id number] Date of Birth: [birth year] Referring Provider (PT): Dr. Saintclair Halsted   Encounter Date: 03/08/2019  PT End of Session - 03/09/19 1255    Visit Number  15    Number of Visits  29    Date for PT Re-Evaluation  04/19/19    Authorization Type  5/10 PN 02/13/19    PT Start Time  1647    PT Stop Time  1730    PT Time Calculation (min)  43 min    Activity Tolerance  Patient tolerated treatment well    Behavior During Therapy  [facility] for tasks assessed/performed       History reviewed. No pertinent past medical history.  Past Surgical History:  Procedure Laterality Date  . EYE SURGERY    . LASIK Bilateral 09/20/2014    There were no vitals filed for this visit.  Subjective Assessment - 03/09/19 1243    Subjective  Patient reports compliance with HEP. No falls or LOB. Slight tightness or LOB    Pertinent History  Patient received surgery on 11/14/18 for grade I spondylolisthesis L5-S1 with severe spinal stenosis bilateral L5 radiculopathies left greater than right, he underwent PLIF L5-S1. Has seen this therapist earlier this year prior to this surgery and COVID-19 outbreak. Per patient no extension, no sit ups, is ok to do planks. Patient presents in back brace, is supposed to be in for 2 months s/p surgery.  Tingling numbness in big toe of right side. Returned back to work Monday. More sore in the middle of the night.    Limitations  Standing;Walking;Sitting;Lifting;House hold activities    How long can you sit comfortably?  20-30 minutes    How long can you stand comfortably?  hasn't stood still for long duration    How long can you walk comfortably?  has walked 3 miles    Diagnostic tests  s/p surgery    Patient Stated Goals  improve gait/strength, return to  biking, golfing, running    Currently in Pain?  No/denies    Pain Onset  --          Supine: Hamstring stretch 60 second holds each LE, 2 sets, popliteal angle hold 60 seconds each LE, 2 sets, piriformis 60 second holds 2 sets each LE  Bridge; 10x 3 second holds  Bridge: modified single limb bridge 10x   Open prayer stretch 8x each direction to introduce gentle upper thoracic rotation   Scapular retraction punch to ceiling for upper core stability    Quadruped:  Quadruped with LE raises: cueing for pelvic control with TrA activation with LE raise, patient challenged with control with retuning to quadruped x10  Quadruped rocking to childpose x 5 for low back and core stretching.   Quadruped birddog x 10; very challenging.   TRX squat 10x with chair for guidance of depth,   TRX modified posterior lunge with focus on core contraction and single LE stabilization   Prone: STM to lumbar paraspinals with implements of effleurage and ptrissage for reduction of muscle tissue tension and trigger point release to lateral trunk musculature x 10 minutes.       Pt educated throughout session about proper posture and technique with exercises. Improved exercise technique, movement at target joints, use of target muscles after min to mod verbal,  visual, tactile cues.                       PT Education - 03/09/19 1245    Education Details  exercise technique, body mechanics    Person(s) Educated  Patient    Methods  Explanation;Demonstration;Tactile cues;Verbal cues    Comprehension  Verbalized understanding;Returned demonstration;Verbal cues required;Tactile cues required       PT Short Term Goals - 02/23/19 1157      PT SHORT TERM GOAL #1   Title  Pt will be independent with HEP in order to improve strength and decrease back pain in order to improve pain-free function at home and work.     Baseline  10/29: given 12/14: HEP compliant    Time  4    Period  Weeks     Status  Achieved    Target Date  01/26/19        PT Long Term Goals - 02/23/19 1158      PT LONG TERM GOAL #1   Title  Patient will reduce modified Oswestry score to <20 as to demonstrate minimal disability with ADLs including improved sleeping tolerance, walking/sitting tolerance etc for better mobility with ADLs.    Baseline  10/29: 36% 12/14: 30 %    Time  8    Period  Weeks    Status  Partially Met    Target Date  04/19/19      PT LONG TERM GOAL #2   Title  Patient will increase BLE gross strength to 4+/5 as to improve functional strength for independent gait, increased standing tolerance and increased ADL ability.    Baseline  10/29: LLE grossly 3+/5 RLE 4-/5 12/14: LLE hip flex, adduct, and extension 4-/5, abduction and knees 4/5 RLE 4/5    Time  8    Period  Weeks    Status  Partially Met    Target Date  04/19/19      PT LONG TERM GOAL #3   Title  Patient (< 72 years old) will complete five times sit to stand test in < 10 seconds indicating an increased LE strength and improved balance.    Baseline  10/29: 13 seconds 12/14: 9 seconds    Time  8    Period  Weeks    Status  Achieved      PT LONG TERM GOAL #4   Title  Patient will report average VAS of 1/10 to improve tolerance with ADLs and reduced symptoms with activities.    Baseline  10/29: 3/10 12/14: 3/10    Time  8    Period  Weeks    Status  On-going    Target Date  04/19/19      PT LONG TERM GOAL #5   Title  Patient will tolerate gym program within protocol weight limit including: spin bike, weight program, and cardio to return to PLOF.    Baseline  12/24: has not started gym program yet at this time    Time  8    Period  Weeks    Status  New    Target Date  04/19/19            Plan - 03/09/19 1258    Clinical Impression Statement  Patient continues to progress with functional strength and mobility with use of TRX strap and birddog stability. Prolonged core contraction is limited with patient  fatiguing quickly. Patient will benefit from skilled physical therapy to increase  strength, mobility, and decrease pain levels for improved level of function and quality of life    Personal Factors and Comorbidities  Comorbidity 2    Comorbidities  HTN, GERD, hypercholesteremia, hypotestosteronism, cardiac murmur,    Examination-Activity Limitations  Bed Mobility;Bathing;Bend;Caring for Others;Carry;Hygiene/Grooming;Stairs;Squat;Reach Overhead;Locomotion Level;Lift;Stand;Toileting;Transfers    Examination-Participation Restrictions  Church;Cleaning;Community Activity;Interpersonal Relationship;Laundry;Volunteer;Shop;Meal Prep;Yard Work    [employer]  Evolving/Moderate complexity    Rehab Potential  Good    PT Frequency  2x / week    PT Duration  8 weeks    PT Treatment/Interventions  ADLs/Self Care Home Management;Biofeedback;Aquatic Therapy;Canalith Repostioning;Cryotherapy;Electrical Stimulation;Iontophoresis 61m/ml Dexamethasone;Moist Heat;Traction;Ultrasound;DME Instruction;Gait training;Stair training;Functional mobility training;Therapeutic activities;Therapeutic exercise;Balance training;Neuromuscular re-education;Patient/family education;Manual techniques;Passive range of motion;Dry needling;Energy conservation;Taping;Vestibular;Scar mobilization;Compression bandaging    PT Next Visit Plan  bike    PT Home Exercise Plan  see above    Consulted and Agree with Plan of Care  Patient       Patient will benefit from skilled therapeutic intervention in order to improve the following deficits and impairments:  Pain, Postural dysfunction, Decreased strength, Difficulty walking, Abnormal gait, Decreased activity tolerance, Decreased endurance, Decreased scar mobility, Decreased mobility, Decreased range of motion, Hypomobility, Impaired flexibility, Increased muscle spasms, Impaired sensation, Improper body mechanics  Visit Diagnosis: Muscle weakness (generalized)  Other  abnormalities of gait and mobility  Chronic midline low back pain, unspecified whether sciatica present     Problem List Patient Active Problem List   Diagnosis Date Noted  . Spondylolisthesis at L5-S1 level 11/14/2018  . Essential hypertension 03/30/2018  . CN (constipation) 08/15/2014  . Elevated blood sugar 08/15/2014  . GERD (gastroesophageal reflux disease) 08/15/2014  . External hemorrhoid 08/15/2014  . Hypercholesteremia 08/15/2014  . Hypotestosteronism 08/15/2014  . Cardiac murmur 08/15/2014  . Awareness of heartbeats 08/15/2014  . Avitaminosis D 08/15/2014   MJanna Arch PT, DPT   03/09/2019, 12:59 PM  [place] [facility] [address] [address]Phone: [phone]  Fax:  [phone] Name: Eric BEGGSMRN: [id number] of Birth: [birth year]

## 2019-03-20 ENCOUNTER — Other Ambulatory Visit: Payer: Self-pay

## 2019-03-20 ENCOUNTER — Encounter: Payer: Self-pay | Admitting: Physical Therapy

## 2019-03-20 ENCOUNTER — Ambulatory Visit: Payer: Commercial Managed Care - PPO | Admitting: Physical Therapy

## 2019-03-20 DIAGNOSIS — G8929 Other chronic pain: Secondary | ICD-10-CM

## 2019-03-20 DIAGNOSIS — M6281 Muscle weakness (generalized): Secondary | ICD-10-CM | POA: Diagnosis not present

## 2019-03-20 DIAGNOSIS — M5442 Lumbago with sciatica, left side: Secondary | ICD-10-CM

## 2019-03-20 DIAGNOSIS — R2689 Other abnormalities of gait and mobility: Secondary | ICD-10-CM

## 2019-03-20 NOTE — Therapy (Signed)
[place] MAIN [facility] SERVICES [address] [place], [place], [address] Phone: [phone]   Fax:  [phone]  Physical Therapy Treatment  Patient Details  Name: Eric Lane MRN: [id number] Date of Birth: [birth year] Referring Provider (PT): Dr. Saintclair Halsted   Encounter Date: 03/20/2019  PT End of Session - 03/20/19 1643    Visit Number  16    Number of Visits  29    Date for PT Re-Evaluation  04/19/19    PT Start Time  6546    PT Stop Time  5035    PT Time Calculation (min)  40 min       History reviewed. No pertinent past medical history.  Past Surgical History:  Procedure Laterality Date  . EYE SURGERY    . LASIK Bilateral 09/20/2014    There were no vitals filed for this visit.  Subjective Assessment - 03/20/19 1645    Subjective  Patient reports compliance with HEP. No falls or LOB. Slight tightness or LOB    Currently in Pain?  Yes    Pain Score  2     Pain Location  Back    Pain Orientation  Lower    Pain Descriptors / Indicators  Aching    Pain Type  Chronic pain                 Supine: Hamstring stretch 30 second holds each LE, 3 sets, popliteal angle hold 60 seconds each LE, 2 sets  Scapular retraction punch to ceiling for upper core stability   Quadruped:  Quadrupedwith LE raises: cueing for pelvic control with TrA activation withLE raise, patient challenged with control with retuning to quadruped x10  TRX squat 10x with chair for guidance of depth,   TRX modified posterior lunge with focus on core contraction and single LE stabilization  Marching in hooklying and TA x 20    Prone: STM with roller stick  to lumbar paraspinals  for reduction of muscle tissue tension and trigger point release to lateral trunk musculature x110mnutes.  Pt educated throughout session about proper posture and technique with exercises. Improved exercise technique, movement at target joints, use of target muscles  after min to mod verbal, visual, tactile cues.  Pain following treatment 1/10               PT Education - 03/20/19 1643    Education Details  hep    Person(s) Educated  Patient    Methods  Explanation;Verbal cues       PT Short Term Goals - 02/23/19 1157      PT SHORT TERM GOAL #1   Title  Pt will be independent with HEP in order to improve strength and decrease back pain in order to improve pain-free function at home and work.     Baseline  10/29: given 12/14: HEP compliant    Time  4    Period  Weeks    Status  Achieved    Target Date  01/26/19        PT Long Term Goals - 02/23/19 1158      PT LONG TERM GOAL #1   Title  Patient will reduce modified Oswestry score to <20 as to demonstrate minimal disability with ADLs including improved sleeping tolerance, walking/sitting tolerance etc for better mobility with ADLs.    Baseline  10/29: 36% 12/14: 30 %    Time  8    Period  Weeks    Status  Partially Met    Target Date  04/19/19      PT LONG TERM GOAL #2   Title  Patient will increase BLE gross strength to 4+/5 as to improve functional strength for independent gait, increased standing tolerance and increased ADL ability.    Baseline  10/29: LLE grossly 3+/5 RLE 4-/5 12/14: LLE hip flex, adduct, and extension 4-/5, abduction and knees 4/5 RLE 4/5    Time  8    Period  Weeks    Status  Partially Met    Target Date  04/19/19      PT LONG TERM GOAL #3   Title  Patient (< 71 years old) will complete five times sit to stand test in < 10 seconds indicating an increased LE strength and improved balance.    Baseline  10/29: 13 seconds 12/14: 9 seconds    Time  8    Period  Weeks    Status  Achieved      PT LONG TERM GOAL #4   Title  Patient will report average VAS of 1/10 to improve tolerance with ADLs and reduced symptoms with activities.    Baseline  10/29: 3/10 12/14: 3/10    Time  8    Period  Weeks    Status  On-going    Target Date  04/19/19      PT  LONG TERM GOAL #5   Title  Patient will tolerate gym program within protocol weight limit including: spin bike, weight program, and cardio to return to PLOF.    Baseline  12/24: has not started gym program yet at this time    Time  8    Period  Weeks    Status  New    Target Date  04/19/19            Plan - 03/20/19 1643    Clinical Impression Statement  Patient demonstrated decrease in low back pain after performing exercises  indicating improved lumbar AROM and motor control. Performed mobility based exercises to improve lumbar extension to decrease pain. Patient continues to have pain at end of session but was significantly decreased. Patient demonstrates improved ease with ambulation at end of session. Patient will benefit from further skilled therapy focused on improving back pain and strengthening to decrease fall risk.   Personal Factors and Comorbidities  Comorbidity 2    Comorbidities  HTN, GERD, hypercholesteremia, hypotestosteronism, cardiac murmur,    Examination-Activity Limitations  Bed Mobility;Bathing;Bend;Caring for Others;Carry;Hygiene/Grooming;Stairs;Squat;Reach Overhead;Locomotion Level;Lift;Stand;Toileting;Transfers    Examination-Participation Restrictions  Church;Cleaning;Community Activity;Interpersonal Relationship;Laundry;Volunteer;Shop;Meal Prep;Yard Work    [employer] officer  Evolving/Moderate complexity    Rehab Potential  Good    PT Frequency  2x / week    PT Duration  8 weeks    PT Treatment/Interventions  ADLs/Self Care Home Management;Biofeedback;Aquatic Therapy;Canalith Repostioning;Cryotherapy;Electrical Stimulation;Iontophoresis '4mg'$ /ml Dexamethasone;Moist Heat;Traction;Ultrasound;DME Instruction;Gait training;Stair training;Functional mobility training;Therapeutic activities;Therapeutic exercise;Balance training;Neuromuscular re-education;Patient/family education;Manual techniques;Passive range of motion;Dry needling;Energy  conservation;Taping;Vestibular;Scar mobilization;Compression bandaging    PT Next Visit Plan  bike    PT Home Exercise Plan  see above    Consulted and Agree with Plan of Care  Patient       Patient will benefit from skilled therapeutic intervention in order to improve the following deficits and impairments:  Pain, Postural dysfunction, Decreased strength, Difficulty walking, Abnormal gait, Decreased activity tolerance, Decreased endurance, Decreased scar mobility, Decreased mobility, Decreased range of motion, Hypomobility, Impaired flexibility, Increased muscle spasms, Impaired sensation, Improper body mechanics  Visit Diagnosis: Other  abnormalities of gait and mobility  Muscle weakness (generalized)  Chronic midline low back pain, unspecified whether sciatica present  Chronic left-sided low back pain with left-sided sciatica     Problem List Patient Active Problem List   Diagnosis Date Noted  . Spondylolisthesis at L5-S1 level 11/14/2018  . Essential hypertension 03/30/2018  . CN (constipation) 08/15/2014  . Elevated blood sugar 08/15/2014  . GERD (gastroesophageal reflux disease) 08/15/2014  . External hemorrhoid 08/15/2014  . Hypercholesteremia 08/15/2014  . Hypotestosteronism 08/15/2014  . Cardiac murmur 08/15/2014  . Awareness of heartbeats 08/15/2014  . Avitaminosis D 08/15/2014    Alanson Puls, PT DPT 03/20/2019, 5:30 PM  [place] MAIN [facility] SERVICES [address] [place], [place], [address] Phone: [phone]   Fax:  [phone]  Name: Eric Lane MRN: [id number] Date of Birth: 14-Oct-1980

## 2019-03-22 ENCOUNTER — Other Ambulatory Visit: Payer: Self-pay

## 2019-03-22 ENCOUNTER — Ambulatory Visit: Payer: Commercial Managed Care - PPO

## 2019-03-22 DIAGNOSIS — M6281 Muscle weakness (generalized): Secondary | ICD-10-CM | POA: Diagnosis not present

## 2019-03-22 DIAGNOSIS — G8929 Other chronic pain: Secondary | ICD-10-CM

## 2019-03-22 NOTE — Therapy (Signed)
[place] MAIN [facility] SERVICES [address] [place], [place], [address] Phone: [phone]   Fax:  [phone]  Physical Therapy Treatment  Patient Details  Name: Eric Lane MRN: [id number] Date of Birth: 1980/04/02 Referring Provider (PT): Dr. Saintclair Halsted   Encounter Date: 03/22/2019  PT End of Session - 03/22/19 1654    Visit Number  17    Number of Visits  29    Date for PT Re-Evaluation  04/19/19    Authorization Type  6/10 PN 02/13/19    PT Start Time  1648    PT Stop Time  1735    PT Time Calculation (min)  47 min    Activity Tolerance  Patient tolerated treatment well    Behavior During Therapy  [facility] for tasks assessed/performed       History reviewed. No pertinent past medical history.  Past Surgical History:  Procedure Laterality Date  . EYE SURGERY    . LASIK Bilateral 09/20/2014    There were no vitals filed for this visit.  Subjective Assessment - 03/22/19 1700    Subjective  Patient reports no falls or LOB since last session. Reports no recent changes except for starting back at the gym with adequate back support.    Pertinent History  Patient received surgery on 11/14/18 for grade I spondylolisthesis L5-S1 with severe spinal stenosis bilateral L5 radiculopathies left greater than right, he underwent PLIF L5-S1. Has seen this therapist earlier this year prior to this surgery and COVID-19 outbreak. Per patient no extension, no sit ups, is ok to do planks. Patient presents in back brace, is supposed to be in for 2 months s/p surgery.  Tingling numbness in big toe of right side. Returned back to work Monday. More sore in the middle of the night.    Limitations  Standing;Walking;Sitting;Lifting;House hold activities    How long can you sit comfortably?  20-30 minutes    How long can you stand comfortably?  hasn't stood still for long duration    How long can you walk comfortably?  has walked 3 miles    Diagnostic tests  s/p surgery     Patient Stated Goals  improve gait/strength, return to biking, golfing, running    Currently in Pain?  Yes    Pain Score  1     Pain Location  Back    Pain Orientation  Lower    Pain Descriptors / Indicators  Aching    Pain Type  Chronic pain    Pain Onset  --      Manual Therapy: for increased mobility and decreased muscle tension Supine: -Soft tissue massage including ptrissage, effleurage techniques, minimal trigger pointing performed this session.  Therapeutic Exercise:  Supine: -Pelvic tilts, 10x 3-5 sec holds -Marches, 10x each LE, cueing for core activation prior to hip flexion, pt able to correct properly -SLR 5x with pelvic tilts and TrA activation -progressed to modified deadbug (one foot down) 5x with TrA activation -Bridges 10x, cueing for glute contraction prior to bridging -Staggered stance bridges, 10x each LE forward -Hamstring stretch, 30 sec hold 2x each LE, decreased mobility -Popliteal angle stretch, 30 sec hold 2x each LE -Figure 4 stretch knee towards opposite shoulder, 30 sec hold 2x each LE -Child pose stretch, 5x with 3 sec hold -Modified plank, 3x 15 sec holds, cueing for breathing -Modified bird-dogs (without raising arms), 12x each LE, tactile cueing required to prevent pelvic rotation Pt reported having 1/10 lower back pain  at time of session and had decreased mobility, but tolerated exercises well.  Pt continues to benefit from core activation cueing throughout exercises to prevent increased back pain and strengthen proper musculature. Pt was educated on connection between hamstring tightness and pelvic tilt/back pain. He is progressing well with strength and body mechanics. Pt would continue to benefit from skilled PT intervention in order to increase mobility and strength in order to decrease pain and increase quality of life.    PT Education - 03/22/19 1715    Education Details  exercise technique, body mechanics    Person(s) Educated  Patient     Methods  Explanation;Tactile cues;Verbal cues    Comprehension  Verbalized understanding;Returned demonstration;Tactile cues required;Verbal cues required       PT Short Term Goals - 02/23/19 1157      PT SHORT TERM GOAL #1   Title  Pt will be independent with HEP in order to improve strength and decrease back pain in order to improve pain-free function at home and work.     Baseline  10/29: given 12/14: HEP compliant    Time  4    Period  Weeks    Status  Achieved    Target Date  01/26/19        PT Long Term Goals - 02/23/19 1158      PT LONG TERM GOAL #1   Title  Patient will reduce modified Oswestry score to <20 as to demonstrate minimal disability with ADLs including improved sleeping tolerance, walking/sitting tolerance etc for better mobility with ADLs.    Baseline  10/29: 36% 12/14: 30 %    Time  8    Period  Weeks    Status  Partially Met    Target Date  04/19/19      PT LONG TERM GOAL #2   Title  Patient will increase BLE gross strength to 4+/5 as to improve functional strength for independent gait, increased standing tolerance and increased ADL ability.    Baseline  10/29: LLE grossly 3+/5 RLE 4-/5 12/14: LLE hip flex, adduct, and extension 4-/5, abduction and knees 4/5 RLE 4/5    Time  8    Period  Weeks    Status  Partially Met    Target Date  04/19/19      PT LONG TERM GOAL #3   Title  Patient (< 16 years old) will complete five times sit to stand test in < 10 seconds indicating an increased LE strength and improved balance.    Baseline  10/29: 13 seconds 12/14: 9 seconds    Time  8    Period  Weeks    Status  Achieved      PT LONG TERM GOAL #4   Title  Patient will report average VAS of 1/10 to improve tolerance with ADLs and reduced symptoms with activities.    Baseline  10/29: 3/10 12/14: 3/10    Time  8    Period  Weeks    Status  On-going    Target Date  04/19/19      PT LONG TERM GOAL #5   Title  Patient will tolerate gym program within  protocol weight limit including: spin bike, weight program, and cardio to return to PLOF.    Baseline  12/24: has not started gym program yet at this time    Time  8    Period  Weeks    Status  New    Target Date  04/19/19  Plan - 03/22/19 1732    Clinical Impression Statement  Pt reported having 1/10 lower back pain at time of session and had decreased mobility, but tolerated exercises well.Pt continues to benefit from core activation cueing throughout exercises to prevent increased back pain and strengthen proper musculature. Pt was educated on connection between hamstring tightness and pelvic tilt/back pain. He is progressing well with strength and body mechanics. Pt would continue to benefit from skilled PT intervention in order to increase mobility and strength in order to decrease pain and increase quality of life.    Personal Factors and Comorbidities  Comorbidity 2    Comorbidities  HTN, GERD, hypercholesteremia, hypotestosteronism, cardiac murmur,    Examination-Activity Limitations  Bed Mobility;Bathing;Bend;Caring for Others;Carry;Hygiene/Grooming;Stairs;Squat;Reach Overhead;Locomotion Level;Lift;Stand;Toileting;Transfers    Examination-Participation Restrictions  Church;Cleaning;Community Activity;Interpersonal Relationship;Laundry;Volunteer;Shop;Meal Prep;Yard Work    [employer] officer  Evolving/Moderate complexity    Rehab Potential  Good    PT Frequency  2x / week    PT Duration  8 weeks    PT Treatment/Interventions  ADLs/Self Care Home Management;Biofeedback;Aquatic Therapy;Canalith Repostioning;Cryotherapy;Electrical Stimulation;Iontophoresis 30m/ml Dexamethasone;Moist Heat;Traction;Ultrasound;DME Instruction;Gait training;Stair training;Functional mobility training;Therapeutic activities;Therapeutic exercise;Balance training;Neuromuscular re-education;Patient/family education;Manual techniques;Passive range of motion;Dry needling;Energy  conservation;Taping;Vestibular;Scar mobilization;Compression bandaging    PT Next Visit Plan  bike    PT Home Exercise Plan  see above    Consulted and Agree with Plan of Care  Patient       Patient will benefit from skilled therapeutic intervention in order to improve the following deficits and impairments:  Pain, Postural dysfunction, Decreased strength, Difficulty walking, Abnormal gait, Decreased activity tolerance, Decreased endurance, Decreased scar mobility, Decreased mobility, Decreased range of motion, Hypomobility, Impaired flexibility, Increased muscle spasms, Impaired sensation, Improper body mechanics  Visit Diagnosis: Muscle weakness (generalized)  Chronic left-sided low back pain with left-sided sciatica  Chronic midline low back pain, unspecified whether sciatica present     Problem List Patient Active Problem List   Diagnosis Date Noted  . Spondylolisthesis at L5-S1 level 11/14/2018  . Essential hypertension 03/30/2018  . CN (constipation) 08/15/2014  . Elevated blood sugar 08/15/2014  . GERD (gastroesophageal reflux disease) 08/15/2014  . External hemorrhoid 08/15/2014  . Hypercholesteremia 08/15/2014  . Hypotestosteronism 08/15/2014  . Cardiac murmur 08/15/2014  . Awareness of heartbeats 08/15/2014  . Avitaminosis D 08/15/2014    JFlorinda Marker1/20/2021, 5:40 PM  [place] [facility]SERV[facility] [address] [address]Phone: [phone]  Fax:  [phone] Name: JJIMMY PLESSINGERMRN: [id number] of Birth: 1July 14, 1982

## 2019-03-27 ENCOUNTER — Other Ambulatory Visit: Payer: Self-pay

## 2019-03-27 ENCOUNTER — Ambulatory Visit: Payer: Commercial Managed Care - PPO

## 2019-03-27 DIAGNOSIS — M6281 Muscle weakness (generalized): Secondary | ICD-10-CM

## 2019-03-27 DIAGNOSIS — R2689 Other abnormalities of gait and mobility: Secondary | ICD-10-CM

## 2019-03-27 DIAGNOSIS — M545 Low back pain, unspecified: Secondary | ICD-10-CM

## 2019-03-27 DIAGNOSIS — G8929 Other chronic pain: Secondary | ICD-10-CM

## 2019-03-28 NOTE — Therapy (Signed)
[place] MAIN [facility] SERVICES [address] [place], [place], [address] Phone: [phone]   Fax:  [phone]  Physical Therapy Treatment  Patient Details  Name: Eric Lane MRN: [id number] Date of Birth: June 18, 1980 Referring Provider (PT): Dr. Saintclair Halsted   Encounter Date: 03/27/2019  PT End of Session - 03/28/19 1219    Visit Number  18    Number of Visits  29    Date for PT Re-Evaluation  04/19/19    Authorization Type  8/10 PN 02/13/19    PT Start Time  1644    PT Stop Time  1731    PT Time Calculation (min)  47 min    Activity Tolerance  Patient tolerated treatment well    Behavior During Therapy  [facility] for tasks assessed/performed       History reviewed. No pertinent past medical history.  Past Surgical History:  Procedure Laterality Date  . EYE SURGERY    . LASIK Bilateral 09/20/2014    There were no vitals filed for this visit.  Subjective Assessment - 03/28/19 1217    Subjective  Patient reports he has gone to the gym a few times. When he rode his upright bike last he experienced some nerve impingement syndromes that passed within 5 minutes. No falls or injury since last session.    Pertinent History  Patient received surgery on 11/14/18 for grade I spondylolisthesis L5-S1 with severe spinal stenosis bilateral L5 radiculopathies left greater than right, he underwent PLIF L5-S1. Has seen this therapist earlier this year prior to this surgery and COVID-19 outbreak. Per patient no extension, no sit ups, is ok to do planks. Patient presents in back brace, is supposed to be in for 2 months s/p surgery.  Tingling numbness in big toe of right side. Returned back to work Monday. More sore in the middle of the night.    Limitations  Standing;Walking;Sitting;Lifting;House hold activities    How long can you sit comfortably?  20-30 minutes    How long can you stand comfortably?  hasn't stood still for long duration    How long can you walk  comfortably?  has walked 3 miles    Diagnostic tests  s/p surgery    Patient Stated Goals  improve gait/strength, return to biking, golfing, running    Currently in Pain?  Yes    Pain Score  1     Pain Location  Back    Pain Orientation  Lower    Pain Descriptors / Indicators  Aching    Pain Type  Chronic pain           Treatment:  Manual Hamstring stretch supine 60 second hold limited length LLE>RLE, popliteal angle bilaterally 60 second, piriformis stretch 60 second holds  Mobilizations to thoracic with grade II-III CPAs due to limited mobility of thoracic spine resulting in excessive kyphosis   TherEx: Supine: Single LE bridge: very challenging requires cueing for posterior pelvic tilt, gluteal squeeze and reduction of compensatory patterning LE rotation for introduction of gentle rotation with back stabilized for progression to eventual return to sport. 60 seconds  Seated on swiss ball: TrA activation with marches 10x each LE  TrA activation with LAQ 10x each LE BTB rows 15x with cueing for neutral spinal alignment  Seated: Open window arms at 90 90 for pectoral stretch and postural correction 15x  Quadruped: Fire hydrants with tactile cueing for core activation 10x each LE childpose to quadruped stretch 20 second holds with  increasing range  Prone: Scapular retractions with arms at 90 90  Roller to piriformis L and R: L increased tightness compared to previous sessions  Patient educated on safe body mechanics with gym interventions and biking.        Pt educated throughout session about proper posture and technique with exercises. Improved exercise technique, movement at target joints, use of target muscles after min to mod verbal, visual, tactile cues; Tolerated session well reporting less back pain at end of session compared to start of session                 PT Education - 03/28/19 1218    Education Details  exercise technique, body  mechanics, positioning on spin bike    Person(s) Educated  Patient    Methods  Explanation;Demonstration;Tactile cues;Verbal cues    Comprehension  Verbalized understanding;Returned demonstration;Verbal cues required;Tactile cues required       PT Short Term Goals - 02/23/19 1157      PT SHORT TERM GOAL #1   Title  Pt will be independent with HEP in order to improve strength and decrease back pain in order to improve pain-free function at home and work.     Baseline  10/29: given 12/14: HEP compliant    Time  4    Period  Weeks    Status  Achieved    Target Date  01/26/19        PT Long Term Goals - 02/23/19 1158      PT LONG TERM GOAL #1   Title  Patient will reduce modified Oswestry score to <20 as to demonstrate minimal disability with ADLs including improved sleeping tolerance, walking/sitting tolerance etc for better mobility with ADLs.    Baseline  10/29: 36% 12/14: 30 %    Time  8    Period  Weeks    Status  Partially Met    Target Date  04/19/19      PT LONG TERM GOAL #2   Title  Patient will increase BLE gross strength to 4+/5 as to improve functional strength for independent gait, increased standing tolerance and increased ADL ability.    Baseline  10/29: LLE grossly 3+/5 RLE 4-/5 12/14: LLE hip flex, adduct, and extension 4-/5, abduction and knees 4/5 RLE 4/5    Time  8    Period  Weeks    Status  Partially Met    Target Date  04/19/19      PT LONG TERM GOAL #3   Title  Patient (< 55 years old) will complete five times sit to stand test in < 10 seconds indicating an increased LE strength and improved balance.    Baseline  10/29: 13 seconds 12/14: 9 seconds    Time  8    Period  Weeks    Status  Achieved      PT LONG TERM GOAL #4   Title  Patient will report average VAS of 1/10 to improve tolerance with ADLs and reduced symptoms with activities.    Baseline  10/29: 3/10 12/14: 3/10    Time  8    Period  Weeks    Status  On-going    Target Date  04/19/19       PT LONG TERM GOAL #5   Title  Patient will tolerate gym program within protocol weight limit including: spin bike, weight program, and cardio to return to PLOF.    Baseline  12/24: has not started gym program yet  at this time    Time  8    Period  Weeks    Status  New    Target Date  04/19/19            Plan - 03/28/19 1220    Clinical Impression Statement  Patient presents to physical therapy with excellent motivation. Progression of core stability and postural correction tasks performed with patient verbalizing fatigue but decreased pain by end of session. Patient initially presented with increased kyphosis with R cervical side bend potentially due to increased stressors in work environment. Use of manual and therex allowed for reduction of postural dysfunction and reduced strain on low back allowing for improved gait mechanics and mobility. Pt would continue to benefit from skilled PT intervention in order to increase mobility and strength in order to decrease pain and increase quality of life    Personal Factors and Comorbidities  Comorbidity 2    Comorbidities  HTN, GERD, hypercholesteremia, hypotestosteronism, cardiac murmur,    Examination-Activity Limitations  Bed Mobility;Bathing;Bend;Caring for Others;Carry;Hygiene/Grooming;Stairs;Squat;Reach Overhead;Locomotion Level;Lift;Stand;Toileting;Transfers    Examination-Participation Restrictions  Church;Cleaning;Community Activity;Interpersonal Relationship;Laundry;Volunteer;Shop;Meal Prep;Yard Work    [employer] officer  Evolving/Moderate complexity    Rehab Potential  Good    PT Frequency  2x / week    PT Duration  8 weeks    PT Treatment/Interventions  ADLs/Self Care Home Management;Biofeedback;Aquatic Therapy;Canalith Repostioning;Cryotherapy;Electrical Stimulation;Iontophoresis 46m/ml Dexamethasone;Moist Heat;Traction;Ultrasound;DME Instruction;Gait training;Stair training;Functional mobility  training;Therapeutic activities;Therapeutic exercise;Balance training;Neuromuscular re-education;Patient/family education;Manual techniques;Passive range of motion;Dry needling;Energy conservation;Taping;Vestibular;Scar mobilization;Compression bandaging    PT Next Visit Plan  bike    PT Home Exercise Plan  see above    Consulted and Agree with Plan of Care  Patient       Patient will benefit from skilled therapeutic intervention in order to improve the following deficits and impairments:  Pain, Postural dysfunction, Decreased strength, Difficulty walking, Abnormal gait, Decreased activity tolerance, Decreased endurance, Decreased scar mobility, Decreased mobility, Decreased range of motion, Hypomobility, Impaired flexibility, Increased muscle spasms, Impaired sensation, Improper body mechanics  Visit Diagnosis: Muscle weakness (generalized)  Chronic left-sided low back pain with left-sided sciatica  Chronic midline low back pain, unspecified whether sciatica present  Other abnormalities of gait and mobility     Problem List Patient Active Problem List   Diagnosis Date Noted  . Spondylolisthesis at L5-S1 level 11/14/2018  . Essential hypertension 03/30/2018  . CN (constipation) 08/15/2014  . Elevated blood sugar 08/15/2014  . GERD (gastroesophageal reflux disease) 08/15/2014  . External hemorrhoid 08/15/2014  . Hypercholesteremia 08/15/2014  . Hypotestosteronism 08/15/2014  . Cardiac murmur 08/15/2014  . Awareness of heartbeats 08/15/2014  . Avitaminosis D 08/15/2014   MJanna Arch PT, DPT   03/28/2019, 12:21 PM  CFords [place]MAIN RWellington [facility] CenterSERVICES [address] [address]Phone: [phone]  Fax:  [phone] Name: Eric PERRELLMRN: [id number] of Birth: 107-07-1980

## 2019-03-29 ENCOUNTER — Other Ambulatory Visit: Payer: Self-pay

## 2019-03-29 ENCOUNTER — Ambulatory Visit: Payer: Commercial Managed Care - PPO

## 2019-03-29 DIAGNOSIS — M6281 Muscle weakness (generalized): Secondary | ICD-10-CM

## 2019-03-29 DIAGNOSIS — R2689 Other abnormalities of gait and mobility: Secondary | ICD-10-CM

## 2019-03-29 DIAGNOSIS — G8929 Other chronic pain: Secondary | ICD-10-CM

## 2019-03-29 NOTE — Therapy (Signed)
[place] MAIN [facility] SERVICES [address] [place], [place], [address] Phone: [phone]   Fax:  [phone]  Physical Therapy Treatment  Patient Details  Name: Eric Lane MRN: [id number] Date of Birth: 1981-02-18 Referring Provider (PT): Dr. Saintclair Halsted   Encounter Date: 03/29/2019  PT End of Session - 03/30/19 1129    Visit Number  19    Number of Visits  29    Date for PT Re-Evaluation  04/19/19    Authorization Type  9/10 PN 02/13/19    PT Start Time  1645    PT Stop Time  1732    PT Time Calculation (min)  47 min    Activity Tolerance  Patient tolerated treatment well    Behavior During Therapy  [facility] for tasks assessed/performed       History reviewed. No pertinent past medical history.  Past Surgical History:  Procedure Laterality Date  . EYE SURGERY    . LASIK Bilateral 09/20/2014    There were no vitals filed for this visit.  Subjective Assessment - 03/30/19 1120    Subjective  Patient reports no return of "twinging" symptoms. Has been compliant with HEP.    Pertinent History  Patient received surgery on 11/14/18 for grade I spondylolisthesis L5-S1 with severe spinal stenosis bilateral L5 radiculopathies left greater than right, he underwent PLIF L5-S1. Has seen this therapist earlier this year prior to this surgery and COVID-19 outbreak. Per patient no extension, no sit ups, is ok to do planks. Patient presents in back brace, is supposed to be in for 2 months s/p surgery.  Tingling numbness in big toe of right side. Returned back to work Monday. More sore in the middle of the night.    Limitations  Standing;Walking;Sitting;Lifting;House hold activities    How long can you sit comfortably?  20-30 minutes    How long can you stand comfortably?  hasn't stood still for long duration    How long can you walk comfortably?  has walked 3 miles    Diagnostic tests  s/p surgery    Patient Stated Goals  improve gait/strength, return to  biking, golfing, running    Currently in Pain?  No/denies         Treatment:  Spin bike assessment: noted rotation of right posterior lumbar motion due to excessive propulsion with R>L  3 minutes level 8  Proper lifting technique education with 25 lb dumbell from chair and from floor, education on widening BOS for decreased torsion on spine. Patient demonstrated understanding.   Supine:  Hamstring stretch 60 seconds x 2 trials each LE,   progression of "dead bug exercise" LE only, UE only, cross body UE/LE raises   seated: Seated on swiss ball: BTB Paloff press 10x each side, cueing for core stabilization  Standing  side wall plank with modifications for stability 30 seconds each side  Mare Ferrari carry: arm down with scap retraction, other arm at 90 90. 10lb and 5lb dumbells respectively 40 ft x 2 each position. Tactile assistance for positioning.   Prone: Roller to piriformis L and R: L increased tightness compared to previous sessions 3 minutes   STM to lumbar paraspinals with implements of effleurage and ptrissage for reduction of muscle tissue tension and trigger point release to lateral trunk musculature x 9 minutes.    Pt educated throughout session about proper posture and technique with exercises. Improved exercise technique, movement at target joints, use of target muscles after min to mod  verbal, visual, tactile cues.             PT Education - 03/30/19 1127    Education Details  exercise technique, body mechanics, lifting technique, spine bike posture    Person(s) Educated  Patient    Methods  Explanation;Demonstration;Tactile cues;Verbal cues    Comprehension  Returned demonstration;Verbalized understanding;Verbal cues required;Tactile cues required       PT Short Term Goals - 02/23/19 1157      PT SHORT TERM GOAL #1   Title  Pt will be independent with HEP in order to improve strength and decrease back pain in order to improve pain-free function at  home and work.     Baseline  10/29: given 12/14: HEP compliant    Time  4    Period  Weeks    Status  Achieved    Target Date  01/26/19        PT Long Term Goals - 02/23/19 1158      PT LONG TERM GOAL #1   Title  Patient will reduce modified Oswestry score to <20 as to demonstrate minimal disability with ADLs including improved sleeping tolerance, walking/sitting tolerance etc for better mobility with ADLs.    Baseline  10/29: 36% 12/14: 30 %    Time  8    Period  Weeks    Status  Partially Met    Target Date  04/19/19      PT LONG TERM GOAL #2   Title  Patient will increase BLE gross strength to 4+/5 as to improve functional strength for independent gait, increased standing tolerance and increased ADL ability.    Baseline  10/29: LLE grossly 3+/5 RLE 4-/5 12/14: LLE hip flex, adduct, and extension 4-/5, abduction and knees 4/5 RLE 4/5    Time  8    Period  Weeks    Status  Partially Met    Target Date  04/19/19      PT LONG TERM GOAL #3   Title  Patient (< 35 years old) will complete five times sit to stand test in < 10 seconds indicating an increased LE strength and improved balance.    Baseline  10/29: 13 seconds 12/14: 9 seconds    Time  8    Period  Weeks    Status  Achieved      PT LONG TERM GOAL #4   Title  Patient will report average VAS of 1/10 to improve tolerance with ADLs and reduced symptoms with activities.    Baseline  10/29: 3/10 12/14: 3/10    Time  8    Period  Weeks    Status  On-going    Target Date  04/19/19      PT LONG TERM GOAL #5   Title  Patient will tolerate gym program within protocol weight limit including: spin bike, weight program, and cardio to return to PLOF.    Baseline  12/24: has not started gym program yet at this time    Time  8    Period  Weeks    Status  New    Target Date  04/19/19            Plan - 03/30/19 1129    Clinical Impression Statement  Patient is demonstrating good progress with functional mobility with  decreasing episodes of compensatory body mechanics. He continues to be challenged with core activation with prolonged mobility as seen in compensatory mechanics on bicycle requiring cueing for equal weight press  through LE's for reduction of shift onto right side. Pt would continue to benefit from skilled PT intervention in order to increase mobility and strength in order to decrease pain and increase quality of life    Personal Factors and Comorbidities  Comorbidity 2    Comorbidities  HTN, GERD, hypercholesteremia, hypotestosteronism, cardiac murmur,    Examination-Activity Limitations  Bed Mobility;Bathing;Bend;Caring for Others;Carry;Hygiene/Grooming;Stairs;Squat;Reach Overhead;Locomotion Level;Lift;Stand;Toileting;Transfers    Examination-Participation Restrictions  Church;Cleaning;Community Activity;Interpersonal Relationship;Laundry;Volunteer;Shop;Meal Prep;Yard Work    [employer] officer  Evolving/Moderate complexity    Rehab Potential  Good    PT Frequency  2x / week    PT Duration  8 weeks    PT Treatment/Interventions  ADLs/Self Care Home Management;Biofeedback;Aquatic Therapy;Canalith Repostioning;Cryotherapy;Electrical Stimulation;Iontophoresis 11m/ml Dexamethasone;Moist Heat;Traction;Ultrasound;DME Instruction;Gait training;Stair training;Functional mobility training;Therapeutic activities;Therapeutic exercise;Balance training;Neuromuscular re-education;Patient/family education;Manual techniques;Passive range of motion;Dry needling;Energy conservation;Taping;Vestibular;Scar mobilization;Compression bandaging    PT Next Visit Plan  bike    PT Home Exercise Plan  see above    Consulted and Agree with Plan of Care  Patient       Patient will benefit from skilled therapeutic intervention in order to improve the following deficits and impairments:  Pain, Postural dysfunction, Decreased strength, Difficulty walking, Abnormal gait, Decreased activity tolerance, Decreased  endurance, Decreased scar mobility, Decreased mobility, Decreased range of motion, Hypomobility, Impaired flexibility, Increased muscle spasms, Impaired sensation, Improper body mechanics  Visit Diagnosis: Muscle weakness (generalized)  Chronic left-sided low back pain with left-sided sciatica  Chronic midline low back pain, unspecified whether sciatica present  Other abnormalities of gait and mobility     Problem List Patient Active Problem List   Diagnosis Date Noted  . Spondylolisthesis at L5-S1 level 11/14/2018  . Essential hypertension 03/30/2018  . CN (constipation) 08/15/2014  . Elevated blood sugar 08/15/2014  . GERD (gastroesophageal reflux disease) 08/15/2014  . External hemorrhoid 08/15/2014  . Hypercholesteremia 08/15/2014  . Hypotestosteronism 08/15/2014  . Cardiac murmur 08/15/2014  . Awareness of heartbeats 08/15/2014  . Avitaminosis D 08/15/2014   MJanna Arch PT, DPT   03/30/2019, 11:31 AM  [place] [facility]SERV[facility] [address] [address]Phone: [phone]  Fax:  [phone] Name: Eric KALASMRN: [id number] of Birth: 125-Jun-1982

## 2019-04-04 ENCOUNTER — Ambulatory Visit: Payer: Commercial Managed Care - PPO | Attending: Neurosurgery

## 2019-04-04 ENCOUNTER — Other Ambulatory Visit: Payer: Self-pay

## 2019-04-04 DIAGNOSIS — M5442 Lumbago with sciatica, left side: Secondary | ICD-10-CM | POA: Insufficient documentation

## 2019-04-04 DIAGNOSIS — M545 Low back pain, unspecified: Secondary | ICD-10-CM

## 2019-04-04 DIAGNOSIS — M6281 Muscle weakness (generalized): Secondary | ICD-10-CM | POA: Insufficient documentation

## 2019-04-04 DIAGNOSIS — R2689 Other abnormalities of gait and mobility: Secondary | ICD-10-CM | POA: Diagnosis present

## 2019-04-04 DIAGNOSIS — G8929 Other chronic pain: Secondary | ICD-10-CM | POA: Insufficient documentation

## 2019-04-04 NOTE — Therapy (Signed)
[place] MAIN [facility] SERVICES [address] [place], [place], [address] Phone: [phone]   Fax:  [phone]  Physical Therapy Treatment Physical Therapy Progress Note   Dates of reporting period  02/13/19  to   04/04/19  Patient Details  Name: Eric Lane MRN: [id number] Date of Birth: Oct 31, 1980 Referring Provider (PT): Dr. Saintclair Halsted   Encounter Date: 04/04/2019  PT End of Session - 04/04/19 1644    Visit Number  20    Number of Visits  29    Date for PT Re-Evaluation  04/19/19    Authorization Type  10/10 PN 02/13/19; next session 1/10 PN 04/04/19    PT Start Time  1632    PT Stop Time  1724    PT Time Calculation (min)  52 min    Activity Tolerance  Patient tolerated treatment well    Behavior During Therapy  [facility] for tasks assessed/performed       History reviewed. No pertinent past medical history.  Past Surgical History:  Procedure Laterality Date  . EYE SURGERY    . LASIK Bilateral 09/20/2014    There were no vitals filed for this visit.  Subjective Assessment - 04/05/19 0851    Subjective  Patient reports compliance with HEP. Has been very stressed at work lately due to recent issues resulting in increased muscle stiffness.    Pertinent History  Patient received surgery on 11/14/18 for grade I spondylolisthesis L5-S1 with severe spinal stenosis bilateral L5 radiculopathies left greater than right, he underwent PLIF L5-S1. Has seen this therapist earlier this year prior to this surgery and COVID-19 outbreak. Per patient no extension, no sit ups, is ok to do planks. Patient presents in back brace, is supposed to be in for 2 months s/p surgery.  Tingling numbness in big toe of right side. Returned back to work Monday. More sore in the middle of the night.    Limitations  Standing;Walking;Sitting;Lifting;House hold activities    How long can you sit comfortably?  20-30 minutes    How long can you stand comfortably?  hasn't stood still  for long duration    How long can you walk comfortably?  has walked 3 miles    Diagnostic tests  s/p surgery    Patient Stated Goals  improve gait/strength, return to biking, golfing, running    Currently in Pain?  Yes    Pain Score  1     Pain Location  Back    Pain Orientation  Lower    Pain Descriptors / Indicators  Aching    Pain Type  Chronic pain        MODI: 20%  BLE strength  Right Left  Hip flexion 4+/5 4+/5  Hip Abduction 4+/5 4/5  Hip Adduction 4/5 4/5  Knee Extension  4+/5 4+/5  Knee Flexion 4/5 4-/5  DF 4+/5 4/5  PF 4+/5 4/5     5x STS: 7 seconds  VAS : 2/10 Gym program: able to do some modified exercises still very limited.   Treat:  Supine:  Hamstring stretch 60 seconds each LE, more limited LLE, improved with repetition and prolonged hold 2x each LE, popliteal angle 60 second holds Piriformis lengthening stretch 60 second holds LLE,  SAD with belt in piriformis stretch with inferior glide 20 seconds x 3 trials.   Sidelying: Side plank on knees 20 seconds each side x 3 trials each side, very challenging   Tall kneeling:  paloff press RTB 10x  each side; challenging   Standing:  Wall squats 8x 10 second holds at full depth  PNF pattern D2/wood chops 10x each side RTB; added to HEP  Prone: Thoracic mobilizations: CPA and UPA grade I-III with hypomobility in R>L improved with repeated mobilizations 4x 20 seconds J mobilization 3x 30 seconds      Patient's condition has the potential to improve in response to therapy. Maximum improvement is yet to be obtained. The anticipated improvement is attainable and reasonable in a generally predictable time.  Patient reports he is having decreased pain daily with constant 1/10 pain. He is able to slowly return to activities he was unable to perform before surgery.                        PT Education - 04/05/19 [phone]    Education Details  goals, exercise technique, body mechanics     Person(s) Educated  Patient    Methods  Explanation;Demonstration;Tactile cues;Verbal cues    Comprehension  Verbalized understanding;Returned demonstration;Verbal cues required;Tactile cues required       PT Short Term Goals - 04/05/19 0902      PT SHORT TERM GOAL #1   Title  Pt will be independent with HEP in order to improve strength and decrease back pain in order to improve pain-free function at home and work.     Baseline  10/29: given 12/14: HEP compliant    Time  4    Period  Weeks    Status  Achieved    Target Date  01/26/19        PT Long Term Goals - 04/05/19 0902      PT LONG TERM GOAL #1   Title  Patient will reduce modified Oswestry score to <20 as to demonstrate minimal disability with ADLs including improved sleeping tolerance, walking/sitting tolerance etc for better mobility with ADLs.    Baseline  10/29: 36% 12/14: 30 % 2/3: 20%    Time  8    Period  Weeks    Status  Partially Met    Target Date  04/19/19      PT LONG TERM GOAL #2   Title  Patient will increase BLE gross strength to 4+/5 as to improve functional strength for independent gait, increased standing tolerance and increased ADL ability.    Baseline  10/29: LLE grossly 3+/5 RLE 4-/5 12/14: LLE hip flex, adduct, and extension 4-/5, abduction and knees 4/5 RLE 4/5 2/3: R 4+/5 L 4/5 with hip flex and extension 4+/5    Time  8    Period  Weeks    Status  Partially Met    Target Date  04/19/19      PT LONG TERM GOAL #3   Title  Patient (< 36 years old) will complete five times sit to stand test in < 10 seconds indicating an increased LE strength and improved balance.    Baseline  10/29: 13 seconds 12/14: 9 seconds    Time  8    Period  Weeks    Status  Achieved      PT LONG TERM GOAL #4   Title  Patient will report average VAS of 1/10 to improve tolerance with ADLs and reduced symptoms with activities.    Baseline  10/29: 3/10 12/14: 3/10 2/3: 2/10    Time  8    Period  Weeks    Status   Partially Met    Target Date  04/19/19  PT LONG TERM GOAL #5   Title  Patient will tolerate gym program within protocol weight limit including: spin bike, weight program, and cardio to return to PLOF.    Baseline  12/24: has not started gym program yet at this time 2/2: able to do some modified exercises still very limited.    Time  8    Period  Weeks    Status  Partially Met    Target Date  04/19/19            Plan - 04/05/19 0909    Clinical Impression Statement  Patient demonstrates progress towards functional goals with decreased pain and improved mobility. He has began to return to a modified gym program however is challenged due to limited stability of core at this time as well as prolonged muscle recruitment fatigue. Increased hypomobility of thoracic spine noted this session due to work stressors resulting in increased guarding of post surgical sight. Patient's condition has the potential to improve in response to therapy. Maximum improvement is yet to be obtained. The anticipated improvement is attainable and reasonable in a generally predictable time.  Pt would continue to benefit from skilled PT intervention in order to increase mobility and strength in order to decrease pain and increase quality of life    Personal Factors and Comorbidities  Comorbidity 2    Comorbidities  HTN, GERD, hypercholesteremia, hypotestosteronism, cardiac murmur,    Examination-Activity Limitations  Bed Mobility;Bathing;Bend;Caring for Others;Carry;Hygiene/Grooming;Stairs;Squat;Reach Overhead;Locomotion Level;Lift;Stand;Toileting;Transfers    Examination-Participation Restrictions  Church;Cleaning;Community Activity;Interpersonal Relationship;Laundry;Volunteer;Shop;Meal Prep;Yard Work    [employer] officer  Evolving/Moderate complexity    Rehab Potential  Good    PT Frequency  2x / week    PT Duration  8 weeks    PT Treatment/Interventions  ADLs/Self Care Home  Management;Biofeedback;Aquatic Therapy;Canalith Repostioning;Cryotherapy;Electrical Stimulation;Iontophoresis 36m/ml Dexamethasone;Moist Heat;Traction;Ultrasound;DME Instruction;Gait training;Stair training;Functional mobility training;Therapeutic activities;Therapeutic exercise;Balance training;Neuromuscular re-education;Patient/family education;Manual techniques;Passive range of motion;Dry needling;Energy conservation;Taping;Vestibular;Scar mobilization;Compression bandaging    PT Next Visit Plan  bike    PT Home Exercise Plan  see above    Consulted and Agree with Plan of Care  Patient       Patient will benefit from skilled therapeutic intervention in order to improve the following deficits and impairments:  Pain, Postural dysfunction, Decreased strength, Difficulty walking, Abnormal gait, Decreased activity tolerance, Decreased endurance, Decreased scar mobility, Decreased mobility, Decreased range of motion, Hypomobility, Impaired flexibility, Increased muscle spasms, Impaired sensation, Improper body mechanics  Visit Diagnosis: Muscle weakness (generalized)  Chronic left-sided low back pain with left-sided sciatica  Chronic midline low back pain, unspecified whether sciatica present  Other abnormalities of gait and mobility     Problem List Patient Active Problem List   Diagnosis Date Noted  . Spondylolisthesis at L5-S1 level 11/14/2018  . Essential hypertension 03/30/2018  . CN (constipation) 08/15/2014  . Elevated blood sugar 08/15/2014  . GERD (gastroesophageal reflux disease) 08/15/2014  . External hemorrhoid 08/15/2014  . Hypercholesteremia 08/15/2014  . Hypotestosteronism 08/15/2014  . Cardiac murmur 08/15/2014  . Awareness of heartbeats 08/15/2014  . Avitaminosis D 08/15/2014   MJanna Arch PT, DPT   04/05/2019, 9:10 AM  [place] [facility]Del [facility] HospitalSERVICES [address] [address]Phone: [phone]  Fax:   [phone] Name: Eric FLAVELLMRN: [id number] of Birth: 1December 04, 1982

## 2019-04-06 ENCOUNTER — Ambulatory Visit: Payer: Commercial Managed Care - PPO

## 2019-04-06 ENCOUNTER — Other Ambulatory Visit: Payer: Self-pay

## 2019-04-06 DIAGNOSIS — M5442 Lumbago with sciatica, left side: Secondary | ICD-10-CM

## 2019-04-06 DIAGNOSIS — M6281 Muscle weakness (generalized): Secondary | ICD-10-CM

## 2019-04-06 DIAGNOSIS — R2689 Other abnormalities of gait and mobility: Secondary | ICD-10-CM

## 2019-04-06 DIAGNOSIS — G8929 Other chronic pain: Secondary | ICD-10-CM

## 2019-04-06 NOTE — Therapy (Signed)
[place] MAIN [facility] SERVICES [address] [place], [place], [address] Phone: [phone]   Fax:  [phone]  Physical Therapy Treatment  Patient Details  Name: Eric Lane MRN: [id number] Date of Birth: 07/14/1980 Referring Provider (PT): Dr. Saintclair Halsted   Encounter Date: 04/06/2019  PT End of Session - 04/06/19 1639    Visit Number  21    Number of Visits  29    Date for PT Re-Evaluation  04/19/19    Authorization Type  1/10 PN 04/04/19    PT Start Time  1646    PT Stop Time  1731    PT Time Calculation (min)  45 min    Activity Tolerance  Patient tolerated treatment well    Behavior During Therapy  [facility] for tasks assessed/performed       History reviewed. No pertinent past medical history.  Past Surgical History:  Procedure Laterality Date  . EYE SURGERY    . LASIK Bilateral 09/20/2014    There were no vitals filed for this visit.  Subjective Assessment - 04/07/19 0756    Subjective  Patient presents with wife and theragun for education on proper use at home. Patient reports no falls or LOB, has been compliant with HEP.    Pertinent History  Patient received surgery on 11/14/18 for grade I spondylolisthesis L5-S1 with severe spinal stenosis bilateral L5 radiculopathies left greater than right, he underwent PLIF L5-S1. Has seen this therapist earlier this year prior to this surgery and COVID-19 outbreak. Per patient no extension, no sit ups, is ok to do planks. Patient presents in back brace, is supposed to be in for 2 months s/p surgery.  Tingling numbness in big toe of right side. Returned back to work Monday. More sore in the middle of the night.    Limitations  Standing;Walking;Sitting;Lifting;House hold activities    How long can you sit comfortably?  20-30 minutes    How long can you stand comfortably?  hasn't stood still for long duration    How long can you walk comfortably?  has walked 3 miles    Diagnostic tests  s/p surgery     Patient Stated Goals  improve gait/strength, return to biking, golfing, running    Currently in Pain?  Yes    Pain Score  1     Pain Location  Back    Pain Orientation  Lower    Pain Descriptors / Indicators  Aching    Pain Type  Chronic pain    Pain Onset  More than a month ago    Pain Frequency  Constant          Theragun education to patient and wife for reduction of muscle tissue tension. Targeting: quadratus lumborum, prifiromis, IT band, hamstrings.     Supine:  Hamstring stretch 60 seconds each LE, more limited LLE, improved with repetition and prolonged hold 2x each LE, popliteal angle 60 second holds Piriformis lengthening stretch 60 second holds LLE,  Bridges with adduction ball squeeze 10x   Half kneeling 3lb bar wood chops 10x each direction, tactile cueing for coordination of abdominal recruitment  standing: Pushups against wall with increasing tension on core and lat musculature, 10x, cueing for pelvic alignment for neutral position 8lb dumbbell on step: modified deadlift, cueing for pressing through heels, posterior tilt of pelvis for alignment and trunk position 12x   Prone: Thoracic mobilizations: CPA and UPA grade I-III with hypomobility in R>L improved with repeated mobilizations 4x 20  seconds J mobilization 3x 30 seconds   STM to lumbar paraspinals with implements of effleurage and ptrissage for reduction of muscle tissue tension and trigger point release to lateral trunk musculature [id number].     Pt educated throughout session about proper posture and technique with exercises. Improved exercise technique, movement at target joints, use of target muscles after min to mod verbal, visual, tactile cues.      PT Education - 04/06/19 1639    Education Details  exercise technique, theragun usage at home for reduction of muscle tissue tension    Person(s) Educated  Patient    Methods  Explanation;Demonstration;Tactile cues;Verbal cues    Comprehension   Verbalized understanding;Returned demonstration;Verbal cues required;Tactile cues required       PT Short Term Goals - 04/05/19 0902      PT SHORT TERM GOAL #1   Title  Pt will be independent with HEP in order to improve strength and decrease back pain in order to improve pain-free function at home and work.     Baseline  10/29: given 12/14: HEP compliant    Time  4    Period  Weeks    Status  Achieved    Target Date  01/26/19        PT Long Term Goals - 04/05/19 0902      PT LONG TERM GOAL #1   Title  Patient will reduce modified Oswestry score to <20 as to demonstrate minimal disability with ADLs including improved sleeping tolerance, walking/sitting tolerance etc for better mobility with ADLs.    Baseline  10/29: 36% 12/14: 30 % 2/3: 20%    Time  8    Period  Weeks    Status  Partially Met    Target Date  04/19/19      PT LONG TERM GOAL #2   Title  Patient will increase BLE gross strength to 4+/5 as to improve functional strength for independent gait, increased standing tolerance and increased ADL ability.    Baseline  10/29: LLE grossly 3+/5 RLE 4-/5 12/14: LLE hip flex, adduct, and extension 4-/5, abduction and knees 4/5 RLE 4/5 2/3: R 4+/5 L 4/5 with hip flex and extension 4+/5    Time  8    Period  Weeks    Status  Partially Met    Target Date  04/19/19      PT LONG TERM GOAL #3   Title  Patient (< 89years old) will complete five times sit to stand test in < 10 seconds indicating an increased LE strength and improved balance.    Baseline  10/29: 13 seconds 12/14: 9 seconds    Time  8    Period  Weeks    Status  Achieved      PT LONG TERM GOAL #4   Title  Patient will report average VAS of 1/10 to improve tolerance with ADLs and reduced symptoms with activities.    Baseline  10/29: 3/10 12/14: 3/10 2/3: 2/10    Time  8    Period  Weeks    Status  Partially Met    Target Date  04/19/19      PT LONG TERM GOAL #5   Title  Patient will tolerate gym program within  protocol weight limit including: spin bike, weight program, and cardio to return to PLOF.    Baseline  12/24: has not started gym program yet at this time 2/2: able to do some modified exercises still very limited.  Time  8    Period  Weeks    Status  Partially Met    Target Date  04/19/19            Plan - 04/07/19 0759    Clinical Impression Statement  Patient and patient's wife educated on proper use of theragun for home use. Demonstrated/verbalized understanding. Patient continues to progress with functional strengthening and stability of core and trunk for return to sport/golf. Patient's upper spinal levels including lower cervical and upper thoracic continue to be limited in mobility resulting in guarding of lumbar surgical site. Pt would continue to benefit from skilled PT intervention in order to increase mobility and strength in order to decrease pain and increase quality of life    Personal Factors and Comorbidities  Comorbidity 2    Comorbidities  HTN, GERD, hypercholesteremia, hypotestosteronism, cardiac murmur,    Examination-Activity Limitations  Bed Mobility;Bathing;Bend;Caring for Others;Carry;Hygiene/Grooming;Stairs;Squat;Reach Overhead;Locomotion Level;Lift;Stand;Toileting;Transfers    Examination-Participation Restrictions  Church;Cleaning;Community Activity;Interpersonal Relationship;Laundry;Volunteer;Shop;Meal Prep;Yard Work    [employer] officer  Evolving/Moderate complexity    Rehab Potential  Good    PT Frequency  2x / week    PT Duration  8 weeks    PT Treatment/Interventions  ADLs/Self Care Home Management;Biofeedback;Aquatic Therapy;Canalith Repostioning;Cryotherapy;Electrical Stimulation;Iontophoresis 35m/ml Dexamethasone;Moist Heat;Traction;Ultrasound;DME Instruction;Gait training;Stair training;Functional mobility training;Therapeutic activities;Therapeutic exercise;Balance training;Neuromuscular re-education;Patient/family education;Manual  techniques;Passive range of motion;Dry needling;Energy conservation;Taping;Vestibular;Scar mobilization;Compression bandaging    PT Next Visit Plan  bike    PT Home Exercise Plan  see above    Consulted and Agree with Plan of Care  Patient       Patient will benefit from skilled therapeutic intervention in order to improve the following deficits and impairments:  Pain, Postural dysfunction, Decreased strength, Difficulty walking, Abnormal gait, Decreased activity tolerance, Decreased endurance, Decreased scar mobility, Decreased mobility, Decreased range of motion, Hypomobility, Impaired flexibility, Increased muscle spasms, Impaired sensation, Improper body mechanics  Visit Diagnosis: Muscle weakness (generalized)  Chronic left-sided low back pain with left-sided sciatica  Other abnormalities of gait and mobility     Problem List Patient Active Problem List   Diagnosis Date Noted  . Spondylolisthesis at L5-S1 level 11/14/2018  . Essential hypertension 03/30/2018  . CN (constipation) 08/15/2014  . Elevated blood sugar 08/15/2014  . GERD (gastroesophageal reflux disease) 08/15/2014  . External hemorrhoid 08/15/2014  . Hypercholesteremia 08/15/2014  . Hypotestosteronism 08/15/2014  . Cardiac murmur 08/15/2014  . Awareness of heartbeats 08/15/2014  . Avitaminosis D 08/15/2014   MJanna Arch PT, DPT   04/07/2019, 8:00 AM  [place]MAIN [facility] HospitalSERVICES [address]RPears[address] [place] [address]Phone: [phone]  Fax:  [phone] Name: JJSHON IBEMRN: [id number] of Birth: 106-04-1980

## 2019-04-11 ENCOUNTER — Other Ambulatory Visit: Payer: Self-pay

## 2019-04-11 ENCOUNTER — Ambulatory Visit: Payer: Commercial Managed Care - PPO

## 2019-04-11 DIAGNOSIS — G8929 Other chronic pain: Secondary | ICD-10-CM

## 2019-04-11 DIAGNOSIS — M6281 Muscle weakness (generalized): Secondary | ICD-10-CM

## 2019-04-11 DIAGNOSIS — R2689 Other abnormalities of gait and mobility: Secondary | ICD-10-CM

## 2019-04-11 NOTE — Therapy (Signed)
[place] MAIN [facility] SERVICES [address] [place], [place], [address] Phone: [phone]   Fax:  [phone]  Physical Therapy Treatment  Patient Details  Name: Eric Lane MRN: [id number] Date of Birth: [birth year] Referring Provider (PT): Dr. Saintclair Halsted   Encounter Date: 04/11/2019  PT End of Session - 04/12/19 0843    Visit Number  22    Number of Visits  29    Date for PT Re-Evaluation  04/19/19    Authorization Type  2/10 PN 04/04/19    PT Start Time  1647    PT Stop Time  1729    PT Time Calculation (min)  42 min    Activity Tolerance  Patient tolerated treatment well    Behavior During Therapy  [facility] for tasks assessed/performed       History reviewed. No pertinent past medical history.  Past Surgical History:  Procedure Laterality Date  . EYE SURGERY    . LASIK Bilateral 09/20/2014    There were no vitals filed for this visit.  Subjective Assessment - 04/12/19 0831    Subjective  Patient reports compliance with HEP but has been very stressed at work causing increased tension of upper back. No falls or acute pain episodes    Pertinent History  Patient received surgery on 11/14/18 for grade I spondylolisthesis L5-S1 with severe spinal stenosis bilateral L5 radiculopathies left greater than right, he underwent PLIF L5-S1. Has seen this therapist earlier this year prior to this surgery and COVID-19 outbreak. Per patient no extension, no sit ups, is ok to do planks. Patient presents in back brace, is supposed to be in for 2 months s/p surgery.  Tingling numbness in big toe of right side. Returned back to work Monday. More sore in the middle of the night.    Limitations  Standing;Walking;Sitting;Lifting;House hold activities    How long can you sit comfortably?  20-30 minutes    How long can you stand comfortably?  hasn't stood still for long duration    How long can you walk comfortably?  has walked 3 miles    Diagnostic tests  s/p surgery     Patient Stated Goals  improve gait/strength, return to biking, golfing, running    Currently in Pain?  Yes    Pain Score  1     Pain Location  Back    Pain Orientation  Lower    Pain Descriptors / Indicators  Aching    Pain Type  Chronic pain    Pain Onset  More than a month ago    Pain Frequency  Constant            Supine:  Hamstring stretch60 seconds each LE, more limited LLE, improved with repetition and prolonged hold 2x each LE, popliteal angle 60 second holds Piriformis lengthening stretch 60 second holds LLE,  Bridges with bosu ball under feet; blue side up 12x; arms crossed for gluteal activation  Half kneeling Modified open prayer in half kneeling: twist and open arm 10x each direction, tactile cueing for coordination of abdominal recruitment  Quadruped: bosu ball : flat side up: shoulders over elbows over hands grasping edge of bosu, lateral tilts with core contraction; focus on keeping trunk straight without tilt. 15x  bosu ball; flat side up: shoulders over elbows over hands, forward/backward shift into/out of childpose with focus on core contraction and stabilization of trunk 15x   Prone: Thoracic mobilizations: CPA and UPA grade I-III with hypomobility in R>L  improved with repeated mobilizations 4x 20 seconds J mobilization 3x 30 seconds     Pt educated throughout session about proper posture and technique with exercises. Improved exercise technique, movement at target joints, use of target muscles after min to mod verbal, visual, tactile cues.                 PT Education - 04/12/19 [phone]    Education Details  exercise technique, body mechanics, core stabilization    Person(s) Educated  Patient    Methods  Explanation;Demonstration;Tactile cues;Verbal cues    Comprehension  Verbalized understanding;Returned demonstration;Verbal cues required;Tactile cues required       PT Short Term Goals - 04/05/19 0902      PT SHORT TERM GOAL #1    Title  Pt will be independent with HEP in order to improve strength and decrease back pain in order to improve pain-free function at home and work.     Baseline  10/29: given 12/14: HEP compliant    Time  4    Period  Weeks    Status  Achieved    Target Date  01/26/19        PT Long Term Goals - 04/05/19 0902      PT LONG TERM GOAL #1   Title  Patient will reduce modified Oswestry score to <20 as to demonstrate minimal disability with ADLs including improved sleeping tolerance, walking/sitting tolerance etc for better mobility with ADLs.    Baseline  10/29: 36% 12/14: 30 % 2/3: 20%    Time  8    Period  Weeks    Status  Partially Met    Target Date  04/19/19      PT LONG TERM GOAL #2   Title  Patient will increase BLE gross strength to 4+/5 as to improve functional strength for independent gait, increased standing tolerance and increased ADL ability.    Baseline  10/29: LLE grossly 3+/5 RLE 4-/5 12/14: LLE hip flex, adduct, and extension 4-/5, abduction and knees 4/5 RLE 4/5 2/3: R 4+/5 L 4/5 with hip flex and extension 4+/5    Time  8    Period  Weeks    Status  Partially Met    Target Date  04/19/19      PT LONG TERM GOAL #3   Title  Patient (< 52 years old) will complete five times sit to stand test in < 10 seconds indicating an increased LE strength and improved balance.    Baseline  10/29: 13 seconds 12/14: 9 seconds    Time  8    Period  Weeks    Status  Achieved      PT LONG TERM GOAL #4   Title  Patient will report average VAS of 1/10 to improve tolerance with ADLs and reduced symptoms with activities.    Baseline  10/29: 3/10 12/14: 3/10 2/3: 2/10    Time  8    Period  Weeks    Status  Partially Met    Target Date  04/19/19      PT LONG TERM GOAL #5   Title  Patient will tolerate gym program within protocol weight limit including: spin bike, weight program, and cardio to return to PLOF.    Baseline  12/24: has not started gym program yet at this time 2/2: able to  do some modified exercises still very limited.    Time  8    Period  Weeks    Status  Partially Met    Target Date  04/19/19            Plan - 04/12/19 0848    Clinical Impression Statement  Patient presented to physical therapy with excellent motivation. He continues to progress with stability of trunk for progression of introduction of return to sport movements in safe manner. Core stabilization with extremity movement continues to challenge patient however is less fatiguing than previous session. Pt would continue to benefit from skilled PT intervention in order to increase mobility and strength in order to decrease pain and increase quality of life    Personal Factors and Comorbidities  Comorbidity 2    Comorbidities  HTN, GERD, hypercholesteremia, hypotestosteronism, cardiac murmur,    Examination-Activity Limitations  Bed Mobility;Bathing;Bend;Caring for Others;Carry;Hygiene/Grooming;Stairs;Squat;Reach Overhead;Locomotion Level;Lift;Stand;Toileting;Transfers    Examination-Participation Restrictions  Church;Cleaning;Community Activity;Interpersonal Relationship;Laundry;Volunteer;Shop;Meal Prep;Yard Work    [employer] officer  Evolving/Moderate complexity    Rehab Potential  Good    PT Frequency  2x / week    PT Duration  8 weeks    PT Treatment/Interventions  ADLs/Self Care Home Management;Biofeedback;Aquatic Therapy;Canalith Repostioning;Cryotherapy;Electrical Stimulation;Iontophoresis 10m/ml Dexamethasone;Moist Heat;Traction;Ultrasound;DME Instruction;Gait training;Stair training;Functional mobility training;Therapeutic activities;Therapeutic exercise;Balance training;Neuromuscular re-education;Patient/family education;Manual techniques;Passive range of motion;Dry needling;Energy conservation;Taping;Vestibular;Scar mobilization;Compression bandaging    PT Next Visit Plan  bike    PT Home Exercise Plan  see above    Consulted and Agree with Plan of Care  Patient        Patient will benefit from skilled therapeutic intervention in order to improve the following deficits and impairments:  Pain, Postural dysfunction, Decreased strength, Difficulty walking, Abnormal gait, Decreased activity tolerance, Decreased endurance, Decreased scar mobility, Decreased mobility, Decreased range of motion, Hypomobility, Impaired flexibility, Increased muscle spasms, Impaired sensation, Improper body mechanics  Visit Diagnosis: Muscle weakness (generalized)  Chronic left-sided low back pain with left-sided sciatica  Other abnormalities of gait and mobility     Problem List Patient Active Problem List   Diagnosis Date Noted  . Spondylolisthesis at L5-S1 level 11/14/2018  . Essential hypertension 03/30/2018  . CN (constipation) 08/15/2014  . Elevated blood sugar 08/15/2014  . GERD (gastroesophageal reflux disease) 08/15/2014  . External hemorrhoid 08/15/2014  . Hypercholesteremia 08/15/2014  . Hypotestosteronism 08/15/2014  . Cardiac murmur 08/15/2014  . Awareness of heartbeats 08/15/2014  . Avitaminosis D 08/15/2014   MJanna Arch PT, DPT   04/12/2019, 8:52 AM  [place]ip[place] FerryMAIN [facility]ca [facility] HospitalSERVICES [address] [address]Phone: [phone]  Fax:  [phone] Name: Eric DEBOARDMRN: [id number] of Birth: [birth year]

## 2019-04-13 ENCOUNTER — Ambulatory Visit: Payer: Commercial Managed Care - PPO

## 2019-04-13 ENCOUNTER — Other Ambulatory Visit: Payer: Self-pay

## 2019-04-13 DIAGNOSIS — M6281 Muscle weakness (generalized): Secondary | ICD-10-CM

## 2019-04-13 DIAGNOSIS — G8929 Other chronic pain: Secondary | ICD-10-CM

## 2019-04-13 DIAGNOSIS — R2689 Other abnormalities of gait and mobility: Secondary | ICD-10-CM

## 2019-04-13 NOTE — Therapy (Signed)
[place] MAIN [facility] SERVICES [address] [place], [place], [address] Phone: [phone]   Fax:  [phone]  Physical Therapy Treatment  Patient Details  Name: Eric Lane MRN: [id number] Date of Birth: [birth year] Referring Provider (PT): Dr. Saintclair Halsted   Encounter Date: 04/13/2019  PT End of Session - 04/13/19 1633    Visit Number  23    Number of Visits  29    Date for PT Re-Evaluation  04/19/19    Authorization Type  3/10 PN 04/04/19    PT Start Time  1645    PT Stop Time  1729    PT Time Calculation (min)  44 min    Activity Tolerance  Patient tolerated treatment well    Behavior During Therapy  [facility] for tasks assessed/performed       History reviewed. No pertinent past medical history.  Past Surgical History:  Procedure Laterality Date  . EYE SURGERY    . LASIK Bilateral 09/20/2014    There were no vitals filed for this visit.  Subjective Assessment - 04/14/19 0812    Subjective  Patient reports he is very sore from working out but no pain. No LOB or sudden increase in pain. HEP compliant    Pertinent History  Patient received surgery on 11/14/18 for grade I spondylolisthesis L5-S1 with severe spinal stenosis bilateral L5 radiculopathies left greater than right, he underwent PLIF L5-S1. Has seen this therapist earlier this year prior to this surgery and COVID-19 outbreak. Per patient no extension, no sit ups, is ok to do planks. Patient presents in back brace, is supposed to be in for 2 months s/p surgery.  Tingling numbness in big toe of right side. Returned back to work Monday. More sore in the middle of the night.    Limitations  Standing;Walking;Sitting;Lifting;House hold activities    How long can you sit comfortably?  20-30 minutes    How long can you stand comfortably?  hasn't stood still for long duration    How long can you walk comfortably?  has walked 3 miles    Diagnostic tests  s/p surgery    Patient Stated Goals  improve  gait/strength, return to biking, golfing, running    Currently in Pain?  Yes    Pain Score  1     Pain Location  Back    Pain Orientation  Lower    Pain Descriptors / Indicators  Aching    Pain Type  Chronic pain    Pain Onset  More than a month ago    Pain Frequency  Constant          Supine:  Hamstring stretch60 seconds each LE, more limited LLE, improved with repetition and prolonged hold 2x each LE, popliteal angle 60 second holds  Piriformis lengthening stretch 60 second holds LLE,  Posterior pelvic tilts 10x  Standing Standing inside outside taps of foot: single with IR/ER cross body tap; 15x each LE, challenging to perform lateral reach  Posterior lunge with overhead raise of 5lb stick ; 12 x each LE, cueing for hip extension and upright trunk position with core activation to reduce twist.   Standing resisted rotation against cable machine #2.5 10x each side. Cueing for core contraction and knee flexion   Standing paloff press 7.5 # 15x each side.   Med ball underhand throw with squat thrust with yellow weighted ball 15x ; cueing for hip hinge   Half kneeling  Half kneeling rotation with side  bend, wrap a exercise band around legs, place hands behind head and keep trunk upright, slowly rotate to the right and then flex to the right and return to straightened position; 12x each side    Prone: STM with implementation of effleurage and pettrisage for left lumbar paraspinal and lateral musculature. Noted tenderness with muscle adhesions along top of iliac crest. x7 minutes    Pt educated throughout session about proper posture and technique with exercises. Improved exercise technique, movement at target joints, use of target muscles after min to mod verbal, visual, tactile cues.                   PT Education - 04/13/19 1633    Education Details  exercise technique, body mechanics, core stabilization    Person(s) Educated  Patient    Methods   Explanation;Demonstration;Tactile cues;Verbal cues    Comprehension  Verbalized understanding;Returned demonstration;Verbal cues required;Tactile cues required       PT Short Term Goals - 04/05/19 0902      PT SHORT TERM GOAL #1   Title  Pt will be independent with HEP in order to improve strength and decrease back pain in order to improve pain-free function at home and work.     Baseline  10/29: given 12/14: HEP compliant    Time  4    Period  Weeks    Status  Achieved    Target Date  01/26/19        PT Long Term Goals - 04/05/19 0902      PT LONG TERM GOAL #1   Title  Patient will reduce modified Oswestry score to <20 as to demonstrate minimal disability with ADLs including improved sleeping tolerance, walking/sitting tolerance etc for better mobility with ADLs.    Baseline  10/29: 36% 12/14: 30 % 2/3: 20%    Time  8    Period  Weeks    Status  Partially Met    Target Date  04/19/19      PT LONG TERM GOAL #2   Title  Patient will increase BLE gross strength to 4+/5 as to improve functional strength for independent gait, increased standing tolerance and increased ADL ability.    Baseline  10/29: LLE grossly 3+/5 RLE 4-/5 12/14: LLE hip flex, adduct, and extension 4-/5, abduction and knees 4/5 RLE 4/5 2/3: R 4+/5 L 4/5 with hip flex and extension 4+/5    Time  8    Period  Weeks    Status  Partially Met    Target Date  04/19/19      PT LONG TERM GOAL #3   Title  Patient (< 41 years old) will complete five times sit to stand test in < 10 seconds indicating an increased LE strength and improved balance.    Baseline  10/29: 13 seconds 12/14: 9 seconds    Time  8    Period  Weeks    Status  Achieved      PT LONG TERM GOAL #4   Title  Patient will report average VAS of 1/10 to improve tolerance with ADLs and reduced symptoms with activities.    Baseline  10/29: 3/10 12/14: 3/10 2/3: 2/10    Time  8    Period  Weeks    Status  Partially Met    Target Date  04/19/19       PT LONG TERM GOAL #5   Title  Patient will tolerate gym program within protocol weight limit  including: spin bike, weight program, and cardio to return to PLOF.    Baseline  12/24: has not started gym program yet at this time 2/2: able to do some modified exercises still very limited.    Time  8    Period  Weeks    Status  Partially Met    Target Date  04/19/19            Plan - 04/14/19 0825    Clinical Impression Statement  Patient presents to physical therapy with excellent motivation. Progression towards functional return to sport performed with introduction to functional bend and twist with safe body mechanics and core stabilization performed.  Trunk stabilization is challenging with prolonged holds however patient is able to demonstrate safe body mechanics for introduction to golf swing. Pt would continue to benefit from skilled PT intervention in order to increase mobility and strength in order to decrease pain and increase quality of life    Personal Factors and Comorbidities  Comorbidity 2    Comorbidities  HTN, GERD, hypercholesteremia, hypotestosteronism, cardiac murmur,    Examination-Activity Limitations  Bed Mobility;Bathing;Bend;Caring for Others;Carry;Hygiene/Grooming;Stairs;Squat;Reach Overhead;Locomotion Level;Lift;Stand;Toileting;Transfers    Examination-Participation Restrictions  Church;Cleaning;Community Activity;Interpersonal Relationship;Laundry;Volunteer;Shop;Meal Prep;Yard Work    [employer] officer  Evolving/Moderate complexity    Rehab Potential  Good    PT Frequency  2x / week    PT Duration  8 weeks    PT Treatment/Interventions  ADLs/Self Care Home Management;Biofeedback;Aquatic Therapy;Canalith Repostioning;Cryotherapy;Electrical Stimulation;Iontophoresis 73m/ml Dexamethasone;Moist Heat;Traction;Ultrasound;DME Instruction;Gait training;Stair training;Functional mobility training;Therapeutic activities;Therapeutic exercise;Balance  training;Neuromuscular re-education;Patient/family education;Manual techniques;Passive range of motion;Dry needling;Energy conservation;Taping;Vestibular;Scar mobilization;Compression bandaging    PT Next Visit Plan  bike    PT Home Exercise Plan  see above    Consulted and Agree with Plan of Care  Patient       Patient will benefit from skilled therapeutic intervention in order to improve the following deficits and impairments:  Pain, Postural dysfunction, Decreased strength, Difficulty walking, Abnormal gait, Decreased activity tolerance, Decreased endurance, Decreased scar mobility, Decreased mobility, Decreased range of motion, Hypomobility, Impaired flexibility, Increased muscle spasms, Impaired sensation, Improper body mechanics  Visit Diagnosis: Muscle weakness (generalized)  Chronic left-sided low back pain with left-sided sciatica  Other abnormalities of gait and mobility     Problem List Patient Active Problem List   Diagnosis Date Noted  . Spondylolisthesis at L5-S1 level 11/14/2018  . Essential hypertension 03/30/2018  . CN (constipation) 08/15/2014  . Elevated blood sugar 08/15/2014  . GERD (gastroesophageal reflux disease) 08/15/2014  . External hemorrhoid 08/15/2014  . Hypercholesteremia 08/15/2014  . Hypotestosteronism 08/15/2014  . Cardiac murmur 08/15/2014  . Awareness of heartbeats 08/15/2014  . Avitaminosis D 08/15/2014   MJanna Arch PT, DPT   04/14/2019, 8:25 AM  [place] [facility] [address] [address]Phone: [phone]  Fax:  [phone] Name: Eric BUCKLEMRN: [id number] of Birth: 1May 09, 1982

## 2019-04-17 ENCOUNTER — Ambulatory Visit: Payer: Commercial Managed Care - PPO

## 2019-04-17 ENCOUNTER — Other Ambulatory Visit: Payer: Self-pay

## 2019-04-17 DIAGNOSIS — R2689 Other abnormalities of gait and mobility: Secondary | ICD-10-CM

## 2019-04-17 DIAGNOSIS — M6281 Muscle weakness (generalized): Secondary | ICD-10-CM

## 2019-04-17 DIAGNOSIS — G8929 Other chronic pain: Secondary | ICD-10-CM

## 2019-04-17 NOTE — Therapy (Signed)
[place] MAIN [facility] SERVICES [address] [place], [place], [address] Phone: [phone]   Fax:  [phone]  Physical Therapy Treatment  Patient Details  Name: Eric Lane MRN: [id number] Date of Birth: 1980-11-04 Referring Provider (PT): Dr. Saintclair Halsted   Encounter Date: 04/17/2019  PT End of Session - 04/18/19 0825    Visit Number  24    Number of Visits  29    Date for PT Re-Evaluation  04/19/19    Authorization Type  4/10 PN 04/04/19    PT Start Time  1644    PT Stop Time  1728    PT Time Calculation (min)  44 min    Activity Tolerance  Patient tolerated treatment well    Behavior During Therapy  [facility] for tasks assessed/performed       History reviewed. No pertinent past medical history.  Past Surgical History:  Procedure Laterality Date  . EYE SURGERY    . LASIK Bilateral 09/20/2014    There were no vitals filed for this visit.  Subjective Assessment - 04/18/19 0823    Subjective  Patient reports work has been stressful, is having increased tension in upper back. Has been compliant with HEP.    Pertinent History  Patient received surgery on 11/14/18 for grade I spondylolisthesis L5-S1 with severe spinal stenosis bilateral L5 radiculopathies left greater than right, he underwent PLIF L5-S1. Has seen this therapist earlier this year prior to this surgery and COVID-19 outbreak. Per patient no extension, no sit ups, is ok to do planks. Patient presents in back brace, is supposed to be in for 2 months s/p surgery.  Tingling numbness in big toe of right side. Returned back to work Monday. More sore in the middle of the night.    Limitations  Standing;Walking;Sitting;Lifting;House hold activities    How long can you sit comfortably?  20-30 minutes    How long can you stand comfortably?  hasn't stood still for long duration    How long can you walk comfortably?  has walked 3 miles    Diagnostic tests  s/p surgery    Patient Stated Goals   improve gait/strength, return to biking, golfing, running    Currently in Pain?  Yes    Pain Score  1     Pain Location  Back    Pain Orientation  Lower    Pain Descriptors / Indicators  Aching    Pain Type  Chronic pain    Pain Onset  More than a month ago    Pain Frequency  Constant            Supine:  Hamstring stretch 60 seconds each LE, more limited LLE, improved with repetition and prolonged hold 2x each LE, popliteal angle 60 second holds   Piriformis lengthening stretch 60 second holds LLE,      Standing Med ball underhand throw with squat thrust with yellow weighted ball 15x ; cueing for hip hinge    Reproduction of putting for return to sport with putter, golf ball, and makeshift "hole", cueing for core contraction, slight knee flexion, and trunk stabilization for reduction of torsion on post surgical sight x 5 minute  Half kneeling/tall kneeling   Half kneeling rotation with side bend, wrap a exercise band around legs, place hands behind head and keep trunk upright, slowly rotate to the right and then flex to the right and return to straightened position; 12x each side   Sitting on heels; glute/abdominal  squeeze to tall kneeling, open prayer rotation L, R, return to sit; 10x each LE    Quadruped: childpose to quadruped rocking with hold in child pose 12x   Sidelying: Modified side plank 20 seconds x3 trials each LE, requires assistance to obtain neutral position; each side  Prone: Thoracic mobilizations: CPA and UPA grade I-III with hypomobility in R>L improved with repeated mobilizations 4x 20 seconds J mobilization 3x 30 seconds    Pt educated throughout session about proper posture and technique with exercises. Improved exercise technique, movement at target joints, use of target muscles after min to mod verbal, visual, tactile cues                     PT Education - 04/18/19 0824    Education Details  exercise technique, body mechanics,  core stabilization, return to sport    Person(s) Educated  Patient    Methods  Explanation;Demonstration;Tactile cues;Verbal cues    Comprehension  Verbalized understanding;Returned demonstration;Verbal cues required;Tactile cues required       PT Short Term Goals - 04/05/19 0902      PT SHORT TERM GOAL #1   Title  Pt will be independent with HEP in order to improve strength and decrease back pain in order to improve pain-free function at home and work.     Baseline  10/29: given 12/14: HEP compliant    Time  4    Period  Weeks    Status  Achieved    Target Date  01/26/19        PT Long Term Goals - 04/05/19 0902      PT LONG TERM GOAL #1   Title  Patient will reduce modified Oswestry score to <20 as to demonstrate minimal disability with ADLs including improved sleeping tolerance, walking/sitting tolerance etc for better mobility with ADLs.    Baseline  10/29: 36% 12/14: 30 % 2/3: 20%    Time  8    Period  Weeks    Status  Partially Met    Target Date  04/19/19      PT LONG TERM GOAL #2   Title  Patient will increase BLE gross strength to 4+/5 as to improve functional strength for independent gait, increased standing tolerance and increased ADL ability.    Baseline  10/29: LLE grossly 3+/5 RLE 4-/5 12/14: LLE hip flex, adduct, and extension 4-/5, abduction and knees 4/5 RLE 4/5 2/3: R 4+/5 L 4/5 with hip flex and extension 4+/5    Time  8    Period  Weeks    Status  Partially Met    Target Date  04/19/19      PT LONG TERM GOAL #3   Title  Patient (< 38 years old) will complete five times sit to stand test in < 10 seconds indicating an increased LE strength and improved balance.    Baseline  10/29: 13 seconds 12/14: 9 seconds    Time  8    Period  Weeks    Status  Achieved      PT LONG TERM GOAL #4   Title  Patient will report average VAS of 1/10 to improve tolerance with ADLs and reduced symptoms with activities.    Baseline  10/29: 3/10 12/14: 3/10 2/3: 2/10    Time   8    Period  Weeks    Status  Partially Met    Target Date  04/19/19      PT LONG TERM  GOAL #5   Title  Patient will tolerate gym program within protocol weight limit including: spin bike, weight program, and cardio to return to PLOF.    Baseline  12/24: has not started gym program yet at this time 2/2: able to do some modified exercises still very limited.    Time  8    Period  Weeks    Status  Partially Met    Target Date  04/19/19            Plan - 04/18/19 0827    Clinical Impression Statement  Patient continues to progress with return to sport interventions with good motivation and trunk control. He continues to be limited in hamstring length bilaterally creating postural strain on post surgical spinal sight however improves with repeated hold. Side planks can be performed modified however is challenging with patient requiring assistance to obtain neutral positioning. Pt would continue to benefit from skilled PT intervention in order to increase mobility and strength in order to decrease pain and increase quality of life    Personal Factors and Comorbidities  Comorbidity 2    Comorbidities  HTN, GERD, hypercholesteremia, hypotestosteronism, cardiac murmur,    Examination-Activity Limitations  Bed Mobility;Bathing;Bend;Caring for Others;Carry;Hygiene/Grooming;Stairs;Squat;Reach Overhead;Locomotion Level;Lift;Stand;Toileting;Transfers    Examination-Participation Restrictions  Church;Cleaning;Community Activity;Interpersonal Relationship;Laundry;Volunteer;Shop;Meal Prep;Yard Work    [employer] officer  Evolving/Moderate complexity    Rehab Potential  Good    PT Frequency  2x / week    PT Duration  8 weeks    PT Treatment/Interventions  ADLs/Self Care Home Management;Biofeedback;Aquatic Therapy;Canalith Repostioning;Cryotherapy;Electrical Stimulation;Iontophoresis 25m/ml Dexamethasone;Moist Heat;Traction;Ultrasound;DME Instruction;Gait training;Stair  training;Functional mobility training;Therapeutic activities;Therapeutic exercise;Balance training;Neuromuscular re-education;Patient/family education;Manual techniques;Passive range of motion;Dry needling;Energy conservation;Taping;Vestibular;Scar mobilization;Compression bandaging    PT Next Visit Plan  recert    PT Home Exercise Plan  see above    Consulted and Agree with Plan of Care  Patient       Patient will benefit from skilled therapeutic intervention in order to improve the following deficits and impairments:  Pain, Postural dysfunction, Decreased strength, Difficulty walking, Abnormal gait, Decreased activity tolerance, Decreased endurance, Decreased scar mobility, Decreased mobility, Decreased range of motion, Hypomobility, Impaired flexibility, Increased muscle spasms, Impaired sensation, Improper body mechanics  Visit Diagnosis: Muscle weakness (generalized)  Chronic left-sided low back pain with left-sided sciatica  Other abnormalities of gait and mobility     Problem List Patient Active Problem List   Diagnosis Date Noted  . Spondylolisthesis at L5-S1 level 11/14/2018  . Essential hypertension 03/30/2018  . CN (constipation) 08/15/2014  . Elevated blood sugar 08/15/2014  . GERD (gastroesophageal reflux disease) 08/15/2014  . External hemorrhoid 08/15/2014  . Hypercholesteremia 08/15/2014  . Hypotestosteronism 08/15/2014  . Cardiac murmur 08/15/2014  . Awareness of heartbeats 08/15/2014  . Avitaminosis D 08/15/2014   MJanna Arch PT, DPT   04/18/2019, 8:28 AM  [place] [facility] PaSERVICES [address] [address]Phone: [phone]  Fax:  [phone] Name: Eric COOPMANMRN: [id number] of Birth: 105/17/1982

## 2019-04-19 ENCOUNTER — Ambulatory Visit: Payer: Commercial Managed Care - PPO

## 2019-04-19 ENCOUNTER — Other Ambulatory Visit: Payer: Self-pay

## 2019-04-19 DIAGNOSIS — R2689 Other abnormalities of gait and mobility: Secondary | ICD-10-CM

## 2019-04-19 DIAGNOSIS — M6281 Muscle weakness (generalized): Secondary | ICD-10-CM | POA: Diagnosis not present

## 2019-04-19 DIAGNOSIS — G8929 Other chronic pain: Secondary | ICD-10-CM

## 2019-04-19 DIAGNOSIS — M5442 Lumbago with sciatica, left side: Secondary | ICD-10-CM

## 2019-04-19 NOTE — Therapy (Signed)
[facility] REH[facility] SERVICES [address] [place], [place], [address] Phone: [phone]   Fax:  [phone]  Physical Therapy Treatment/ RECERT  Patient Details  Name: Eric Lane MRN: [id number] Date of Birth: 12/01/1980 Referring Provider (PT): Dr. Cram   Encounter Date: 04/19/2019  PT End of Session - 04/19/19 2038    Visit Number  25    Number of Visits  33    Date for PT Re-Evaluation  06/14/19    Authorization Type  5/10 PN 04/04/19    PT Start Time  1646    PT Stop Time  1729    PT Time Calculation (min)  43 min    Activity Tolerance  Patient tolerated treatment well    Behavior During Therapy  WFL for tasks assessed/performed       History reviewed. No pertinent past medical history.  Past Surgical History:  Procedure Laterality Date  . EYE SURGERY    . LASIK Bilateral 09/20/2014    There were no vitals filed for this visit.  Subjective Assessment - 04/19/19 2034    Subjective  Patient reports feeling sore after last session but not painful. Reports his pain is averaging a 1 with occasional days of no pain however his activities are still severely restricted in regards to his prior level.    Pertinent History  Patient received surgery on 11/14/18 for grade I spondylolisthesis L5-S1 with severe spinal stenosis bilateral L5 radiculopathies left greater than right, he underwent PLIF L5-S1. Has seen this therapist earlier this year prior to this surgery and COVID-19 outbreak. Per patient no extension, no sit ups, is ok to do planks. Patient presents in back brace, is supposed to be in for 2 months s/p surgery.  Tingling numbness in big toe of right side. Returned back to work Monday. More sore in the middle of the night.    Limitations  Standing;Walking;Sitting;Lifting;House hold activities    How long can you sit comfortably?  20-30 minutes    How long can you stand comfortably?  hasn't stood still for long duration    How long  can you walk comfortably?  has walked 3 miles    Diagnostic tests  s/p surgery    Patient Stated Goals  improve gait/strength, return to biking, golfing, running    Currently in Pain?  Yes    Pain Score  1     Pain Location  Back    Pain Orientation  Lower    Pain Descriptors / Indicators  Aching    Pain Type  Chronic pain;Surgical pain    Pain Onset  More than a month ago    Pain Frequency  Constant        goals: biking outdoors and return to golf  Pain been 1-2/10 , some days of no pain,   Supine:  Hamstring stretch, straight LE with calf on PT shoulder for inferior distraction and stabilization above knee, 60 seconds x 2 trials each LE with progressive increase in range with prolonged hold Popliteal angle hamstring lengthening with calf on PT shoulder for optimal angle of increasing stretch 60 seconds x 1 trial each LE Piriformis lengthening in figure four position with PT overpressure x 60 seconds each LE SAD with belt with LE's in figure 4 position 3x20 second inferior holds each LE; patient reports relief of symptoms with repetition of hold LE rotation <45 degrees each direction for decreasing tension with increasing rotational load x 60 seconds  Quadruped:   UE reaches cross body/midline to PT hand with alternating frequency x12 each LE; cueing for core stabilization and positioning UE lateral abduction, thumb up "T" position, alternating UE, cueing for arc of movement, very challenging for patient x 10 each UE Child pose stretch 30 seconds x 2 trials  Standing:  Pause squat above chair ~5-8 second holds in deep squat position x 10 trials, tremor of LLE noted with repetition Posterior lunge with UE support ; focus on posterior knee leading intervention   Seated: LAQ with rainbow ball adduction between ankles with additional core stability challenge x 12     Pt educated throughout session about proper posture and technique with exercises. Improved exercise technique, movement  at target joints, use of target muscles after min to mod verbal, visual, tactile cues    Goals recently performed on 04/04/19,please refer to this note for further details. Due to patient progression towards functional mobility but continued limitation in regards to return to sport/activity a continuation with a trial period of a reduction of frequency to 1x/week will benefit patient at this time to allow for continued return to PLOF with advanced mobility. Pt would continue to benefit from skilled PT intervention in order to increase mobility and strength in order to decrease pain and increase quality of life                    PT Education - 04/19/19 2037    Education Details  POC, exercise technique, body mehcanics, manual    Person(s) Educated  Patient    Methods  Explanation;Demonstration;Tactile cues;Verbal cues    Comprehension  Verbalized understanding;Returned demonstration;Verbal cues required;Tactile cues required       PT Short Term Goals - 04/19/19 2044      PT SHORT TERM GOAL #1   Title  Pt will be independent with HEP in order to improve strength and decrease back pain in order to improve pain-free function at home and work.     Baseline  10/29: given 12/14: HEP compliant    Time  4    Period  Weeks    Status  Achieved    Target Date  01/26/19      PT SHORT TERM GOAL #2   Title  Patient will perform either home or gym workout session 3x/week for return to PLOF and strength.    Baseline  2/17: initiating increase of home/gym protocol with decrease of PT sessions    Time  4    Period  Weeks    Status  New    Target Date  05/17/19        PT Long Term Goals - 04/19/19 2045      PT LONG TERM GOAL #1   Title  Patient will reduce modified Oswestry score to <20 as to demonstrate minimal disability with ADLs including improved sleeping tolerance, walking/sitting tolerance etc for better mobility with ADLs.    Baseline  10/29: 36% 12/14: 30 % 2/3: 20%     Time  8    Period  Weeks    Status  Partially Met    Target Date  06/14/19      PT LONG TERM GOAL #2   Title  Patient will increase BLE gross strength to 4+/5 as to improve functional strength for independent gait, increased standing tolerance and increased ADL ability.    Baseline  10/29: LLE grossly 3+/5 RLE 4-/5 12/14: LLE hip flex, adduct, and extension 4-/5, abduction and knees 4/5 RLE 4/5   2/3: R 4+/5 L 4/5 with hip flex and extension 4+/5    Time  8    Period  Weeks    Status  Partially Met    Target Date  06/14/19      PT LONG TERM GOAL #3   Title  Patient (< 23 years old) will complete five times sit to stand test in < 10 seconds indicating an increased LE strength and improved balance.    Baseline  10/29: 13 seconds 12/14: 9 seconds    Time  8    Period  Weeks    Status  Achieved      PT LONG TERM GOAL #4   Title  Patient will report average VAS of 1/10 to improve tolerance with ADLs and reduced symptoms with activities.    Baseline  10/29: 3/10 12/14: 3/10 2/3: 2/10 2/17: 1-2 /10    Time  8    Period  Weeks    Status  Partially Met    Target Date  06/14/19      PT LONG TERM GOAL #5   Title  Patient will tolerate gym program within protocol weight limit including: spin bike, weight program, and cardio to return to PLOF.    Baseline  12/24: has not started gym program yet at this time 2/2: able to do some modified exercises still very limited. 2/17: unable to spin > 10 minutes, initiating return to light weight program    Time  8    Period  Weeks    Status  Partially Met    Target Date  06/14/19      Additional Long Term Goals   Additional Long Term Goals  Yes      PT LONG TERM GOAL #6   Title  Patient will return to modified golfing as well as return to cycling > 20 minutes on bike for functional return to sport with compliance to protocol for return to PLOF.    Baseline  2/17; unable    Time  8    Period  Weeks    Status  New    Target Date  06/14/19             Plan - 04/19/19 2040    Clinical Impression Statement  Goals recently performed on 04/04/19,please refer to this note for further details. Due to patient progression towards functional mobility but continued limitation in regards to return to sport/activity a continuation with a trial period of a reduction of frequency to 1x/week will benefit patient at this time to allow for continued return to PLOF with advanced mobility. Progression of goal focus to higher level mobility can be seen in section below to mirror return to sport within protocol guidelines. Pt would continue to benefit from skilled PT intervention in order to increase mobility and strength in order to decrease pain and increase quality of life    Personal Factors and Comorbidities  Comorbidity 2    Comorbidities  HTN, GERD, hypercholesteremia, hypotestosteronism, cardiac murmur,    Examination-Activity Limitations  Bed Mobility;Bathing;Bend;Caring for Others;Carry;Hygiene/Grooming;Stairs;Squat;Reach Overhead;Locomotion Level;Lift;Stand;Toileting;Transfers    Examination-Participation Restrictions  Church;Cleaning;Community Activity;Interpersonal Relationship;Laundry;Volunteer;Shop;Meal Prep;Yard Work    [employer]  Evolving/Moderate complexity    Rehab Potential  Good    PT Frequency  1x / week    PT Duration  8 weeks    PT Treatment/Interventions  ADLs/Self Care Home Management;Biofeedback;Aquatic Therapy;Canalith Repostioning;Cryotherapy;Electrical Stimulation;Iontophoresis 36m/ml Dexamethasone;Moist Heat;Traction;Ultrasound;DME Instruction;Gait training;Stair training;Functional mobility training;Therapeutic activities;Therapeutic exercise;Balance training;Neuromuscular re-education;Patient/family education;Manual techniques;Passive range  of motion;Dry needling;Energy conservation;Taping;Vestibular;Scar mobilization;Compression bandaging    PT Next Visit Plan  1x/week    PT Home Exercise Plan  see  above    Consulted and Agree with Plan of Care  Patient       Patient will benefit from skilled therapeutic intervention in order to improve the following deficits and impairments:  Pain, Postural dysfunction, Decreased strength, Difficulty walking, Abnormal gait, Decreased activity tolerance, Decreased endurance, Decreased scar mobility, Decreased mobility, Decreased range of motion, Hypomobility, Impaired flexibility, Increased muscle spasms, Impaired sensation, Improper body mechanics  Visit Diagnosis: Muscle weakness (generalized)  Chronic left-sided low back pain with left-sided sciatica  Other abnormalities of gait and mobility     Problem List Patient Active Problem List   Diagnosis Date Noted  . Spondylolisthesis at L5-S1 level 11/14/2018  . Essential hypertension 03/30/2018  . CN (constipation) 08/15/2014  . Elevated blood sugar 08/15/2014  . GERD (gastroesophageal reflux disease) 08/15/2014  . External hemorrhoid 08/15/2014  . Hypercholesteremia 08/15/2014  . Hypotestosteronism 08/15/2014  . Cardiac murmur 08/15/2014  . Awareness of heartbeats 08/15/2014  . Avitaminosis D 08/15/2014   Janna Arch, PT, DPT   04/19/2019, 8:51 PM  [place] MAIN [facility] SERVICES [address] [place], [place], [address] Phone: [phone]   Fax:  [phone]  Name: PENG THORSTENSON MRN: [id number] Date of Birth: [birth year]

## 2019-04-26 ENCOUNTER — Ambulatory Visit: Payer: Commercial Managed Care - PPO

## 2019-04-26 ENCOUNTER — Other Ambulatory Visit: Payer: Self-pay

## 2019-04-26 DIAGNOSIS — M6281 Muscle weakness (generalized): Secondary | ICD-10-CM

## 2019-04-26 DIAGNOSIS — R2689 Other abnormalities of gait and mobility: Secondary | ICD-10-CM

## 2019-04-26 DIAGNOSIS — G8929 Other chronic pain: Secondary | ICD-10-CM

## 2019-04-26 DIAGNOSIS — M545 Low back pain, unspecified: Secondary | ICD-10-CM

## 2019-04-26 NOTE — Therapy (Signed)
[place] MAIN [facility] SERVICES [address] [place], [place], [address] Phone: [phone]   Fax:  [phone]  Physical Therapy Treatment  Patient Details  Name: Eric Lane MRN: [id number] Date of Birth: Aug 22, 1980 Referring Provider (PT): Dr. Saintclair Halsted   Encounter Date: 04/26/2019  PT End of Session - 04/26/19 1812    Visit Number  26    Number of Visits  33    Date for PT Re-Evaluation  06/14/19    Authorization Type  6/10 PN 04/04/19    PT Start Time  1648    PT Stop Time  1728    PT Time Calculation (min)  40 min    Activity Tolerance  Patient tolerated treatment well    Behavior During Therapy  [facility] for tasks assessed/performed       History reviewed. No pertinent past medical history.  Past Surgical History:  Procedure Laterality Date  . EYE SURGERY    . LASIK Bilateral 09/20/2014    There were no vitals filed for this visit.  Hamstring stretch performed by PT, 2x 60 sec each LE  Piriformis stretch figure 4 with PT overpressure, 60 sec each LE x2 trials each LE   Modified plank with UE crossbody weight pull, 4#, 10x total, cueing for abdominal engagement and form  Plank with UE on plinth, UE crossbody weight pull, 4#, cueing for proper form/alignment  Prone Ys, 10x, cueing for thumb pointing up in order to target proper musculature and for scapular engagement  Silver SB back extension with feet stabilized against wall, 10x, for strengthening spinal extensors  Squat holding PVC pipe against chest, 5x Progressed to long bar on cable machine with 2.5# each side, 10x, bar for upright posture and improved body mechanics, cueing for sitting back into heels  Cable row in half kneel on airex, 7.5#, 10x each UE, cueing for proper scapular retraction and decreased shoulder elevation  Cable chop (batl attachment) in half kneel on airex, 2.5#, 10x facing each direction, abdominal strengthening for introduction to rotation   Seated on  silver SB marches, alternating 10x total, cueing for upright posture  Single leg press, 42.5#, 10x each LE  Standing march with TB around feet, alternating 10x total, chair back for UE support  Hooklying foam roll pec stretch, 20 sec, cueing for UE positioning to stretch proper musculature  Hooklying on foam roll, YTB bilateral ER/band pull-aparts, 10x, cueing for scapular engagement  Patient reported to PT with excellent motivation. He performed well this session with core engagement and was able to correct body mechanics with Min VC. He continues to present with limited hamstring muscle length and with mobility deficits. Patient has progressed toward introduction of trunk rotation to prepare for return to sport. Patient would benefit from further skilled PT intervention to improve strength and mobility under surgeon protocol in order to decrease pain and increase QOL.       Subjective Assessment - 04/26/19 1801    Subjective  Patient reports no pain today. Has been going to gym about two times a week.    Pertinent History  Patient received surgery on 11/14/18 for grade I spondylolisthesis L5-S1 with severe spinal stenosis bilateral L5 radiculopathies left greater than right, he underwent PLIF L5-S1. Has seen this therapist earlier this year prior to this surgery and COVID-19 outbreak. Per patient no extension, no sit ups, is ok to do planks. Patient presents in back brace, is supposed to be in for 2 months s/p surgery.  Tingling numbness in big toe of right side. Returned back to work Monday. More sore in the middle of the night.    Limitations  Standing;Walking;Sitting;Lifting;House hold activities    How long can you sit comfortably?  20-30 minutes    How long can you stand comfortably?  hasn't stood still for long duration    How long can you walk comfortably?  has walked 3 miles    Diagnostic tests  s/p surgery    Patient Stated Goals  improve gait/strength, return to biking, golfing,  running    Currently in Pain?  No/denies    Pain Score  0-No pain    Pain Onset  More than a month ago    Multiple Pain Sites  No                             PT Education - 04/26/19 1800    Education Details  exercise technique, body mechanics    Person(s) Educated  Patient    Methods  Explanation;Demonstration;Tactile cues;Verbal cues    Comprehension  Verbalized understanding;Returned demonstration;Verbal cues required;Tactile cues required       PT Short Term Goals - 04/19/19 2044      PT SHORT TERM GOAL #1   Title  Pt will be independent with HEP in order to improve strength and decrease back pain in order to improve pain-free function at home and work.     Baseline  10/29: given 12/14: HEP compliant    Time  4    Period  Weeks    Status  Achieved    Target Date  01/26/19      PT SHORT TERM GOAL #2   Title  Patient will perform either home or gym workout session 3x/week for return to PLOF and strength.    Baseline  2/17: initiating increase of home/gym protocol with decrease of PT sessions    Time  4    Period  Weeks    Status  New    Target Date  05/17/19        PT Long Term Goals - 04/19/19 2045      PT LONG TERM GOAL #1   Title  Patient will reduce modified Oswestry score to <20 as to demonstrate minimal disability with ADLs including improved sleeping tolerance, walking/sitting tolerance etc for better mobility with ADLs.    Baseline  10/29: 36% 12/14: 30 % 2/3: 20%    Time  8    Period  Weeks    Status  Partially Met    Target Date  06/14/19      PT LONG TERM GOAL #2   Title  Patient will increase BLE gross strength to 4+/5 as to improve functional strength for independent gait, increased standing tolerance and increased ADL ability.    Baseline  10/29: LLE grossly 3+/5 RLE 4-/5 12/14: LLE hip flex, adduct, and extension 4-/5, abduction and knees 4/5 RLE 4/5 2/3: R 4+/5 L 4/5 with hip flex and extension 4+/5    Time  8    Period  Weeks     Status  Partially Met    Target Date  06/14/19      PT LONG TERM GOAL #3   Title  Patient (< 34 years old) will complete five times sit to stand test in < 10 seconds indicating an increased LE strength and improved balance.    Baseline  10/29: 13 seconds 12/14: 9  seconds    Time  8    Period  Weeks    Status  Achieved      PT LONG TERM GOAL #4   Title  Patient will report average VAS of 1/10 to improve tolerance with ADLs and reduced symptoms with activities.    Baseline  10/29: 3/10 12/14: 3/10 2/3: 2/10 2/17: 1-2 /10    Time  8    Period  Weeks    Status  Partially Met    Target Date  06/14/19      PT LONG TERM GOAL #5   Title  Patient will tolerate gym program within protocol weight limit including: spin bike, weight program, and cardio to return to PLOF.    Baseline  12/24: has not started gym program yet at this time 2/2: able to do some modified exercises still very limited. 2/17: unable to spin > 10 minutes, initiating return to light weight program    Time  8    Period  Weeks    Status  Partially Met    Target Date  06/14/19      Additional Long Term Goals   Additional Long Term Goals  Yes      PT LONG TERM GOAL #6   Title  Patient will return to modified golfing as well as return to cycling > 20 minutes on bike for functional return to sport with compliance to protocol for return to PLOF.    Baseline  2/17; unable    Time  8    Period  Weeks    Status  New    Target Date  06/14/19            Plan - 04/26/19 1811    Clinical Impression Statement  Patient reported to PT with excellent motivation. He performed well this session with core engagement and was able to correct body mechanics with Min VC. He continues to present with limited hamstring muscle length and with mobility deficits. Patient has progressed toward introduction of trunk rotation to prepare for return to sport. Patient would benefit from further skilled PT intervention to improve strength and  mobility under surgeon protocol in order to decrease pain and increase QOL.    Personal Factors and Comorbidities  Comorbidity 2    Comorbidities  HTN, GERD, hypercholesteremia, hypotestosteronism, cardiac murmur,    Examination-Activity Limitations  Bed Mobility;Bathing;Bend;Caring for Others;Carry;Hygiene/Grooming;Stairs;Squat;Reach Overhead;Locomotion Level;Lift;Stand;Toileting;Transfers    Examination-Participation Restrictions  Church;Cleaning;Community Activity;Interpersonal Relationship;Laundry;Volunteer;Shop;Meal Prep;Yard Work    [employer] officer  Evolving/Moderate complexity    Rehab Potential  Good    PT Frequency  1x / week    PT Duration  8 weeks    PT Treatment/Interventions  ADLs/Self Care Home Management;Biofeedback;Aquatic Therapy;Canalith Repostioning;Cryotherapy;Electrical Stimulation;Iontophoresis 54m/ml Dexamethasone;Moist Heat;Traction;Ultrasound;DME Instruction;Gait training;Stair training;Functional mobility training;Therapeutic activities;Therapeutic exercise;Balance training;Neuromuscular re-education;Patient/family education;Manual techniques;Passive range of motion;Dry needling;Energy conservation;Taping;Vestibular;Scar mobilization;Compression bandaging    PT Next Visit Plan  1x/week    PT Home Exercise Plan  see above    Consulted and Agree with Plan of Care  Patient       Patient will benefit from skilled therapeutic intervention in order to improve the following deficits and impairments:  Pain, Postural dysfunction, Decreased strength, Difficulty walking, Abnormal gait, Decreased activity tolerance, Decreased endurance, Decreased scar mobility, Decreased mobility, Decreased range of motion, Hypomobility, Impaired flexibility, Increased muscle spasms, Impaired sensation, Improper body mechanics  Visit Diagnosis: Muscle weakness (generalized)  Other abnormalities of gait and mobility  Chronic midline low back pain, unspecified whether  sciatica  present     Problem List Patient Active Problem List   Diagnosis Date Noted  . Spondylolisthesis at L5-S1 level 11/14/2018  . Essential hypertension 03/30/2018  . CN (constipation) 08/15/2014  . Elevated blood sugar 08/15/2014  . GERD (gastroesophageal reflux disease) 08/15/2014  . External hemorrhoid 08/15/2014  . Hypercholesteremia 08/15/2014  . Hypotestosteronism 08/15/2014  . Cardiac murmur 08/15/2014  . Awareness of heartbeats 08/15/2014  . Avitaminosis D 08/15/2014    Florinda Marker, SPT   This entire session was performed under direct supervision and direction of a licensed therapist/therapist assistant . I have personally read, edited and approve of the note as written.  Janna Arch, PT, DPT   04/26/2019, 6:14 PM  [place] MAIN [facility] SERVICES [address] [place], [place], [address] Phone: [phone]   Fax:  [phone]  Name: Eric Lane MRN: [id number] Date of Birth: [birth year]

## 2019-05-03 ENCOUNTER — Ambulatory Visit: Payer: Commercial Managed Care - PPO | Attending: Neurosurgery

## 2019-05-03 ENCOUNTER — Other Ambulatory Visit: Payer: Self-pay

## 2019-05-03 DIAGNOSIS — M545 Low back pain, unspecified: Secondary | ICD-10-CM

## 2019-05-03 DIAGNOSIS — R2689 Other abnormalities of gait and mobility: Secondary | ICD-10-CM | POA: Insufficient documentation

## 2019-05-03 DIAGNOSIS — G8929 Other chronic pain: Secondary | ICD-10-CM | POA: Diagnosis present

## 2019-05-03 DIAGNOSIS — M6281 Muscle weakness (generalized): Secondary | ICD-10-CM | POA: Diagnosis present

## 2019-05-03 NOTE — Therapy (Signed)
[place] MAIN [facility] SERVICES [address] [place], [place], [address] Phone: [phone]   Fax:  [phone]  Physical Therapy Treatment  Patient Details  Name: Eric Lane MRN: [id number] Date of Birth: [birth year] Referring Provider (PT): Dr. Saintclair Halsted   Encounter Date: 05/03/2019  PT End of Session - 05/04/19 1000    Visit Number  27    Number of Visits  33    Date for PT Re-Evaluation  06/14/19    Authorization Type  7/10 PN 04/04/19    PT Start Time  1639    PT Stop Time  1729    PT Time Calculation (min)  50 min    Activity Tolerance  Patient tolerated treatment well    Behavior During Therapy  [facility] for tasks assessed/performed       History reviewed. No pertinent past medical history.  Past Surgical History:  Procedure Laterality Date  . EYE SURGERY    . LASIK Bilateral 09/20/2014    There were no vitals filed for this visit.  Subjective Assessment - 05/04/19 0746    Subjective  Patient went to surgeon, reports 80% healing of surgical site. Has been given the ok for slow return to sport progression.    Pertinent History  Patient received surgery on 11/14/18 for grade I spondylolisthesis L5-S1 with severe spinal stenosis bilateral L5 radiculopathies left greater than right, he underwent PLIF L5-S1. Has seen this therapist earlier this year prior to this surgery and COVID-19 outbreak. Per patient no extension, no sit ups, is ok to do planks. Patient presents in back brace, is supposed to be in for 2 months s/p surgery.  Tingling numbness in big toe of right side. Returned back to work Monday. More sore in the middle of the night.    Limitations  Standing;Walking;Sitting;Lifting;House hold activities    How long can you sit comfortably?  20-30 minutes    How long can you stand comfortably?  hasn't stood still for long duration    How long can you walk comfortably?  has walked 3 miles    Diagnostic tests  s/p surgery    Patient Stated  Goals  improve gait/strength, return to biking, golfing, running    Currently in Pain?  Yes    Pain Score  1     Pain Location  Back    Pain Orientation  Lower    Pain Descriptors / Indicators  Aching    Pain Type  Surgical pain    Pain Onset  More than a month ago    Pain Frequency  Constant       Patient went to surgeon, reports 80% healing of surgical site. Has been given the ok for slow return to sport progression.    Supine:  Hamstring stretch, leg on PT shoulder for optimal overpressure with increasing range with repetition, 60 seconds each LE, x 2 sets. Popliteal angle lengthening with PT overpressure 45 seconds x2 trials each LE Piriformis lengthening 2x 60 seconds each LE with PT overpressure Swiss ball TrA contraction pressing ball between knees and hands with additional posterior pelvic tilt 10x 3 second holds Swiss ball TrA contraction with alternating UE/LE raises (deadbug modified) with posterior pelvic tilt 10x 3 second holds  Standing: Raised plinth table:  -modified elevated pushups, correction/cueing for hand placement and core contraction 10x -modified elevated plank with cross body shoulder taps alternating sides, very challenging for patient, 10x   Cable Machine  -#12.5 modified single arm lat  pull down with focus on slow controlled supination and pronation with pull 12x each arm, #12.5 bent over row, tactile cueing for body mechanics, proper hip hinge and core contraction, cues for keeping elbows closer to body for optimal muscle recruitment 12x #2.5 shoulder ER with focus on controlled abdominal activation and sequencing. 15x each UE, very challenging  Prone Roller to lumbar paraspinal and lateral trunk musculature with additional rolling to piriformis bilaterally x 5 minutes Mobilizations grade II-III for thoracic region, UPAs and CPAs with hypomobility noted bilaterally with R>L. Reduced with repetition x 5 minutes    Pt educated throughout session about  proper posture and technique with exercises. Improved exercise technique, movement at target joints, use of target muscles after min to mod verbal, visual, tactile cues                  PT Education - 05/04/19 0747    Education Details  exercise technique, body mechanics    Person(s) Educated  Patient    Methods  Explanation;Demonstration;Tactile cues;Verbal cues    Comprehension  Verbalized understanding;Returned demonstration;Verbal cues required;Tactile cues required       PT Short Term Goals - 04/19/19 2044      PT SHORT TERM GOAL #1   Title  Pt will be independent with HEP in order to improve strength and decrease back pain in order to improve pain-free function at home and work.     Baseline  10/29: given 12/14: HEP compliant    Time  4    Period  Weeks    Status  Achieved    Target Date  01/26/19      PT SHORT TERM GOAL #2   Title  Patient will perform either home or gym workout session 3x/week for return to PLOF and strength.    Baseline  2/17: initiating increase of home/gym protocol with decrease of PT sessions    Time  4    Period  Weeks    Status  New    Target Date  05/17/19        PT Long Term Goals - 04/19/19 2045      PT LONG TERM GOAL #1   Title  Patient will reduce modified Oswestry score to <20 as to demonstrate minimal disability with ADLs including improved sleeping tolerance, walking/sitting tolerance etc for better mobility with ADLs.    Baseline  10/29: 36% 12/14: 30 % 2/3: 20%    Time  8    Period  Weeks    Status  Partially Met    Target Date  06/14/19      PT LONG TERM GOAL #2   Title  Patient will increase BLE gross strength to 4+/5 as to improve functional strength for independent gait, increased standing tolerance and increased ADL ability.    Baseline  10/29: LLE grossly 3+/5 RLE 4-/5 12/14: LLE hip flex, adduct, and extension 4-/5, abduction and knees 4/5 RLE 4/5 2/3: R 4+/5 L 4/5 with hip flex and extension 4+/5    Time  8     Period  Weeks    Status  Partially Met    Target Date  06/14/19      PT LONG TERM GOAL #3   Title  Patient (< 4 years old) will complete five times sit to stand test in < 10 seconds indicating an increased LE strength and improved balance.    Baseline  10/29: 13 seconds 12/14: 9 seconds    Time  8  Period  Weeks    Status  Achieved      PT LONG TERM GOAL #4   Title  Patient will report average VAS of 1/10 to improve tolerance with ADLs and reduced symptoms with activities.    Baseline  10/29: 3/10 12/14: 3/10 2/3: 2/10 2/17: 1-2 /10    Time  8    Period  Weeks    Status  Partially Met    Target Date  06/14/19      PT LONG TERM GOAL #5   Title  Patient will tolerate gym program within protocol weight limit including: spin bike, weight program, and cardio to return to PLOF.    Baseline  12/24: has not started gym program yet at this time 2/2: able to do some modified exercises still very limited. 2/17: unable to spin > 10 minutes, initiating return to light weight program    Time  8    Period  Weeks    Status  Partially Met    Target Date  06/14/19      Additional Long Term Goals   Additional Long Term Goals  Yes      PT LONG TERM GOAL #6   Title  Patient will return to modified golfing as well as return to cycling > 20 minutes on bike for functional return to sport with compliance to protocol for return to PLOF.    Baseline  2/17; unable    Time  8    Period  Weeks    Status  New    Target Date  06/14/19            Plan - 05/04/19 1003    Clinical Impression Statement  Patient cleared for return to sport progression with slow introduction. Core stability and functional mobility continues to progress with patient tolerating slow increase in tolerance, however prolonged activation continue to be challenged at this time with noticeable tremors of muscle fibers. He continues to be limited in hamstring length bilaterally creating postural strain on post surgical spinal  sight however improves with repeated hold. Side planks can be performed modified however is challenging with patient requiring assistance to obtain neutral positioning. Pt would continue to benefit from skilled PT intervention in order to increase mobility and strength in order to decrease pain and increase quality of life    Personal Factors and Comorbidities  Comorbidity 2    Comorbidities  HTN, GERD, hypercholesteremia, hypotestosteronism, cardiac murmur,    Examination-Activity Limitations  Bed Mobility;Bathing;Bend;Caring for Others;Carry;Hygiene/Grooming;Stairs;Squat;Reach Overhead;Locomotion Level;Lift;Stand;Toileting;Transfers    Examination-Participation Restrictions  Church;Cleaning;Community Activity;Interpersonal Relationship;Laundry;Volunteer;Shop;Meal Prep;Yard Work    [employer]  Evolving/Moderate complexity    Rehab Potential  Good    PT Frequency  1x / week    PT Duration  8 weeks    PT Treatment/Interventions  ADLs/Self Care Home Management;Biofeedback;Aquatic Therapy;Canalith Repostioning;Cryotherapy;Electrical Stimulation;Iontophoresis 96m/ml Dexamethasone;Moist Heat;Traction;Ultrasound;DME Instruction;Gait training;Stair training;Functional mobility training;Therapeutic activities;Therapeutic exercise;Balance training;Neuromuscular re-education;Patient/family education;Manual techniques;Passive range of motion;Dry needling;Energy conservation;Taping;Vestibular;Scar mobilization;Compression bandaging    PT Next Visit Plan  1x/week    PT Home Exercise Plan  see above    Consulted and Agree with Plan of Care  Patient       Patient will benefit from skilled therapeutic intervention in order to improve the following deficits and impairments:  Pain, Postural dysfunction, Decreased strength, Difficulty walking, Abnormal gait, Decreased activity tolerance, Decreased endurance, Decreased scar mobility, Decreased mobility, Decreased range of motion, Hypomobility,  Impaired flexibility, Increased muscle spasms, Impaired sensation, Improper body mechanics  Visit Diagnosis: Muscle weakness (generalized)  Other abnormalities of gait and mobility  Chronic midline low back pain, unspecified whether sciatica present     Problem List Patient Active Problem List   Diagnosis Date Noted  . Spondylolisthesis at L5-S1 level 11/14/2018  . Essential hypertension 03/30/2018  . CN (constipation) 08/15/2014  . Elevated blood sugar 08/15/2014  . GERD (gastroesophageal reflux disease) 08/15/2014  . External hemorrhoid 08/15/2014  . Hypercholesteremia 08/15/2014  . Hypotestosteronism 08/15/2014  . Cardiac murmur 08/15/2014  . Awareness of heartbeats 08/15/2014  . Avitaminosis D 08/15/2014   Janna Arch, PT, DPT   05/04/2019, 10:04 AM  [place] MAIN [facility] SERVICES [address] [place], [place], [address] Phone: [phone]   Fax:  [phone]  Name: MUNG RINKER MRN: [id number] Date of Birth: 26-Jul-1980

## 2019-05-10 ENCOUNTER — Other Ambulatory Visit: Payer: Self-pay

## 2019-05-10 ENCOUNTER — Ambulatory Visit: Payer: Commercial Managed Care - PPO

## 2019-05-10 DIAGNOSIS — M545 Low back pain, unspecified: Secondary | ICD-10-CM

## 2019-05-10 DIAGNOSIS — M6281 Muscle weakness (generalized): Secondary | ICD-10-CM | POA: Diagnosis not present

## 2019-05-10 DIAGNOSIS — G8929 Other chronic pain: Secondary | ICD-10-CM

## 2019-05-10 DIAGNOSIS — R2689 Other abnormalities of gait and mobility: Secondary | ICD-10-CM

## 2019-05-10 NOTE — Therapy (Signed)
[place] MAIN [facility] SERVICES [address] [place], [place], [address] Phone: [phone]   Fax:  [phone]  Physical Therapy Treatment  Patient Details  Name: Eric Lane MRN: [id number] Date of Birth: 1981/02/25 Referring Provider (PT): Dr. Saintclair Halsted   Encounter Date: 05/10/2019  PT End of Session - 05/10/19 1854    Visit Number  28    Number of Visits  33    Date for PT Re-Evaluation  06/14/19    Authorization Type  8/10 PN 04/04/19    PT Start Time  1646    PT Stop Time  1730    PT Time Calculation (min)  44 min    Activity Tolerance  Patient tolerated treatment well    Behavior During Therapy  [facility] for tasks assessed/performed       History reviewed. No pertinent past medical history.  Past Surgical History:  Procedure Laterality Date  . EYE SURGERY    . LASIK Bilateral 09/20/2014    There were no vitals filed for this visit.  Subjective Assessment - 05/10/19 1849    Subjective  Patient has worked multiple 12 hour a day shifts this week, is very sore and fatigued. Compliant with HEP    Pertinent History  Patient received surgery on 11/14/18 for grade I spondylolisthesis L5-S1 with severe spinal stenosis bilateral L5 radiculopathies left greater than right, he underwent PLIF L5-S1. Has seen this therapist earlier this year prior to this surgery and COVID-19 outbreak. Per patient no extension, no sit ups, is ok to do planks. Patient presents in back brace, is supposed to be in for 2 months s/p surgery.  Tingling numbness in big toe of right side. Returned back to work Monday. More sore in the middle of the night.    Limitations  Standing;Walking;Sitting;Lifting;House hold activities    How long can you sit comfortably?  20-30 minutes    How long can you stand comfortably?  hasn't stood still for long duration    How long can you walk comfortably?  has walked 3 miles    Diagnostic tests  s/p surgery    Patient Stated Goals  improve  gait/strength, return to biking, golfing, running    Currently in Pain?  Yes    Pain Score  1     Pain Location  Back    Pain Orientation  Lower    Pain Descriptors / Indicators  Aching    Pain Type  Surgical pain    Pain Onset  More than a month ago    Pain Frequency  Constant           Supine:  Hamstring stretch, leg on PT shoulder for optimal overpressure with increasing range with repetition, 60 seconds each LE, x 2 sets. Popliteal angle lengthening with PT overpressure 45 seconds x2 trials each LE Piriformis lengthening 2x 60 seconds each LE with PT overpressure   Standing: RTB resisted end swing for carryover to golf swing, cueing for keeping hips and shoulder in line for decreased hinging of spine with rotation. Performed both sides, 10x  (added to HEP) Modified thread the needle intervention for thoracic stretch and rotation (added to HEP)   Prone Mobilizations grade II-III for thoracic region, UPAs and CPAs with hypomobility noted bilaterally with R>L. Reduced with repetition x 5 minutes   J inferior mobilization for decreased kyphosis grade II-III 10 seconds x 3 sets  STM with implementation of effleurage and pettrisage for left and right  lumbar  paraspinal and lateral musculature. Noted tenderness with muscle adhesions along top of iliac crest. x9 minutes   Quadruped: Thread the needle; reaching through for optimal thoracic rotation with slight depression into child pose 10x each side.   Examination of plantar aspect of L foot due to c/o of pain in standing: -palpated tight/tender plantar fascia, big toe tender to squeeze test. Educated on nail bed care for big toe due to redness around nail bed and use of golf ball and/or frozen water bottle rollout on bottom of foot for plantar fascia.    Pt educated throughout session about proper posture and technique with exercises. Improved exercise technique, movement at target joints, use of target muscles after min to mod  verbal, visual, tactile cues                      PT Education - 05/10/19 1850    Education Details  exercise technique, body mechanics ,manual    Person(s) Educated  Patient    Methods  Explanation;Demonstration;Tactile cues;Verbal cues    Comprehension  Verbalized understanding;Returned demonstration;Verbal cues required;Tactile cues required       PT Short Term Goals - 04/19/19 2044      PT SHORT TERM GOAL #1   Title  Pt will be independent with HEP in order to improve strength and decrease back pain in order to improve pain-free function at home and work.     Baseline  10/29: given 12/14: HEP compliant    Time  4    Period  Weeks    Status  Achieved    Target Date  01/26/19      PT SHORT TERM GOAL #2   Title  Patient will perform either home or gym workout session 3x/week for return to PLOF and strength.    Baseline  2/17: initiating increase of home/gym protocol with decrease of PT sessions    Time  4    Period  Weeks    Status  New    Target Date  05/17/19        PT Long Term Goals - 04/19/19 2045      PT LONG TERM GOAL #1   Title  Patient will reduce modified Oswestry score to <20 as to demonstrate minimal disability with ADLs including improved sleeping tolerance, walking/sitting tolerance etc for better mobility with ADLs.    Baseline  10/29: 36% 12/14: 30 % 2/3: 20%    Time  8    Period  Weeks    Status  Partially Met    Target Date  06/14/19      PT LONG TERM GOAL #2   Title  Patient will increase BLE gross strength to 4+/5 as to improve functional strength for independent gait, increased standing tolerance and increased ADL ability.    Baseline  10/29: LLE grossly 3+/5 RLE 4-/5 12/14: LLE hip flex, adduct, and extension 4-/5, abduction and knees 4/5 RLE 4/5 2/3: R 4+/5 L 4/5 with hip flex and extension 4+/5    Time  8    Period  Weeks    Status  Partially Met    Target Date  06/14/19      PT LONG TERM GOAL #3   Title  Patient (< 75  years old) will complete five times sit to stand test in < 10 seconds indicating an increased LE strength and improved balance.    Baseline  10/29: 13 seconds 12/14: 9 seconds    Time  8  Period  Weeks    Status  Achieved      PT LONG TERM GOAL #4   Title  Patient will report average VAS of 1/10 to improve tolerance with ADLs and reduced symptoms with activities.    Baseline  10/29: 3/10 12/14: 3/10 2/3: 2/10 2/17: 1-2 /10    Time  8    Period  Weeks    Status  Partially Met    Target Date  06/14/19      PT LONG TERM GOAL #5   Title  Patient will tolerate gym program within protocol weight limit including: spin bike, weight program, and cardio to return to PLOF.    Baseline  12/24: has not started gym program yet at this time 2/2: able to do some modified exercises still very limited. 2/17: unable to spin > 10 minutes, initiating return to light weight program    Time  8    Period  Weeks    Status  Partially Met    Target Date  06/14/19      Additional Long Term Goals   Additional Long Term Goals  Yes      PT LONG TERM GOAL #6   Title  Patient will return to modified golfing as well as return to cycling > 20 minutes on bike for functional return to sport with compliance to protocol for return to PLOF.    Baseline  2/17; unable    Time  8    Period  Weeks    Status  New    Target Date  06/14/19            Plan - 05/10/19 1900    Clinical Impression Statement  Patient has limited muscle tissue length with increased tension and muscle guarding at beginning of session that was improved with manual and therex. Progressive rotation and return to sport interventions performed with no increase in symptoms reported. Patient educated on nail bed care for big toe due to redness around nail bed and use of golf ball and/or frozen water bottle rollout on bottom of foot for plantar fascia.  Patient would benefit from further skilled PT intervention to improve strength and mobility under  surgeon protocol in order to decrease pain and increase QOL    Personal Factors and Comorbidities  Comorbidity 2    Comorbidities  HTN, GERD, hypercholesteremia, hypotestosteronism, cardiac murmur,    Examination-Activity Limitations  Bed Mobility;Bathing;Bend;Caring for Others;Carry;Hygiene/Grooming;Stairs;Squat;Reach Overhead;Locomotion Level;Lift;Stand;Toileting;Transfers    Examination-Participation Restrictions  Church;Cleaning;Community Activity;Interpersonal Relationship;Laundry;Volunteer;Shop;Meal Prep;Yard Work    [employer] officer  Evolving/Moderate complexity    Rehab Potential  Good    PT Frequency  1x / week    PT Duration  8 weeks    PT Treatment/Interventions  ADLs/Self Care Home Management;Biofeedback;Aquatic Therapy;Canalith Repostioning;Cryotherapy;Electrical Stimulation;Iontophoresis 53m/ml Dexamethasone;Moist Heat;Traction;Ultrasound;DME Instruction;Gait training;Stair training;Functional mobility training;Therapeutic activities;Therapeutic exercise;Balance training;Neuromuscular re-education;Patient/family education;Manual techniques;Passive range of motion;Dry needling;Energy conservation;Taping;Vestibular;Scar mobilization;Compression bandaging    PT Next Visit Plan  1x/week    PT Home Exercise Plan  see above    Consulted and Agree with Plan of Care  Patient       Patient will benefit from skilled therapeutic intervention in order to improve the following deficits and impairments:  Pain, Postural dysfunction, Decreased strength, Difficulty walking, Abnormal gait, Decreased activity tolerance, Decreased endurance, Decreased scar mobility, Decreased mobility, Decreased range of motion, Hypomobility, Impaired flexibility, Increased muscle spasms, Impaired sensation, Improper body mechanics  Visit Diagnosis: Muscle weakness (generalized)  Other abnormalities of gait and mobility  Chronic midline low back pain, unspecified whether sciatica  present     Problem List Patient Active Problem List   Diagnosis Date Noted  . Spondylolisthesis at L5-S1 level 11/14/2018  . Essential hypertension 03/30/2018  . CN (constipation) 08/15/2014  . Elevated blood sugar 08/15/2014  . GERD (gastroesophageal reflux disease) 08/15/2014  . External hemorrhoid 08/15/2014  . Hypercholesteremia 08/15/2014  . Hypotestosteronism 08/15/2014  . Cardiac murmur 08/15/2014  . Awareness of heartbeats 08/15/2014  . Avitaminosis D 08/15/2014   Janna Arch, PT, DPT   05/10/2019, 7:01 PM  [place] MAIN [facility] SERVICES [address] [place], [place], [address] Phone: [phone]   Fax:  [phone]  Name: Eric Lane MRN: [id number] Date of Birth: Mar 09, 1980

## 2019-05-12 NOTE — Progress Notes (Signed)
Patient: Eric Lane, Male    DOB: 11-Apr-1980, 39 y.o.   MRN: [id number] Visit Date: 05/15/2019  Today's Provider: Mar Daring, PA-C   Chief Complaint  Patient presents with  . Annual Exam   Subjective:     Annual physical exam Eric Lane is a 39 y.o. male who presents today for health maintenance and complete physical. He feels well. He reports exercising some. He reports he is sleeping fairly well.  Had back surgery last year, 2020. Reports he is doing about 80% better. Still going to PT once weekly.  -----------------------------------------------------------------   Review of Systems  Constitutional: Negative.   HENT: Negative.   Eyes: Negative.   Respiratory: Negative.   Cardiovascular: Negative.   Gastrointestinal: Negative.   Endocrine: Negative.   Genitourinary: Negative.   Musculoskeletal: Negative.   Skin: Negative.   Allergic/Immunologic: Negative.   Neurological: Negative.   Hematological: Negative.   Psychiatric/Behavioral: Negative.     Social History      He  reports that he quit smoking about 14 years ago. His smoking use included cigarettes. He has a 5.00 pack-year smoking history. His smokeless tobacco use includes chew. He reports current alcohol use of about 2.0 standard drinks of alcohol per week. He reports that he does not use drugs.       Social History   Socioeconomic History  . Marital status: Married    Spouse name: Eric Lane  . Number of children: 1  . Years of education: 77  . Highest education level: Not on file  Occupational History  . Occupation: Chemical engineer: [employer]  Tobacco Use  . Smoking status: Former Smoker    Packs/day: 1.00    Years: 5.00    Pack years: 5.00    Types: Cigarettes    Quit date: 03/01/2005    Years since quitting: 14.2  . Smokeless tobacco: Current User    Types: Chew  Substance and Sexual Activity  . Alcohol use: Yes    Alcohol/week: 2.0 standard drinks   Types: 2 Standard drinks or equivalent per week    Comment: OCCASIONALLY  . Drug use: No  . Sexual activity: Not on file  Other Topics Concern  . Not on file  Social History Narrative  . Not on file   Social Determinants of Health   Financial Resource Strain:   . Difficulty of Paying Living Expenses:   Food Insecurity:   . Worried About Charity fundraiser in the Last Year:   . Arboriculturist in the Last Year:   Transportation Needs:   . Film/video editor (Medical):   Marland Kitchen Lack of Transportation (Non-Medical):   Physical Activity:   . Days of Exercise per Week:   . Minutes of Exercise per Session:   Stress:   . Feeling of Stress :   Social Connections:   . Frequency of Communication with Friends and Family:   . Frequency of Social Gatherings with Friends and Family:   . Attends Religious Services:   . Active Member of Clubs or Organizations:   . Attends Archivist Meetings:   Marland Kitchen Marital Status:     History reviewed. No pertinent past medical history.   Patient Active Problem List   Diagnosis Date Noted  . Spondylolisthesis at L5-S1 level 11/14/2018  . Essential hypertension 03/30/2018  . CN (constipation) 08/15/2014  . Elevated blood sugar 08/15/2014  . GERD (gastroesophageal reflux disease)  08/15/2014  . External hemorrhoid 08/15/2014  . Hypercholesteremia 08/15/2014  . Hypotestosteronism 08/15/2014  . Cardiac murmur 08/15/2014  . Awareness of heartbeats 08/15/2014  . Avitaminosis D 08/15/2014    Past Surgical History:  Procedure Laterality Date  . EYE SURGERY    . LASIK Bilateral 09/20/2014    Family History        Family Status  Relation Name Status  . Mother  Alive  . Father  Alive  . Sister  Alive  . MGM  Alive  . MGF  Deceased  . PGM  Deceased  . PGF  Deceased        His family history includes Alzheimer's disease in his paternal grandmother; COPD in his maternal grandmother; Cancer in his maternal grandfather; Congestive Heart  Failure in his maternal grandmother; Diabetes in his maternal grandmother; Heart attack in his father; Hyperlipidemia in his father; Hypertension in his mother; Stomach cancer in his paternal grandfather.      No Known Allergies   Current Outpatient Medications:  .  acetaminophen (TYLENOL) 500 MG tablet, Take 1,000 mg by mouth every 6 (six) hours as needed for mild pain or headache., Disp: , Rfl:  .  HYDROcodone-acetaminophen (NORCO/VICODIN) 5-325 MG tablet, Take 1 tablet by mouth every 4 (four) hours as needed for moderate pain. (Patient not taking: Reported on 12/29/2018), Disp: 20 tablet, Rfl: 0 .  hydrocortisone (PROCTOSOL HC) 2.5 % rectal cream, Place 1 application rectally 2 (two) times daily. (Patient not taking: Reported on 12/29/2018), Disp: 30 g, Rfl: 0 .  methocarbamol (ROBAXIN) 500 MG tablet, Take 1 tablet (500 mg total) by mouth 4 (four) times daily. (Patient not taking: Reported on 12/29/2018), Disp: 30 tablet, Rfl: 0   Patient Care Team: Rubye Beach as PCP - General (Physician Assistant)    Objective:    Vitals: BP 136/86 (BP Location: Left Arm, Patient Position: Sitting, Cuff Size: Large)   Pulse 80   Temp (!) 97.5 F (36.4 C) (Temporal)   Resp 16   Ht 5' 11.5" (1.816 m)   Wt 227 lb 3.2 oz (103.1 kg)   BMI 31.25 kg/m    Vitals:   05/15/19 0704  BP: 136/86  Pulse: 80  Resp: 16  Temp: (!) 97.5 F (36.4 C)  TempSrc: Temporal  Weight: 227 lb 3.2 oz (103.1 kg)  Height: 5' 11.5" (1.816 m)     Physical Exam Constitutional:      General: He is not in acute distress.    Appearance: Normal appearance. He is well-developed. He is obese. He is not ill-appearing.  HENT:     Head: Normocephalic and atraumatic.     Right Ear: Tympanic membrane, ear canal and external ear normal.     Left Ear: Tympanic membrane, ear canal and external ear normal.  Eyes:     General: No scleral icterus.       Right eye: No discharge.        Left eye: No discharge.      Extraocular Movements: Extraocular movements intact.     Conjunctiva/sclera: Conjunctivae normal.     Pupils: Pupils are equal, round, and reactive to light.  Neck:     Thyroid: No thyromegaly.     Trachea: No tracheal deviation.  Cardiovascular:     Rate and Rhythm: Normal rate and regular rhythm.     Pulses: Normal pulses.     Heart sounds: Normal heart sounds. No murmur.  Pulmonary:     Effort: Pulmonary effort  is normal. No respiratory distress.     Breath sounds: Normal breath sounds. No wheezing or rales.  Chest:     Chest wall: No tenderness.  Abdominal:     General: Abdomen is flat. Bowel sounds are normal. There is no distension.     Palpations: Abdomen is soft. There is no mass.     Tenderness: There is no abdominal tenderness. There is no guarding or rebound.  Musculoskeletal:        General: No tenderness. Normal range of motion.     Cervical back: Normal range of motion and neck supple.     Right lower leg: No edema.     Left lower leg: No edema.  Lymphadenopathy:     Cervical: No cervical adenopathy.  Skin:    General: Skin is warm and dry.     Capillary Refill: Capillary refill takes less than 2 seconds.     Findings: No erythema or rash.  Neurological:     General: No focal deficit present.     Mental Status: He is alert and oriented to person, place, and time. Mental status is at baseline.     Cranial Nerves: No cranial nerve deficit.     Motor: No abnormal muscle tone.     Coordination: Coordination normal.     Deep Tendon Reflexes: Reflexes are normal and symmetric. Reflexes normal.  Psychiatric:        Mood and Affect: Mood normal.        Behavior: Behavior normal.        Thought Content: Thought content normal.        Judgment: Judgment normal.     Depression Screen PHQ 2/9 Scores 05/15/2019 03/30/2018 03/11/2017 02/05/2017  PHQ - 2 Score 0 0 0 0  PHQ- 9 Score - 2 - 0       Assessment & Plan:     Routine Health Maintenance and Physical  Exam  Exercise Activities and Dietary recommendations Goals   None     Immunization History  Administered Date(s) Administered  . DTaP 10/10/1987  . IPV 10/10/1987  . Influenza,inj,Quad PF,6+ Mos 12/11/2015, 01/16/2018  . MMR 06/04/2000  . Td 11/22/2013  . Tdap 11/22/2013    Health Maintenance  Topic Date Due  . HIV Screening  Never done  . TETANUS/TDAP  11/23/2023  . INFLUENZA VACCINE  Completed     Discussed health benefits of physical activity, and encouraged him to engage in regular exercise appropriate for his age and condition.    1. Annual physical exam Normal physical exam today. Will check labs as below and f/u pending lab results. If labs are stable and WNL he will not need to have these rechecked for one year at his next annual physical exam. He is to call the office in the meantime if he has any acute issue, questions or concerns. - CBC with Differential/Platelet - Comprehensive metabolic panel - Hemoglobin A1c - Lipid panel - TSH  2. Screening for HIV without presence of risk factors Will check labs as below and f/u pending results. - HIV Antibody (routine testing w rflx)  3. Essential hypertension Stable. Medication not required at this time. Will check labs as below and f/u pending results. - CBC with Differential/Platelet - Comprehensive metabolic panel - Hemoglobin A1c - Lipid panel - TSH  4. Elevated blood sugar Diet controlled. Will check labs as below and f/u pending results. - CBC with Differential/Platelet - Comprehensive metabolic panel - Hemoglobin A1c - Lipid panel -  TSH  5. Hypercholesteremia Diet controlled. Will check labs as below and f/u pending results. - CBC with Differential/Platelet - Comprehensive metabolic panel - Hemoglobin A1c - Lipid panel - TSH  6. Avitaminosis D H/O this. Will check labs as below and f/u pending results. - CBC with Differential/Platelet - Comprehensive metabolic panel - Vitamin D (25  hydroxy)  7. Class 1 obesity due to excess calories with serious comorbidity and body mass index (BMI) of 31.0 to 31.9 in adult Goint to the gym 2-3 times per week and PT once weekly. Counseled patient on healthy lifestyle modifications including dieting and exercise.  - CBC with Differential/Platelet - Comprehensive metabolic panel - Hemoglobin A1c - Lipid panel - TSH  8. Hypotestosteronism Has had in the past. Also wants to discuss and possibly schedule a vasectomy as well.  - Ambulatory referral to Urology  --------------------------------------------------------------------    Mar Daring, PA-C  [facility]

## 2019-05-15 ENCOUNTER — Encounter: Payer: Self-pay | Admitting: Physician Assistant

## 2019-05-15 ENCOUNTER — Ambulatory Visit (INDEPENDENT_AMBULATORY_CARE_PROVIDER_SITE_OTHER): Payer: Commercial Managed Care - PPO | Admitting: Physician Assistant

## 2019-05-15 ENCOUNTER — Other Ambulatory Visit: Payer: Self-pay

## 2019-05-15 VITALS — BP 136/86 | HR 80 | Temp 97.5°F | Resp 16 | Ht 71.5 in | Wt 227.2 lb

## 2019-05-15 DIAGNOSIS — R739 Hyperglycemia, unspecified: Secondary | ICD-10-CM

## 2019-05-15 DIAGNOSIS — Z6831 Body mass index (BMI) 31.0-31.9, adult: Secondary | ICD-10-CM

## 2019-05-15 DIAGNOSIS — I10 Essential (primary) hypertension: Secondary | ICD-10-CM | POA: Diagnosis not present

## 2019-05-15 DIAGNOSIS — E349 Endocrine disorder, unspecified: Secondary | ICD-10-CM

## 2019-05-15 DIAGNOSIS — E6609 Other obesity due to excess calories: Secondary | ICD-10-CM

## 2019-05-15 DIAGNOSIS — Z114 Encounter for screening for human immunodeficiency virus [HIV]: Secondary | ICD-10-CM

## 2019-05-15 DIAGNOSIS — E7800 Pure hypercholesterolemia, unspecified: Secondary | ICD-10-CM

## 2019-05-15 DIAGNOSIS — E559 Vitamin D deficiency, unspecified: Secondary | ICD-10-CM

## 2019-05-15 DIAGNOSIS — Z0000 Encounter for general adult medical examination without abnormal findings: Secondary | ICD-10-CM

## 2019-05-15 NOTE — Patient Instructions (Signed)

## 2019-05-16 ENCOUNTER — Ambulatory Visit: Payer: Commercial Managed Care - PPO

## 2019-05-16 ENCOUNTER — Telehealth: Payer: Self-pay

## 2019-05-16 LAB — COMPREHENSIVE METABOLIC PANEL
ALT: 17 IU/L (ref 0–44)
AST: 20 IU/L (ref 0–40)
Albumin/Globulin Ratio: 1.8 (ref 1.2–2.2)
Albumin: 4.9 g/dL (ref 4.0–5.0)
Alkaline Phosphatase: 89 IU/L (ref 39–117)
BUN/Creatinine Ratio: 15 (ref 9–20)
BUN: 18 mg/dL (ref 6–20)
Bilirubin Total: 0.4 mg/dL (ref 0.0–1.2)
CO2: 22 mmol/L (ref 20–29)
Calcium: 10.3 mg/dL — ABNORMAL HIGH (ref 8.7–10.2)
Chloride: 100 mmol/L (ref 96–106)
Creatinine, Ser: 1.23 mg/dL (ref 0.76–1.27)
GFR calc Af Amer: 86 mL/min/{1.73_m2} (ref >59)
GFR calc non Af Amer: 75 mL/min/{1.73_m2} (ref >59)
Globulin, Total: 2.7 g/dL (ref 1.5–4.5)
Glucose: 94 mg/dL (ref 65–99)
Potassium: 4.4 mmol/L (ref 3.5–5.2)
Sodium: 139 mmol/L (ref 134–144)
Total Protein: 7.6 g/dL (ref 6.0–8.5)

## 2019-05-16 LAB — CBC WITH DIFFERENTIAL/PLATELET
Basophils Absolute: 0.1 10*3/uL (ref 0.0–0.2)
Basos: 1 %
EOS (ABSOLUTE): 0.1 10*3/uL (ref 0.0–0.4)
Eos: 2 %
Hematocrit: 43.7 % (ref 37.5–51.0)
Hemoglobin: 15.1 g/dL (ref 13.0–17.7)
Immature Grans (Abs): 0 10*3/uL (ref 0.0–0.1)
Immature Granulocytes: 1 %
Lymphocytes Absolute: 2.2 10*3/uL (ref 0.7–3.1)
Lymphs: 36 %
MCH: 28.4 pg (ref 26.6–33.0)
MCHC: 34.6 g/dL (ref 31.5–35.7)
MCV: 82 fL (ref 79–97)
Monocytes Absolute: 0.4 10*3/uL (ref 0.1–0.9)
Monocytes: 7 %
Neutrophils Absolute: 3.2 10*3/uL (ref 1.4–7.0)
Neutrophils: 53 %
Platelets: 217 10*3/uL (ref 150–450)
RBC: 5.32 x10E6/uL (ref 4.14–5.80)
RDW: 13 % (ref 11.6–15.4)
WBC: 6 10*3/uL (ref 3.4–10.8)

## 2019-05-16 LAB — HIV ANTIBODY (ROUTINE TESTING W REFLEX): HIV Screen 4th Generation wRfx: NONREACTIVE

## 2019-05-16 LAB — HEMOGLOBIN A1C
Est. average glucose Bld gHb Est-mCnc: 105 mg/dL
Hgb A1c MFr Bld: 5.3 % (ref 4.8–5.6)

## 2019-05-16 LAB — LIPID PANEL
Chol/HDL Ratio: 4.4 ratio (ref 0.0–5.0)
Cholesterol, Total: 190 mg/dL (ref 100–199)
HDL: 43 mg/dL (ref >39)
LDL Chol Calc (NIH): 120 mg/dL — ABNORMAL HIGH (ref 0–99)
Triglycerides: 153 mg/dL — ABNORMAL HIGH (ref 0–149)
VLDL Cholesterol Cal: 27 mg/dL (ref 5–40)

## 2019-05-16 LAB — TSH: TSH: 2.69 u[IU]/mL (ref 0.450–4.500)

## 2019-05-16 LAB — VITAMIN D 25 HYDROXY (VIT D DEFICIENCY, FRACTURES): Vit D, 25-Hydroxy: 24.4 ng/mL — ABNORMAL LOW (ref 30.0–100.0)

## 2019-05-16 NOTE — Telephone Encounter (Signed)
-----   Message from Margaretann Loveless, [place] sent at 05/16/2019 11:27 AM EDT ----- Blood count is normal. Kidney and liver function are normal. Sugar/A1c is normal. Cholesterol has improved compared to last year. Thyroid is normal. HIV screen done once in a lifetime, unless exposed, is negative. Vit D is lower at 24.4. Would recommend OTC Vit D supplement of 1000-2000 IU daily.

## 2019-05-16 NOTE — Telephone Encounter (Signed)
LMTCB 05/16/2019.  PEC please advise pt below.    Thanks,   -Vernona Rieger

## 2019-05-16 NOTE — Telephone Encounter (Signed)
Pt notified of lab message and is willing to try a OTC vitamin D 1000 to 2000 IU's daily.

## 2019-05-18 ENCOUNTER — Other Ambulatory Visit: Payer: Self-pay

## 2019-05-18 ENCOUNTER — Ambulatory Visit: Payer: Commercial Managed Care - PPO

## 2019-05-18 DIAGNOSIS — R2689 Other abnormalities of gait and mobility: Secondary | ICD-10-CM

## 2019-05-18 DIAGNOSIS — M545 Low back pain, unspecified: Secondary | ICD-10-CM

## 2019-05-18 DIAGNOSIS — M6281 Muscle weakness (generalized): Secondary | ICD-10-CM

## 2019-05-18 DIAGNOSIS — G8929 Other chronic pain: Secondary | ICD-10-CM

## 2019-05-19 NOTE — Therapy (Signed)
[place] MAIN [facility] SERVICES [address] [place], [place], [address] Phone: [phone]   Fax:  [phone]  Physical Therapy Treatment  Patient Details  Name: Eric Lane MRN: [id number] Date of Birth: 07/14/1980 Referring Provider (PT): Dr. Saintclair Halsted   Encounter Date: 05/18/2019  PT End of Session - 05/19/19 0748    Visit Number  29    Number of Visits  33    Date for PT Re-Evaluation  06/14/19    Authorization Type  9/10 PN 04/04/19    PT Start Time  1638    PT Stop Time  1720    PT Time Calculation (min)  42 min    Activity Tolerance  Patient tolerated treatment well    Behavior During Therapy  [facility] for tasks assessed/performed       History reviewed. No pertinent past medical history.  Past Surgical History:  Procedure Laterality Date  . EYE SURGERY    . LASIK Bilateral 09/20/2014    There were no vitals filed for this visit.  Subjective Assessment - 05/19/19 0747    Subjective  Patient reports having a rough week, mixed up his times today. No falls or LOB, no pain.    Pertinent History  Patient received surgery on 11/14/18 for grade I spondylolisthesis L5-S1 with severe spinal stenosis bilateral L5 radiculopathies left greater than right, he underwent PLIF L5-S1. Has seen this therapist earlier this year prior to this surgery and COVID-19 outbreak. Per patient no extension, no sit ups, is ok to do planks. Patient presents in back brace, is supposed to be in for 2 months s/p surgery.  Tingling numbness in big toe of right side. Returned back to work Monday. More sore in the middle of the night.    Limitations  Standing;Walking;Sitting;Lifting;House hold activities    How long can you sit comfortably?  20-30 minutes    How long can you stand comfortably?  hasn't stood still for long duration    How long can you walk comfortably?  has walked 3 miles    Diagnostic tests  s/p surgery    Patient Stated Goals  improve gait/strength, return  to biking, golfing, running    Currently in Pain?  No/denies             Supine:  Hamstring stretch, leg on PT shoulder for optimal overpressure with increasing range with repetition, 60 seconds each LE, x 2 sets. Popliteal angle lengthening with PT overpressure 45 seconds x2 trials each LE Piriformis lengthening 2x 60 seconds each LE with PT overpressure Bridging with posterior pelvic tilt and clamshell against BTB at top 12x   Standing: Lateral squat walks with focus on controlled trunk and depth of squat 2x 30 ft  Forward walking lunges with 8lb weight overhead: cues for arms in for core activation 2x 30 ft  Wall lateral/side plank modifications 60 seconds each side, min A for finding proper neutral alignment  Sitting on heels; glute squeeze to tall kneeling with 8lb overhead press 12x  Prone Mobilizations grade II-III for thoracic region, UPAs and CPAs with hypomobility noted bilaterally with R>L. Reduced with repetition x 5 minutes   J inferior mobilization for decreased kyphosis grade II-III 10 seconds x 3 sets Roller to paraspinals (thoracic and lumbar) x 3 minutes         Pt educated throughout session about proper posture and technique with exercises. Improved exercise technique, movement at target joints, use of target muscles after min to  mod verbal, visual, tactile cues                 PT Education - 05/19/19 0748    Education Details  exercise technique, body mechanics    Person(s) Educated  Patient    Methods  Explanation;Demonstration;Tactile cues;Verbal cues    Comprehension  Verbalized understanding;Returned demonstration;Verbal cues required;Tactile cues required       PT Short Term Goals - 04/19/19 2044      PT SHORT TERM GOAL #1   Title  Pt will be independent with HEP in order to improve strength and decrease back pain in order to improve pain-free function at home and work.     Baseline  10/29: given 12/14: HEP compliant    Time  4     Period  Weeks    Status  Achieved    Target Date  01/26/19      PT SHORT TERM GOAL #2   Title  Patient will perform either home or gym workout session 3x/week for return to PLOF and strength.    Baseline  2/17: initiating increase of home/gym protocol with decrease of PT sessions    Time  4    Period  Weeks    Status  New    Target Date  05/17/19        PT Long Term Goals - 04/19/19 2045      PT LONG TERM GOAL #1   Title  Patient will reduce modified Oswestry score to <20 as to demonstrate minimal disability with ADLs including improved sleeping tolerance, walking/sitting tolerance etc for better mobility with ADLs.    Baseline  10/29: 36% 12/14: 30 % 2/3: 20%    Time  8    Period  Weeks    Status  Partially Met    Target Date  06/14/19      PT LONG TERM GOAL #2   Title  Patient will increase BLE gross strength to 4+/5 as to improve functional strength for independent gait, increased standing tolerance and increased ADL ability.    Baseline  10/29: LLE grossly 3+/5 RLE 4-/5 12/14: LLE hip flex, adduct, and extension 4-/5, abduction and knees 4/5 RLE 4/5 2/3: R 4+/5 L 4/5 with hip flex and extension 4+/5    Time  8    Period  Weeks    Status  Partially Met    Target Date  06/14/19      PT LONG TERM GOAL #3   Title  Patient (< 59 years old) will complete five times sit to stand test in < 10 seconds indicating an increased LE strength and improved balance.    Baseline  10/29: 13 seconds 12/14: 9 seconds    Time  8    Period  Weeks    Status  Achieved      PT LONG TERM GOAL #4   Title  Patient will report average VAS of 1/10 to improve tolerance with ADLs and reduced symptoms with activities.    Baseline  10/29: 3/10 12/14: 3/10 2/3: 2/10 2/17: 1-2 /10    Time  8    Period  Weeks    Status  Partially Met    Target Date  06/14/19      PT LONG TERM GOAL #5   Title  Patient will tolerate gym program within protocol weight limit including: spin bike, weight program, and  cardio to return to PLOF.    Baseline  12/24: has not started gym program  yet at this time 2/2: able to do some modified exercises still very limited. 2/17: unable to spin > 10 minutes, initiating return to light weight program    Time  8    Period  Weeks    Status  Partially Met    Target Date  06/14/19      Additional Long Term Goals   Additional Long Term Goals  Yes      PT LONG TERM GOAL #6   Title  Patient will return to modified golfing as well as return to cycling > 20 minutes on bike for functional return to sport with compliance to protocol for return to PLOF.    Baseline  2/17; unable    Time  8    Period  Weeks    Status  New    Target Date  06/14/19            Plan - 05/19/19 0749    Clinical Impression Statement  Patient presents with excellent motivation.  He continues to progress with functional mobility and strength for return to PLOF and sport however upper chain tension limits full rotational patterns at this time. Patient would benefit from further skilled PT intervention to improve strength and mobility under surgeon protocol in order to decrease pain and increase QOL    Personal Factors and Comorbidities  Comorbidity 2    Comorbidities  HTN, GERD, hypercholesteremia, hypotestosteronism, cardiac murmur,    Examination-Activity Limitations  Bed Mobility;Bathing;Bend;Caring for Others;Carry;Hygiene/Grooming;Stairs;Squat;Reach Overhead;Locomotion Level;Lift;Stand;Toileting;Transfers    Examination-Participation Restrictions  Church;Cleaning;Community Activity;Interpersonal Relationship;Laundry;Volunteer;Shop;Meal Prep;Yard Work    [employer]chant [employer] officer  Evolving/Moderate complexity    Rehab Potential  Good    PT Frequency  1x / week    PT Duration  8 weeks    PT Treatment/Interventions  ADLs/Self Care Home Management;Biofeedback;Aquatic Therapy;Canalith Repostioning;Cryotherapy;Electrical Stimulation;Iontophoresis 59m/ml Dexamethasone;Moist  Heat;Traction;Ultrasound;DME Instruction;Gait training;Stair training;Functional mobility training;Therapeutic activities;Therapeutic exercise;Balance training;Neuromuscular re-education;Patient/family education;Manual techniques;Passive range of motion;Dry needling;Energy conservation;Taping;Vestibular;Scar mobilization;Compression bandaging    PT Next Visit Plan  1x/week    PT Home Exercise Plan  see above    Consulted and Agree with Plan of Care  Patient       Patient will benefit from skilled therapeutic intervention in order to improve the following deficits and impairments:  Pain, Postural dysfunction, Decreased strength, Difficulty walking, Abnormal gait, Decreased activity tolerance, Decreased endurance, Decreased scar mobility, Decreased mobility, Decreased range of motion, Hypomobility, Impaired flexibility, Increased muscle spasms, Impaired sensation, Improper body mechanics  Visit Diagnosis: Muscle weakness (generalized)  Other abnormalities of gait and mobility  Chronic midline low back pain, unspecified whether sciatica present     Problem List Patient Active Problem List   Diagnosis Date Noted  . Spondylolisthesis at L5-S1 level 11/14/2018  . Essential hypertension 03/30/2018  . CN (constipation) 08/15/2014  . Elevated blood sugar 08/15/2014  . GERD (gastroesophageal reflux disease) 08/15/2014  . External hemorrhoid 08/15/2014  . Hypercholesteremia 08/15/2014  . Hypotestosteronism 08/15/2014  . Cardiac murmur 08/15/2014  . Awareness of heartbeats 08/15/2014  . Avitaminosis D 08/15/2014   MJanna Arch PT, DPT   05/19/2019, 7:50 AM  [place] [facility] [address]RT[address] [place] [address]Phone: [phone]  Fax:  [phone] Name: JMAVERIK FOOTMRN: [id number] of Birth: 104-28-1982

## 2019-05-23 ENCOUNTER — Other Ambulatory Visit: Payer: Self-pay

## 2019-05-23 ENCOUNTER — Ambulatory Visit: Payer: Commercial Managed Care - PPO

## 2019-05-23 DIAGNOSIS — R2689 Other abnormalities of gait and mobility: Secondary | ICD-10-CM

## 2019-05-23 DIAGNOSIS — G8929 Other chronic pain: Secondary | ICD-10-CM

## 2019-05-23 DIAGNOSIS — M545 Low back pain, unspecified: Secondary | ICD-10-CM

## 2019-05-23 DIAGNOSIS — M6281 Muscle weakness (generalized): Secondary | ICD-10-CM

## 2019-05-23 NOTE — Therapy (Signed)
[place] MAIN [facility] SERVICES [address] [place], [place], [address] Phone: [phone]   Fax:  [phone]  Physical Therapy Treatment Physical Therapy Progress Note   Dates of reporting period  04/04/19   to   05/23/19   Patient Details  Name: Eric Lane MRN: [id number] Date of Birth: [birth year] Referring Provider (PT): Dr. Saintclair Halsted   Encounter Date: 05/23/2019  PT End of Session - 05/23/19 1645    Visit Number  30    Number of Visits  33    Date for PT Re-Evaluation  06/14/19    Authorization Type  10/10 PN 04/04/19; next session 1/10 Pn 05/23/19    PT Start Time  1645    PT Stop Time  1731    PT Time Calculation (min)  46 min    Activity Tolerance  Patient tolerated treatment well    Behavior During Therapy  [facility] for tasks assessed/performed       History reviewed. No pertinent past medical history.  Past Surgical History:  Procedure Laterality Date  . EYE SURGERY    . LASIK Bilateral 09/20/2014    There were no vitals filed for this visit.  Subjective Assessment - 05/24/19 0943    Subjective  Patient reports compliance with HEP. Hasn't attempted golf yet due to busy schedule.    Pertinent History  Patient received surgery on 11/14/18 for grade I spondylolisthesis L5-S1 with severe spinal stenosis bilateral L5 radiculopathies left greater than right, he underwent PLIF L5-S1. Has seen this therapist earlier this year prior to this surgery and COVID-19 outbreak. Per patient no extension, no sit ups, is ok to do planks. Patient presents in back brace, is supposed to be in for 2 months s/p surgery.  Tingling numbness in big toe of right side. Returned back to work Monday. More sore in the middle of the night.    Limitations  Standing;Walking;Sitting;Lifting;House hold activities    How long can you sit comfortably?  20-30 minutes    How long can you stand comfortably?  hasn't stood still for long duration    How long can you walk  comfortably?  has walked 3 miles    Diagnostic tests  s/p surgery    Patient Stated Goals  improve gait/strength, return to biking, golfing, running    Currently in Pain?  No/denies      goals:  MODI: 8%    Stretching protocol (adding to HEP)  Prone Press ups 3x10 5 second holds  Pidgeon pose 30-60 sec per side; modified due to limited muscle tissue length  On knees, side bend 30-60 seconds per side x 2 each side Child pose 1-3 min alternating into quadruped and back to child pose Frog pose (modified supine)  1-3 min   Pre-golfing standing stretches: Standing hamstring stretch holding onto wall/surface 30 seconds x2 trials each LE Standing hip flexor stretch 30 second holds each LE  Standing IT band stretch 30 seconds each LE Lateral abductor/adductor stretch 30 seconds   Prone Mobilizations grade II-III for thoracic region, UPAs and CPAs with hypomobility noted bilaterally with R>L. Reduced with repetition x 5 minutes J inferior mobilization for decreased kyphosis grade II-III 10 seconds x 3 sets  STM with implementation of effleurage and pettrisage for left and right  lumbar paraspinal and lateral musculature. Noted tenderness with muscle adhesions along top of iliac crest. x9 minutes    Patient's condition has the potential to improve in response to therapy. Maximum improvement is  yet to be obtained. The anticipated improvement is attainable and reasonable in a generally predictable time.  Patient reports he is feeling closer to his normal but is still not able to lift things without having to be careful and hasn't attempted golfing yet.                   PT Education - 05/23/19 1644    Education Details  goals, exercise technique, body mechanics    Person(s) Educated  Patient    Methods  Explanation;Demonstration;Tactile cues;Verbal cues    Comprehension  Verbalized understanding;Returned demonstration;Verbal cues required;Tactile cues required        PT Short Term Goals - 05/24/19 0959      PT SHORT TERM GOAL #1   Title  Pt will be independent with HEP in order to improve strength and decrease back pain in order to improve pain-free function at home and work.     Baseline  10/29: given 12/14: HEP compliant    Time  4    Period  Weeks    Status  Achieved    Target Date  01/26/19      PT SHORT TERM GOAL #2   Title  Patient will perform either home or gym workout session 3x/week for return to PLOF and strength.    Baseline  2/17: initiating increase of home/gym protocol with decrease of PT sessions 3/24: 2x/ week    Time  4    Period  Weeks    Status  Partially Met    Target Date  06/07/19        PT Long Term Goals - 05/24/19 1000      PT LONG TERM GOAL #1   Title  Patient will reduce modified Oswestry score to <20 as to demonstrate minimal disability with ADLs including improved sleeping tolerance, walking/sitting tolerance etc for better mobility with ADLs.    Baseline  10/29: 36% 12/14: 30 % 2/3: 20% 3/24: 8%    Time  8    Period  Weeks    Status  Achieved      PT LONG TERM GOAL #2   Title  Patient will increase BLE gross strength to 4+/5 as to improve functional strength for independent gait, increased standing tolerance and increased ADL ability.    Baseline  10/29: LLE grossly 3+/5 RLE 4-/5 12/14: LLE hip flex, adduct, and extension 4-/5, abduction and knees 4/5 RLE 4/5 2/3: R 4+/5 L 4/5 with hip flex and extension 4+/5 3/23: L 4/5 R 4+/5    Time  8    Period  Weeks    Status  Partially Met    Target Date  06/14/19      PT LONG TERM GOAL #3   Title  Patient (< 47 years old) will complete five times sit to stand test in < 10 seconds indicating an increased LE strength and improved balance.    Baseline  10/29: 13 seconds 12/14: 9 seconds    Time  8    Period  Weeks    Status  Achieved      PT LONG TERM GOAL #4   Title  Patient will report average VAS of 1/10 to improve tolerance with ADLs and reduced symptoms  with activities.    Baseline  10/29: 3/10 12/14: 3/10 2/3: 2/10 2/17: 1-2 /10 3/24: 1-2/10    Time  8    Period  Weeks    Status  Partially Met    Target Date  06/14/19      PT LONG TERM GOAL #5   Title  Patient will tolerate gym program within protocol weight limit including: spin bike, weight program, and cardio to return to PLOF.    Baseline  12/24: has not started gym program yet at this time 2/2: able to do some modified exercises still very limited. 2/17: unable to spin > 10 minutes, initiating return to light weight program 3/24: tolerates 2x/week    Time  8    Period  Weeks    Status  Partially Met    Target Date  06/14/19      PT LONG TERM GOAL #6   Title  Patient will return to modified golfing as well as return to cycling > 20 minutes on bike for functional return to sport with compliance to protocol for return to PLOF.    Baseline  2/17; unable 3/23: has not attempted yet    Time  8    Period  Weeks    Status  On-going    Target Date  06/14/19            Plan - 05/24/19 1003    Clinical Impression Statement  Patient is progressing towards functional goals. He is able to perform more tasks around home with decreased pain however has not yet been able to attempt a 9 hold round of golf for return to sport training. Is able to tolerate gentle progression of working out independently. Patient's condition has the potential to improve in response to therapy. Maximum improvement is yet to be obtained. The anticipated improvement is attainable and reasonable in a generally predictable time. Patient would benefit from further skilled PT intervention to improve strength and mobility under surgeon protocol in order to decrease pain and increase QOL    Personal Factors and Comorbidities  Comorbidity 2    Comorbidities  HTN, GERD, hypercholesteremia, hypotestosteronism, cardiac murmur,    Examination-Activity Limitations  Bed Mobility;Bathing;Bend;Caring for  Others;Carry;Hygiene/Grooming;Stairs;Squat;Reach Overhead;Locomotion Level;Lift;Stand;Toileting;Transfers    Examination-Participation Restrictions  Church;Cleaning;Community Activity;Interpersonal Relationship;Laundry;Volunteer;Shop;Meal Prep;Yard Work    [employer] officer  Evolving/Moderate complexity    Rehab Potential  Good    PT Frequency  1x / week    PT Duration  8 weeks    PT Treatment/Interventions  ADLs/Self Care Home Management;Biofeedback;Aquatic Therapy;Canalith Repostioning;Cryotherapy;Electrical Stimulation;Iontophoresis 32m/ml Dexamethasone;Moist Heat;Traction;Ultrasound;DME Instruction;Gait training;Stair training;Functional mobility training;Therapeutic activities;Therapeutic exercise;Balance training;Neuromuscular re-education;Patient/family education;Manual techniques;Passive range of motion;Dry needling;Energy conservation;Taping;Vestibular;Scar mobilization;Compression bandaging    PT Next Visit Plan  1x/week    PT Home Exercise Plan  see above    Consulted and Agree with Plan of Care  Patient       Patient will benefit from skilled therapeutic intervention in order to improve the following deficits and impairments:  Pain, Postural dysfunction, Decreased strength, Difficulty walking, Abnormal gait, Decreased activity tolerance, Decreased endurance, Decreased scar mobility, Decreased mobility, Decreased range of motion, Hypomobility, Impaired flexibility, Increased muscle spasms, Impaired sensation, Improper body mechanics  Visit Diagnosis: Muscle weakness (generalized)  Other abnormalities of gait and mobility  Chronic midline low back pain, unspecified whether sciatica present     Problem List Patient Active Problem List   Diagnosis Date Noted  . Spondylolisthesis at L5-S1 level 11/14/2018  . Essential hypertension 03/30/2018  . CN (constipation) 08/15/2014  . Elevated blood sugar 08/15/2014  . GERD (gastroesophageal reflux disease) 08/15/2014   . External hemorrhoid 08/15/2014  . Hypercholesteremia 08/15/2014  . Hypotestosteronism 08/15/2014  . Cardiac murmur 08/15/2014  . Awareness of heartbeats 08/15/2014  .  Avitaminosis D 08/15/2014   Janna Arch, PT, DPT   05/24/2019, 10:04 AM  [place] MAIN [facility] SERVICES [address] [place], [place], [address] Phone: [phone]   Fax:  [phone]  Name: TYSIN SALADA MRN: [id number] Date of Birth: 07-Aug-1980

## 2019-05-30 ENCOUNTER — Other Ambulatory Visit: Payer: Self-pay

## 2019-05-30 ENCOUNTER — Ambulatory Visit: Payer: Commercial Managed Care - PPO

## 2019-05-30 DIAGNOSIS — M6281 Muscle weakness (generalized): Secondary | ICD-10-CM

## 2019-05-30 DIAGNOSIS — G8929 Other chronic pain: Secondary | ICD-10-CM

## 2019-05-30 DIAGNOSIS — R2689 Other abnormalities of gait and mobility: Secondary | ICD-10-CM

## 2019-05-31 NOTE — Therapy (Signed)
[place] MAIN [facility] SERVICES [address] [place], [place], [address] Phone: [phone]   Fax:  [phone]  Physical Therapy Treatment  Patient Details  Name: Eric Lane MRN: [id number] Date of Birth: 1980-11-24 Referring Provider (PT): Dr. Saintclair Halsted   Encounter Date: 05/30/2019  PT End of Session - 05/31/19 0906    Visit Number  31    Number of Visits  33    Date for PT Re-Evaluation  06/14/19    Authorization Type  1/10 Pn 05/23/19    PT Start Time  1645    PT Stop Time  1731    PT Time Calculation (min)  46 min    Activity Tolerance  Patient tolerated treatment well    Behavior During Therapy  [facility] for tasks assessed/performed       History reviewed. No pertinent past medical history.  Past Surgical History:  Procedure Laterality Date  . EYE SURGERY    . LASIK Bilateral 09/20/2014    There were no vitals filed for this visit.  Subjective Assessment - 05/31/19 0902    Subjective  Patient reports he was able to put, chip, and drive sunday and monday. Reports stiffness in upper thoracic but no pain in surgical site. Had a massage last week.    Pertinent History  Patient received surgery on 11/14/18 for grade I spondylolisthesis L5-S1 with severe spinal stenosis bilateral L5 radiculopathies left greater than right, he underwent PLIF L5-S1. Has seen this therapist earlier this year prior to this surgery and COVID-19 outbreak. Per patient no extension, no sit ups, is ok to do planks. Patient presents in back brace, is supposed to be in for 2 months s/p surgery.  Tingling numbness in big toe of right side. Returned back to work Monday. More sore in the middle of the night.    Limitations  Standing;Walking;Sitting;Lifting;House hold activities    How long can you sit comfortably?  20-30 minutes    How long can you stand comfortably?  hasn't stood still for long duration    How long can you walk comfortably?  has walked 3 miles    Diagnostic  tests  s/p surgery    Patient Stated Goals  improve gait/strength, return to biking, golfing, running    Currently in Pain?  No/denies       Reviewed golf swing video footage : educated on need for TrA activation and alignment between shoulders and hips for reduced hinge and torsion. No falls     Prone Mobilizations grade II-III for thoracic region, UPAs and CPAs with hypomobility noted bilaterally with R>L. Reduced with repetition x 5 minutes J inferior mobilization for decreased kyphosis grade II-III 10 seconds x 3 sets  Seated: Thoracic extension against rolled up towel 15x   Standing:  RTB resisted end swing for carryover to golf swing, cueing for keeping hips and shoulder in line for decreased hinging of spine with rotation. Performed both sides, 10x  Supine:Heat pad under thoracic spine:  Posterior pelvic tilt: 10x 3 second holds, educated on need for compliance with Hep due to noticeable tremor from fatigue.  Posterior pelvic tilt BTB abduction 15x Posterior pelvic tilt SLR raise 10x each LE Posterior pelvic tilt with TrA activation and bilateral knee raise, one at a time then back down out of tabletop position 10x Hamstring stretch, leg on PT shoulder for optimal overpressure with increasing range with repetition, 60 seconds each LE, x 2 sets. Popliteal angle lengthening with PT overpressure 45  seconds x2 trials each LE Piriformis lengthening 2x 60 seconds each LE with PT overpressure  Foam roller education for compliance during gym sessions.      Pt educated throughout session about proper posture and technique with exercises. Improved exercise technique, movement at target joints, use of target muscles after min to mod verbal, visual, tactile cues                PT Education - 05/31/19 0905    Education Details  exercise technique, body mechanics    Person(s) Educated  Patient    Methods  Explanation;Demonstration;Tactile cues;Verbal cues     Comprehension  Verbalized understanding;Returned demonstration;Verbal cues required;Tactile cues required       PT Short Term Goals - 05/24/19 0959      PT SHORT TERM GOAL #1   Title  Pt will be independent with HEP in order to improve strength and decrease back pain in order to improve pain-free function at home and work.     Baseline  10/29: given 12/14: HEP compliant    Time  4    Period  Weeks    Status  Achieved    Target Date  01/26/19      PT SHORT TERM GOAL #2   Title  Patient will perform either home or gym workout session 3x/week for return to PLOF and strength.    Baseline  2/17: initiating increase of home/gym protocol with decrease of PT sessions 3/24: 2x/ week    Time  4    Period  Weeks    Status  Partially Met    Target Date  06/07/19        PT Long Term Goals - 05/24/19 1000      PT LONG TERM GOAL #1   Title  Patient will reduce modified Oswestry score to <20 as to demonstrate minimal disability with ADLs including improved sleeping tolerance, walking/sitting tolerance etc for better mobility with ADLs.    Baseline  10/29: 36% 12/14: 30 % 2/3: 20% 3/24: 8%    Time  8    Period  Weeks    Status  Achieved      PT LONG TERM GOAL #2   Title  Patient will increase BLE gross strength to 4+/5 as to improve functional strength for independent gait, increased standing tolerance and increased ADL ability.    Baseline  10/29: LLE grossly 3+/5 RLE 4-/5 12/14: LLE hip flex, adduct, and extension 4-/5, abduction and knees 4/5 RLE 4/5 2/3: R 4+/5 L 4/5 with hip flex and extension 4+/5 3/23: L 4/5 R 4+/5    Time  8    Period  Weeks    Status  Partially Met    Target Date  06/14/19      PT LONG TERM GOAL #3   Title  Patient (< 38 years old) will complete five times sit to stand test in < 10 seconds indicating an increased LE strength and improved balance.    Baseline  10/29: 13 seconds 12/14: 9 seconds    Time  8    Period  Weeks    Status  Achieved      PT LONG TERM  GOAL #4   Title  Patient will report average VAS of 1/10 to improve tolerance with ADLs and reduced symptoms with activities.    Baseline  10/29: 3/10 12/14: 3/10 2/3: 2/10 2/17: 1-2 /10 3/24: 1-2/10    Time  8    Period  Weeks  Status  Partially Met    Target Date  06/14/19      PT LONG TERM GOAL #5   Title  Patient will tolerate gym program within protocol weight limit including: spin bike, weight program, and cardio to return to PLOF.    Baseline  12/24: has not started gym program yet at this time 2/2: able to do some modified exercises still very limited. 2/17: unable to spin > 10 minutes, initiating return to light weight program 3/24: tolerates 2x/week    Time  8    Period  Weeks    Status  Partially Met    Target Date  06/14/19      PT LONG TERM GOAL #6   Title  Patient will return to modified golfing as well as return to cycling > 20 minutes on bike for functional return to sport with compliance to protocol for return to PLOF.    Baseline  2/17; unable 3/23: has not attempted yet    Time  8    Period  Weeks    Status  On-going    Target Date  06/14/19            Plan - 05/31/19 0907    Clinical Impression Statement  Patient presents to physical therapy with increased stiffness of thoracic spine potentially due to return to sport exercise. Stiffness improved with manual and therex by end of session. Patient tolerated partial return to sport however had not yet attempted 9 holes yet. Will need to complete 9 holes prior to d/c for full return to PLOF and return to sport. Patient would benefit from further skilled PT intervention to improve strength and mobility under surgeon protocol in order to decrease pain and increase QOL    Personal Factors and Comorbidities  Comorbidity 2    Comorbidities  HTN, GERD, hypercholesteremia, hypotestosteronism, cardiac murmur,    Examination-Activity Limitations  Bed Mobility;Bathing;Bend;Caring for  Others;Carry;Hygiene/Grooming;Stairs;Squat;Reach Overhead;Locomotion Level;Lift;Stand;Toileting;Transfers    Examination-Participation Restrictions  Church;Cleaning;Community Activity;Interpersonal Relationship;Laundry;Volunteer;Shop;Meal Prep;Yard Work    [employer] officer  Evolving/Moderate complexity    Rehab Potential  Good    PT Frequency  1x / week    PT Duration  8 weeks    PT Treatment/Interventions  ADLs/Self Care Home Management;Biofeedback;Aquatic Therapy;Canalith Repostioning;Cryotherapy;Electrical Stimulation;Iontophoresis 61m/ml Dexamethasone;Moist Heat;Traction;Ultrasound;DME Instruction;Gait training;Stair training;Functional mobility training;Therapeutic activities;Therapeutic exercise;Balance training;Neuromuscular re-education;Patient/family education;Manual techniques;Passive range of motion;Dry needling;Energy conservation;Taping;Vestibular;Scar mobilization;Compression bandaging    PT Next Visit Plan  1x/week    PT Home Exercise Plan  see above    Consulted and Agree with Plan of Care  Patient       Patient will benefit from skilled therapeutic intervention in order to improve the following deficits and impairments:  Pain, Postural dysfunction, Decreased strength, Difficulty walking, Abnormal gait, Decreased activity tolerance, Decreased endurance, Decreased scar mobility, Decreased mobility, Decreased range of motion, Hypomobility, Impaired flexibility, Increased muscle spasms, Impaired sensation, Improper body mechanics  Visit Diagnosis: Muscle weakness (generalized)  Other abnormalities of gait and mobility  Chronic midline low back pain, unspecified whether sciatica present     Problem List Patient Active Problem List   Diagnosis Date Noted  . Spondylolisthesis at L5-S1 level 11/14/2018  . Essential hypertension 03/30/2018  . CN (constipation) 08/15/2014  . Elevated blood sugar 08/15/2014  . GERD (gastroesophageal reflux disease) 08/15/2014   . External hemorrhoid 08/15/2014  . Hypercholesteremia 08/15/2014  . Hypotestosteronism 08/15/2014  . Cardiac murmur 08/15/2014  . Awareness of heartbeats 08/15/2014  . Avitaminosis D 08/15/2014   MLenda Kelp  Ermalene Postin, PT, DPT   05/31/2019, 9:09 AM  [place] MAIN [facility] SERVICES [address] [place], [place], [address] Phone: [phone]   Fax:  [phone]  Name: Eric Lane MRN: [id number] Date of Birth: [birth year]

## 2019-06-15 ENCOUNTER — Ambulatory Visit: Payer: Commercial Managed Care - PPO | Attending: Neurosurgery

## 2019-06-15 ENCOUNTER — Other Ambulatory Visit: Payer: Self-pay

## 2019-06-15 DIAGNOSIS — R2689 Other abnormalities of gait and mobility: Secondary | ICD-10-CM | POA: Insufficient documentation

## 2019-06-15 DIAGNOSIS — M545 Low back pain: Secondary | ICD-10-CM | POA: Insufficient documentation

## 2019-06-15 DIAGNOSIS — G8929 Other chronic pain: Secondary | ICD-10-CM | POA: Diagnosis present

## 2019-06-15 DIAGNOSIS — M6281 Muscle weakness (generalized): Secondary | ICD-10-CM | POA: Insufficient documentation

## 2019-06-15 NOTE — Therapy (Signed)
[place] MAIN [facility] SERVICES [address] [place], [place], [address] Phone: [phone]   Fax:  [phone]  Physical Therapy Treatment/1xRECERT/Discharge  Patient Details  Name: Eric Lane MRN: [id number] Date of Birth: [birth year] Referring Provider (PT): Dr. Saintclair Halsted   Encounter Date: 06/15/2019  PT End of Session - 06/15/19 1702    Visit Number  32    Number of Visits  33    Date for PT Re-Evaluation  06/15/19    Authorization Type  2/10 Pn 05/23/19    PT Start Time  1600    PT Stop Time  1647    PT Time Calculation (min)  47 min    Activity Tolerance  Patient tolerated treatment well    Behavior During Therapy  [facility] for tasks assessed/performed       History reviewed. No pertinent past medical history.  Past Surgical History:  Procedure Laterality Date  . EYE SURGERY    . LASIK Bilateral 09/20/2014    There were no vitals filed for this visit.  Subjective Assessment - 06/15/19 1700    Subjective  Patient was able to perform a full round (18 holes) of golf with no pain increase during or after. Has been compliant with stretching.    Pertinent History  Patient received surgery on 11/14/18 for grade I spondylolisthesis L5-S1 with severe spinal stenosis bilateral L5 radiculopathies left greater than right, he underwent PLIF L5-S1. Has seen this therapist earlier this year prior to this surgery and COVID-19 outbreak. Per patient no extension, no sit ups, is ok to do planks. Patient presents in back brace, is supposed to be in for 2 months s/p surgery.  Tingling numbness in big toe of right side. Returned back to work Monday. More sore in the middle of the night.    Limitations  Standing;Walking;Sitting;Lifting;House hold activities    How long can you sit comfortably?  20-30 minutes    How long can you stand comfortably?  hasn't stood still for long duration    How long can you walk comfortably?  has walked 3 miles    Diagnostic tests   s/p surgery    Patient Stated Goals  improve gait/strength, return to biking, golfing, running    Currently in Pain?  No/denies             Prone Mobilizations grade II-III for thoracic region, UPAs and CPAs with hypomobility noted bilaterally with R>L. Reduced with repetition x 5 minutes J inferior mobilization for decreased kyphosis grade II-III 10 seconds x 3 sets  STM with implementation of effleurage and pettrisage for left and right  lumbar paraspinal and lateral musculature. Noted tenderness with muscle adhesions along top of iliac crest. x16 minutes  Supine:  Hamstring stretch, leg on PT shoulder for optimal overpressure with increasing range with repetition, 60 seconds each LE, x 2 sets. Popliteal angle lengthening with PT overpressure 45 seconds x2 trials each LE Piriformis lengthening 2x 60 seconds each LE with PT overpressure Single knee to chest 60 seconds each LE, cross body single LE to chest 60 seconds each position  SAD with belt in figure four position 5x 20 second holds    Education on discharge, need for exercising 2-3x/week, need for stretching daily. Patient verbalized understanding, has no further questions.    Patient is ready for discharge at this time, he has returned to sport with no pain increase, is independent in his HEP and demonstrates understanding of self pain control. Today will  be a one time recert for this discharge visit. I will be happy to treat this patient again in the future as needed.             PT Education - 06/15/19 1701    Education Details  exercise technique, body mechanics    Person(s) Educated  Patient    Methods  Explanation;Demonstration;Tactile cues;Verbal cues    Comprehension  Verbalized understanding;Returned demonstration;Verbal cues required;Tactile cues required       PT Short Term Goals - 06/15/19 1704      PT SHORT TERM GOAL #1   Title  Pt will be independent with HEP in order to improve strength and  decrease back pain in order to improve pain-free function at home and work.     Baseline  10/29: given 12/14: HEP compliant    Time  4    Period  Weeks    Status  Achieved    Target Date  01/26/19      PT SHORT TERM GOAL #2   Title  Patient will perform either home or gym workout session 3x/week for return to PLOF and strength.    Baseline  2/17: initiating increase of home/gym protocol with decrease of PT sessions 3/24: 2x/ week 4/15: HEP compliant    Time  4    Period  Weeks    Status  Achieved    Target Date  06/07/19        PT Long Term Goals - 06/15/19 1704      PT LONG TERM GOAL #1   Title  Patient will reduce modified Oswestry score to <20 as to demonstrate minimal disability with ADLs including improved sleeping tolerance, walking/sitting tolerance etc for better mobility with ADLs.    Baseline  10/29: 36% 12/14: 30 % 2/3: 20% 3/24: 8%    Time  8    Period  Weeks    Status  Achieved      PT LONG TERM GOAL #2   Title  Patient will increase BLE gross strength to 4+/5 as to improve functional strength for independent gait, increased standing tolerance and increased ADL ability.    Baseline  10/29: LLE grossly 3+/5 RLE 4-/5 12/14: LLE hip flex, adduct, and extension 4-/5, abduction and knees 4/5 RLE 4/5 2/3: R 4+/5 L 4/5 with hip flex and extension 4+/5 3/23: L 4/5 R 4+/5 4/15: 4+/5    Time  8    Period  Weeks    Status  Achieved      PT LONG TERM GOAL #3   Title  Patient (< 72 years old) will complete five times sit to stand test in < 10 seconds indicating an increased LE strength and improved balance.    Baseline  10/29: 13 seconds 12/14: 9 seconds    Time  8    Period  Weeks    Status  Achieved      PT LONG TERM GOAL #4   Title  Patient will report average VAS of 1/10 to improve tolerance with ADLs and reduced symptoms with activities.    Baseline  10/29: 3/10 12/14: 3/10 2/3: 2/10 2/17: 1-2 /10 3/24: 1-2/10 4/15: 1/10    Time  8    Period  Weeks    Status   Achieved      PT LONG TERM GOAL #5   Title  Patient will tolerate gym program within protocol weight limit including: spin bike, weight program, and cardio to return to PLOF.  Baseline  12/24: has not started gym program yet at this time 2/2: able to do some modified exercises still very limited. 2/17: unable to spin > 10 minutes, initiating return to light weight program 3/24: tolerates 2x/week 4/15: able to workout with little to no restriction, has not returned to biking.    Time  8    Period  Weeks    Status  Partially Met    Target Date  06/15/19      PT LONG TERM GOAL #6   Title  Patient will return to modified golfing as well as return to cycling > 20 minutes on bike for functional return to sport with compliance to protocol for return to PLOF.    Baseline  2/17; unable 3/23: has not attempted yet 4/15: returned to golfing without problem, has not returned to cycle    Time  8    Period  Weeks    Status  Partially Met    Target Date  06/15/19            Plan - 06/15/19 1703    Clinical Impression Statement  Patient is ready for discharge at this time, he has returned to sport with no pain increase, is independent in his HEP and demonstrates understanding of self pain control. Today will be a one time recert for this discharge visit. I will be happy to treat this patient again in the future as needed.    Personal Factors and Comorbidities  Comorbidity 2    Comorbidities  HTN, GERD, hypercholesteremia, hypotestosteronism, cardiac murmur,    Examination-Activity Limitations  Bed Mobility;Bathing;Bend;Caring for Others;Carry;Hygiene/Grooming;Stairs;Squat;Reach Overhead;Locomotion Level;Lift;Stand;Toileting;Transfers    Examination-Participation Restrictions  Church;Cleaning;Community Activity;Interpersonal Relationship;Laundry;Volunteer;Shop;Meal Prep;Yard Work    Stability/Clinical Decision Making  Evolving/Moderate complexity    Rehab Potential  Good    PT Frequency  One time  visit    PT Treatment/Interventions  ADLs/Self Care Home Management;Biofeedback;Aquatic Therapy;Canalith Repostioning;Cryotherapy;Electrical Stimulation;Iontophoresis 45m/ml Dexamethasone;Moist Heat;Traction;Ultrasound;DME Instruction;Gait training;Stair training;Functional mobility training;Therapeutic activities;Therapeutic exercise;Balance training;Neuromuscular re-education;Patient/family education;Manual techniques;Passive range of motion;Dry needling;Energy conservation;Taping;Vestibular;Scar mobilization;Compression bandaging    PT Home Exercise Plan  see above    Consulted and Agree with Plan of Care  Patient       Patient will benefit from skilled therapeutic intervention in order to improve the following deficits and impairments:  Pain, Postural dysfunction, Decreased strength, Difficulty walking, Abnormal gait, Decreased activity tolerance, Decreased endurance, Decreased scar mobility, Decreased mobility, Decreased range of motion, Hypomobility, Impaired flexibility, Increased muscle spasms, Impaired sensation, Improper body mechanics  Visit Diagnosis: Muscle weakness (generalized)  Other abnormalities of gait and mobility  Chronic midline low back pain, unspecified whether sciatica present     Problem List Patient Active Problem List   Diagnosis Date Noted  . Spondylolisthesis at L5-S1 level 11/14/2018  . Essential hypertension 03/30/2018  . CN (constipation) 08/15/2014  . Elevated blood sugar 08/15/2014  . GERD (gastroesophageal reflux disease) 08/15/2014  . External hemorrhoid 08/15/2014  . Hypercholesteremia 08/15/2014  . Hypotestosteronism 08/15/2014  . Cardiac murmur 08/15/2014  . Awareness of heartbeats 08/15/2014  . Avitaminosis D 08/15/2014   MJanna Arch PT, DPT   06/15/2019, 5:08 PM  [facility] [address] [address]Phone: [phone]  Fax:  [phone] Name: Eric SEBOMRN: [id number] of Birth: 108/26/1982

## 2019-06-20 ENCOUNTER — Encounter: Payer: Self-pay | Admitting: Urology

## 2019-06-20 ENCOUNTER — Other Ambulatory Visit: Payer: Self-pay

## 2019-06-20 ENCOUNTER — Ambulatory Visit: Payer: Commercial Managed Care - PPO | Admitting: Urology

## 2019-06-20 VITALS — BP 139/89 | HR 137 | Ht 71.0 in | Wt 224.0 lb

## 2019-06-20 DIAGNOSIS — F524 Premature ejaculation: Secondary | ICD-10-CM

## 2019-06-20 DIAGNOSIS — N4883 Acquired buried penis: Secondary | ICD-10-CM

## 2019-06-20 DIAGNOSIS — N486 Induration penis plastica: Secondary | ICD-10-CM

## 2019-06-20 DIAGNOSIS — Z3009 Encounter for other general counseling and advice on contraception: Secondary | ICD-10-CM

## 2019-06-20 NOTE — Progress Notes (Signed)
06/20/19 11:11 AM   Eric Lane [birth year] [id number]  Referring provider: Mar Daring, PA-C [place],  [place] [address]  Chief Complaint  Patient presents with  . VAS Consult    HPI: Eric Lane is a 39 y.o. M who returns today for the evaluation and management of multiple GU issues.  He reports of change in his testicles and penis size since high school. He weighed 165 pounds during that time and weighs 220 Ibs currently.  He also reports of curvature in his penis to the left onset high school. Denies pain with penetration.  No ED.  No penile pain.  For the past 1-2 years, he reports of quicker ejaculation (3-5 minutes). He has used depressive medications in the past.   This has become bothersome to him and is affecting his relationship.  He is interested in vasectomy later this year. He is married with 2 kids.   PMH: History reviewed. No pertinent past medical history.  Surgical History: Past Surgical History:  Procedure Laterality Date  . BACK SURGERY    . EYE SURGERY    . LASIK Bilateral 09/20/2014    Home Medications:  Allergies as of 06/20/2019   No Known Allergies     Medication List    as of June 20, 2019 11:59 PM   You have not been prescribed any medications.     Allergies: No Known Allergies  Family History: Family History  Problem Relation Age of Onset  . Hypertension Mother   . Hyperlipidemia Father   . Heart attack Father   . Diabetes Maternal Grandmother   . COPD Maternal Grandmother   . Congestive Heart Failure Maternal Grandmother   . Cancer Maternal Grandfather   . Alzheimer's disease Paternal Grandmother   . Stomach cancer Paternal Grandfather     Social History:  reports that he quit smoking about 14 years ago. His smoking use included cigarettes. He has a 5.00 pack-year smoking history. His smokeless tobacco use includes chew. He reports current alcohol use of about 2.0 standard drinks of  alcohol per week. He reports that he does not use drugs.   Physical Exam: BP 139/89   Pulse (!) 137   Ht 5\' 11"  (1.803 m)   Wt 224 lb (101.6 kg)   BMI 31.24 kg/m   Constitutional:  Alert and oriented, No acute distress. HEENT:  AT, moist mucus membranes.  Trachea midline, no masses. Cardiovascular: No clubbing, cyanosis, or edema. Respiratory: Normal respiratory effort, no increased work of breathing. GU: Normal phallus.  Bilateral descended testicles without masses.  Vasa easily palpable bilaterally. No plaque palpated.  No penile plaques appreciated on exam today. Skin: No rashes, bruises or suspicious lesions. Neurologic: Grossly intact, no focal deficits, moving all 4 extremities. Psychiatric: Normal mood and affect.  Laboratory Data: Lab Results  Component Value Date   CREATININE 1.23 05/15/2019    Lab Results  Component Value Date   TESTOSTERONE 193 (L) 03/30/2018    Lab Results  Component Value Date   HGBA1C 5.3 05/15/2019   Assessment & Plan:    1. Peyronie's disease  Explained the pathophysiology of peyronie's disease and needs to be >30 degrees for treatment for Xiaflex.  Alternative treatments were also discussed. Overall his father is nonsignificant thus he prefers conservative management Recommended Vitamin E supplements  On PE, no plaque palpated   2. Premature ejaculation  Recommended lidocaine use on tip or condom usage along with other behavioral techniques  discussed at length Advised to return if increasingly bothersome-alternatives including SSRIs SNRIs discussed on either chronic or as needed basis  3. Vasectomy consult  Today, we discussed what the vas deferens is, where it is located, and its function. We reviewed the procedure for vasectomy, it's risks, benefits, alternatives, and likelihood of achieving his goals. We discussed in detail the procedure, complications, and recovery as well as the need for clearance prior to unprotected  intercourse. We discussed that vasectomy does not protect against sexually transmitted diseases. We discussed that this procedure does not result in immediate sterility and that they would need to use other forms of birth control until he has been cleared with negative postvasectomy semen analyses. I explained that the procedure is considered to be permanent and that attempts at reversal have varying degrees of success. These options include vasectomy reversal, sperm retrieval, and in vitro fertilization; these can be very expensive. We discussed the chance of postvasectomy pain syndrome which occurs in less than 5% of patients. I explained to the patient that there is no treatment to resolve this chronic pain, and that if it developed I would not be able to help resolve the issue, but that surgery is generally not needed for correction. I explained there have even been reports of systemic like illness associated with this chronic pain, and that there was no good cure. I explained that vasectomy it is not a 100% reliable form of birth control, and the risk of pregnancy after vasectomy is approximately 1 in 2000 men who had a negative postvasectomy semen analysis or rare non-motile sperm. I explained that repeat vasectomy was necessary in less than 1% of vasectomy procedures when employing the type of technique that I use. I explained that he should refrain from ejaculation for approximately one week following vasectomy. I explained that there are other options for birth control which are permanent and non-permanent; we discussed these. I explained the rates of surgical complications, such as symptomatic hematoma or infection, are low (1-2%) and vary with the surgeon's experience and criteria used to diagnose the complication.  The patient had the opportunity to ask questions to his stated satisfaction. He voiced understanding of the above factors and stated that he has read all the information provided to him and  the packets and informed consent  4. Acquired buried penis Patient was reassured, penis and testicular to be normal  Procedure decrease in size likely related to weight gain, strongly recommend weight loss for increased penile length   He will call when he is ready to schedule vasectomy, likely in the fall  Return if above interventions fail  [facility] [address], [address] [place], [place] [address] [phone]  I, Donne Hazel, am acting as a scribe for Dr. Vanna Scotland,  I have reviewed the above documentation for accuracy and completeness, and I agree with the above.   Vanna Scotland, MD   I spent 60 total minutes on the day of the encounter including pre-visit review of the medical record, face-to-face time with the patient, and post visit ordering of labs/imaging/tests.

## 2019-06-20 NOTE — Patient Instructions (Signed)

## 2019-06-21 ENCOUNTER — Ambulatory Visit: Payer: Commercial Managed Care - PPO

## 2019-06-21 ENCOUNTER — Ambulatory Visit: Payer: Commercial Managed Care - PPO | Admitting: Urology

## 2019-07-07 ENCOUNTER — Ambulatory Visit (INDEPENDENT_AMBULATORY_CARE_PROVIDER_SITE_OTHER): Payer: Commercial Managed Care - PPO | Admitting: Physician Assistant

## 2019-07-07 ENCOUNTER — Encounter: Payer: Self-pay | Admitting: Physician Assistant

## 2019-07-07 DIAGNOSIS — J0140 Acute pansinusitis, unspecified: Secondary | ICD-10-CM | POA: Diagnosis not present

## 2019-07-07 MED ORDER — AMOXICILLIN-POT CLAVULANATE 875-125 MG PO TABS: 1.0000 | ORAL_TABLET | Freq: Two times a day (BID) | ORAL | 0 refills | Status: DC

## 2019-07-07 NOTE — Progress Notes (Signed)
Virtual telephone visit    Virtual Visit via Telephone Note   This visit type was conducted due to national recommendations for restrictions regarding the COVID-19 Pandemic (e.g. social distancing) in an effort to limit this patient's exposure and mitigate transmission in our community. Due to his co-morbid illnesses, this patient is at least at moderate risk for complications without adequate follow up. This format is felt to be most appropriate for this patient at this time. The patient did not have access to video technology or had technical difficulties with video requiring transitioning to audio format only (telephone). Physical exam was limited to content and character of the telephone converstion.    Patient location: Home Provider location: [facility]   Visit Date: 07/07/2019  Today's healthcare provider: Margaretann Loveless, PA-C   Chief Complaint  Patient presents with  . Sore Throat   Subjective    Sore Throat  This is a new problem. The current episode started 1 to 4 weeks ago (Almost 4 weeks ago and got better worsening in the last weeks). The problem has been unchanged. Neither side of throat is experiencing more pain than the other. There has been no fever. Associated symptoms include congestion, ear pain (sore) and swollen glands. Pertinent negatives include no ear discharge, shortness of breath or trouble swallowing. He has tried cool liquids (Covid test 2 weeks ago, Benadryl) for the symptoms.    3-4 weeks ago woke up in the middle of the night with fever of 101F, body aches and chills, and sore throat. Had sinus symptoms for a week but got better.   Patient Active Problem List   Diagnosis Date Noted  . Spondylolisthesis at L5-S1 level 11/14/2018  . Essential hypertension 03/30/2018  . CN (constipation) 08/15/2014  . Elevated blood sugar 08/15/2014  . GERD (gastroesophageal reflux disease) 08/15/2014  . External hemorrhoid 08/15/2014  . Hypercholesteremia  08/15/2014  . Hypotestosteronism 08/15/2014  . Cardiac murmur 08/15/2014  . Awareness of heartbeats 08/15/2014  . Avitaminosis D 08/15/2014   History reviewed. No pertinent past medical history.    Medications: No outpatient medications prior to visit.   No facility-administered medications prior to visit.    Review of Systems  Constitutional: Negative for fatigue and fever.  HENT: Positive for congestion, ear pain (sore), postnasal drip and sore throat. Negative for ear discharge, sinus pressure, sinus pain, sneezing and trouble swallowing.   Respiratory: Negative for chest tightness, shortness of breath and wheezing.   Cardiovascular: Negative for chest pain, palpitations and leg swelling.    Last CBC Lab Results  Component Value Date   WBC 6.0 05/15/2019   HGB 15.1 05/15/2019   HCT 43.7 05/15/2019   MCV 82 05/15/2019   MCH 28.4 05/15/2019   RDW 13.0 05/15/2019   PLT 217 05/15/2019   Last metabolic panel Lab Results  Component Value Date   GLUCOSE 94 05/15/2019   NA 139 05/15/2019   K 4.4 05/15/2019   CL 100 05/15/2019   CO2 22 05/15/2019   BUN 18 05/15/2019   CREATININE 1.23 05/15/2019   GFRNONAA 75 05/15/2019   GFRAA 86 05/15/2019   CALCIUM 10.3 (H) 05/15/2019   PROT 7.6 05/15/2019   ALBUMIN 4.9 05/15/2019   LABGLOB 2.7 05/15/2019   AGRATIO 1.8 05/15/2019   BILITOT 0.4 05/15/2019   ALKPHOS 89 05/15/2019   AST 20 05/15/2019   ALT 17 05/15/2019   ANIONGAP 7 12/01/2017      Objective    There were no vitals taken  for this visit. BP Readings from Last 3 Encounters:  06/20/19 139/89  05/15/19 136/86  11/15/18 119/76   Wt Readings from Last 3 Encounters:  06/20/19 224 lb (101.6 kg)  05/15/19 227 lb 3.2 oz (103.1 kg)  11/14/18 228 lb 3.2 oz (103.5 kg)        Assessment & Plan     1. Acute non-recurrent pansinusitis Worsening symptoms that have not responded to OTC medications. Will give augmentin as below. Continue allergy medications. Stay  well hydrated and get plenty of rest. Call if no symptom improvement or if symptoms worsen. - amoxicillin-clavulanate (AUGMENTIN) 875-125 MG tablet; Take 1 tablet by mouth 2 (two) times daily.  Dispense: 20 tablet; Refill: 0   No follow-ups on file.    I discussed the assessment and treatment plan with the patient. The patient was provided an opportunity to ask questions and all were answered. The patient agreed with the plan and demonstrated an understanding of the instructions.   The patient was advised to call back or seek an in-person evaluation if the symptoms worsen or if the condition fails to improve as anticipated.  I provided 8 minutes of non-face-to-face time during this encounter.  Reynolds Bowl, PA-C, have reviewed all documentation for this visit. The documentation on 07/12/19 for the exam, diagnosis, procedures, and orders are all accurate and complete.  [place] [facility] [phone] (phone) [phone] (fax)  [place]

## 2019-07-12 ENCOUNTER — Encounter: Payer: Self-pay | Admitting: Physician Assistant

## 2019-08-08 ENCOUNTER — Encounter: Payer: Self-pay | Admitting: Physician Assistant

## 2019-09-11 NOTE — Progress Notes (Addendum)
MyChart Video Visit    Virtual Visit via Video Note   This visit type was conducted due to national recommendations for restrictions regarding the COVID-19 Pandemic (e.g. social distancing) in an effort to limit this patient's exposure and mitigate transmission in our community. This patient is at least at moderate risk for complications without adequate follow up. This format is felt to be most appropriate for this patient at this time. Physical exam was limited by quality of the video and audio technology used for the visit.   Patient location: Work Provider location: [employer]  I discussed the limitations of evaluation and management by telemedicine and the availability of in person appointments. The patient expressed understanding and agreed to proceed.   Patient: Eric Lane   DOB: 1980-03-19   39 y.o. Male  MRN: [id number] Visit Date: 09/12/2019  Today's healthcare provider: Dortha Kern, PA   Chief Complaint  Patient presents with  . Sinus Problem   Subjective    Sinus Problem Associated symptoms include ear pain and swollen glands.    Has had an enlarged left tonsil with some discomfort and left ear feeling stopped up over the past 1-2 months. Some sinus congestion and was treated with Amoxil for a week 1 month ago. Symptoms returned a week after finishing that antibiotic. Has tried allergy meds, Tyleno and Flonase. This helps a little but symptoms continue to return when he stops. Most recent episode starte 10-12 days a go with PND. No fever, cough, dyspnea or loss of sense of taste or smell. Has had both COVID vaccinations over a month ago.  No past medical history on file. Past Surgical History:  Procedure Laterality Date  . BACK SURGERY    . EYE SURGERY    . LASIK Bilateral 09/20/2014   Social History   Tobacco Use  . Smoking status: Former Smoker    Packs/day: 1.00    Years: 5.00    Pack years: 5.00    Types: Cigarettes    Quit date: 03/01/2005     Years since quitting: 14.5  . Smokeless tobacco: Current User    Types: Chew  Substance Use Topics  . Alcohol use: Yes    Alcohol/week: 2.0 standard drinks    Types: 2 Standard drinks or equivalent per week    Comment: OCCASIONALLY  . Drug use: No   Family Status  Relation Name Status  . Mother  Alive  . Father  Alive  . Sister  Alive  . MGM  Alive  . MGF  Deceased  . PGM  Deceased  . PGF  Deceased   No Known Allergies    Medications: Outpatient Medications Prior to Visit  Medication Sig  . amoxicillin-clavulanate (AUGMENTIN) 875-125 MG tablet Take 1 tablet by mouth 2 (two) times daily.   No facility-administered medications prior to visit.    Review of Systems  HENT: Positive for ear pain.       Objective    There were no vitals taken for this visit.  Physical Exam: WDWN male in no apparent distress.  Head: Normocephalic, atraumatic. Neck: Supple, NROM Respiratory: No apparent distress Psych: Normal mood and affect Throat: Slight redness to the left tonsil that is 2-3+ enlarged without exudates.    Assessment & Plan     1. Tonsillitis Recurrent enlarged and reddened left tonsil over the past 10-12 days. Gargle with warm saltwater or mouthwash and treat with antibiotic for 2 weeks. May use allergy treatment and call report  of progress when finished with the antibiotic. Interesting to note wife and daycare children have had some respiratory symptoms intermittently and may be sharing this infection. - amoxicillin (AMOXIL) 875 MG tablet; Take 1 tablet (875 mg total) by mouth 2 (two) times daily.  Dispense: 28 tablet; Refill: 0  2. PND (post-nasal drip) Suspect PND causing part of the stopped up ear sensation with the tonsil partially covering the eustachian tube orifice. Get back on the Claritin or Zyrtec with Flonase - regularly. Call report of progress in 2 weeks.   No follow-ups on file.     I discussed the assessment and treatment plan with the patient.  The patient was provided an opportunity to ask questions and all were answered. The patient agreed with the plan and demonstrated an understanding of the instructions.   The patient was advised to call back or seek an in-person evaluation if the symptoms worsen or if the condition fails to improve as anticipated.  I provided 18 minutes of non-face-to-face time during this encounter.  Haywood Pao, PA, have reviewed all documentation for this visit. The documentation on 09/12/19 for the exam, diagnosis, procedures, and orders are all accurate and complete.   Dortha Kern, PA [facility] [phone] (phone) [phone] (fax)  [facility]

## 2019-09-12 ENCOUNTER — Telehealth (INDEPENDENT_AMBULATORY_CARE_PROVIDER_SITE_OTHER): Payer: Commercial Managed Care - PPO | Admitting: Family Medicine

## 2019-09-12 ENCOUNTER — Encounter: Payer: Self-pay | Admitting: Family Medicine

## 2019-09-12 DIAGNOSIS — J039 Acute tonsillitis, unspecified: Secondary | ICD-10-CM | POA: Diagnosis not present

## 2019-09-12 DIAGNOSIS — R0982 Postnasal drip: Secondary | ICD-10-CM

## 2019-09-12 MED ORDER — AMOXICILLIN 875 MG PO TABS: 875.0000 mg | ORAL_TABLET | Freq: Two times a day (BID) | ORAL | 0 refills | Status: DC

## 2020-01-15 ENCOUNTER — Ambulatory Visit: Payer: Commercial Managed Care - PPO | Admitting: Physician Assistant

## 2020-01-16 ENCOUNTER — Encounter: Payer: Self-pay | Admitting: Physician Assistant

## 2020-01-16 ENCOUNTER — Other Ambulatory Visit: Payer: Self-pay

## 2020-01-16 ENCOUNTER — Ambulatory Visit: Payer: Commercial Managed Care - PPO | Admitting: Physician Assistant

## 2020-01-16 VITALS — BP 154/106 | HR 144 | Ht 71.0 in | Wt 215.0 lb

## 2020-01-16 DIAGNOSIS — N3943 Post-void dribbling: Secondary | ICD-10-CM | POA: Diagnosis not present

## 2020-01-16 LAB — MICROSCOPIC EXAMINATION: Bacteria, UA: NONE SEEN

## 2020-01-16 LAB — URINALYSIS, COMPLETE
Bilirubin, UA: NEGATIVE
Ketones, UA: NEGATIVE
Leukocytes,UA: NEGATIVE
Nitrite, UA: NEGATIVE
RBC, UA: NEGATIVE
Specific Gravity, UA: 1.025 (ref 1.005–1.030)
Urobilinogen, Ur: 0.2 mg/dL (ref 0.2–1.0)
pH, UA: 6 (ref 5.0–7.5)

## 2020-01-16 LAB — BLADDER SCAN AMB NON-IMAGING

## 2020-01-16 NOTE — Progress Notes (Signed)
01/16/2020 10:03 AM   Eric Lane 09/23/1980 [id number]  CC: Chief Complaint  Patient presents with  . Urinary Incontinence    HPI: Eric Lane is a 39 y.o. male with a history of Peyronie's disease and premature ejaculation who presents today for evaluation of post void dribbling.  Today he reports a 1 year history of progressively worsening post-void dribbling associated with a mild urinary urgency.  He states his symptoms began following L5-S1 fusion in September 2020 with Dr. Wynetta Emery.  He denies nocturia, dysuria, gross hematuria, saddle injuries, and history of STIs.  He has no known family history of BPH.  Additionally, he would like to schedule vasectomy from March 2022 as previously discussed with Dr. Apolinar Junes.  IPSS 12/3 as below.  PVR 0 mL.   IPSS    Row Name 01/16/20 1000         International Prostate Symptom Score   How often have you had the sensation of not emptying your bladder? More than half the time     How often have you had to urinate less than every two hours? Less than 1 in 5 times     How often have you found you stopped and started again several times when you urinated? Less than half the time     How often have you found it difficult to postpone urination? About half the time     How often have you had a weak urinary stream? Less than half the time     How often have you had to strain to start urination? Not at All     How many times did you typically get up at night to urinate? None     Total IPSS Score 12       Quality of Life due to urinary symptoms   If you were to spend the rest of your life with your urinary condition just the way it is now how would you feel about that? Mixed            PMH: No past medical history on file.  Surgical History: Past Surgical History:  Procedure Laterality Date  . BACK SURGERY    . EYE SURGERY    . LASIK Bilateral 09/20/2014    Home Medications:  Allergies as of 01/16/2020   No Known Allergies      Medication List       Accurate as of January 16, 2020 10:03 AM. If you have any questions, ask your nurse or doctor.        amoxicillin 875 MG tablet Commonly known as: AMOXIL Take 1 tablet (875 mg total) by mouth 2 (two) times daily.       Allergies:  No Known Allergies  Family History: Family History  Problem Relation Age of Onset  . Hypertension Mother   . Hyperlipidemia Father   . Heart attack Father   . Diabetes Maternal Grandmother   . COPD Maternal Grandmother   . Congestive Heart Failure Maternal Grandmother   . Cancer Maternal Grandfather   . Alzheimer's disease Paternal Grandmother   . Stomach cancer Paternal Grandfather     Social History:   reports that he quit smoking about 14 years ago. His smoking use included cigarettes. He has a 5.00 pack-year smoking history. His smokeless tobacco use includes chew. He reports current alcohol use of about 2.0 standard drinks of alcohol per week. He reports that he does not use drugs.  Physical Exam: BP (!) 154/106 (  BP Location: Left Arm, Patient Position: Sitting, Cuff Size: Large)   Pulse (!) 144   Ht 5\' 11"  (1.803 m)   Wt 215 lb (97.5 kg)   BMI 29.99 kg/m   Constitutional:  Alert and oriented, no acute distress, nontoxic appearing HEENT: [place], AT Cardiovascular: No clubbing, cyanosis, or edema Respiratory: Normal respiratory effort, no increased work of breathing GU: Circumcised penis.  Patent urethral meatus, bilateral descended testicles with intact vasa.  External hemorrhoids, normal sphincter tone, smooth nonenlarged prostate without nodules or induration. Lymph: No inguinal lymphadenopathy Skin: No rashes, bruises or suspicious lesions Neurologic: Grossly intact, no focal deficits, moving all 4 extremities Psychiatric: Normal mood and affect  Laboratory Data: Results for orders placed or performed in visit on 01/16/20  Bladder Scan (Post Void Residual) in office  Result Value Ref Range   Scan Result  64mL    Assessment & Plan:   1. Post-void dribbling 39 year old male with a history of progressively worsening post void dribbling and mild urinary urgency following L5-S1 fusion approximately 14 months ago.  PVR WNL today and physical exam with no significant findings.  Counseled the patient on behavioral changes including taking a relaxing breath following termination of urination to empty the urethra.  Ultimately, I believe there may be a neurogenic component of his symptoms given their onset after spinal surgery.  Recommend pelvic PT assessment for further evaluation.  Patient is in agreement with this plan and would prefer to defer pharmacotherapy at this point.  Pelvic PT referral placed today.  I will have him drop off a urine sample to rule out UTI this week. - Bladder Scan (Post Void Residual) in office - Ambulatory referral to Physical Therapy - Urinalysis, Complete   Return in about 4 months (around 05/15/2020) for Vasectomy with Dr. 05/17/2020.  Apolinar Junes, PA-C  [facility] [address], [address] [place], [place] [id number]

## 2020-01-30 ENCOUNTER — Encounter: Payer: Self-pay | Admitting: Physical Therapy

## 2020-01-30 ENCOUNTER — Ambulatory Visit: Payer: Commercial Managed Care - PPO | Attending: Physician Assistant | Admitting: Physical Therapy

## 2020-01-30 ENCOUNTER — Other Ambulatory Visit: Payer: Self-pay

## 2020-01-30 DIAGNOSIS — R2689 Other abnormalities of gait and mobility: Secondary | ICD-10-CM | POA: Diagnosis present

## 2020-01-30 DIAGNOSIS — M6208 Separation of muscle (nontraumatic), other site: Secondary | ICD-10-CM | POA: Insufficient documentation

## 2020-01-30 DIAGNOSIS — M4125 Other idiopathic scoliosis, thoracolumbar region: Secondary | ICD-10-CM | POA: Insufficient documentation

## 2020-01-30 DIAGNOSIS — M6281 Muscle weakness (generalized): Secondary | ICD-10-CM | POA: Insufficient documentation

## 2020-01-30 DIAGNOSIS — M791 Myalgia, unspecified site: Secondary | ICD-10-CM | POA: Diagnosis present

## 2020-01-30 NOTE — Patient Instructions (Addendum)
Bladder irritant to water 16 fl oz of soda,  32 fl oz  water   Increase water gradually and build cues for intake of water   - 8 fl oz warm temp water first thing in the morning  - 40-64  fl oz tumbler filled to drink at work/ home   ___   Sitting posture, feet on the ground, less slouching, sit on sitting bones    Avoid straining pelvic floor, abdominal muscles , spine  Use log rolling technique instead of getting out of bed with your neck or the sit-up     Log rolling into and out of bed   Log rolling into and out of bed If getting out of bed on R side, Bent knees, scoot hips/ shoulder to L  Raise R arm completely overhead, rolling onto armpit  Then lower bent knees to bed to get into complete side lying position  Then drop legs off bed, and push up onto R elbow/forearm, and use L hand to push onto the bed   ____   Lengthen Back rib by _ shoulder    Lie on   side , pillow between knees  Pull  arm overhead over mattress, grab the edge of mattress,pull it upward, drawing elbow away from ears  Breathing   Open book (handout)  Lying on  _ side , rotating  __ only this week

## 2020-01-31 NOTE — Therapy (Addendum)
[facility] MAIN [facility] [address] [place], [place], [address] Phone: [phone]   Fax:  [phone]  Physical Therapy Evaluation  Patient Details  Name: Eric Lane MRN: [id number] Date of Birth: 1980-08-14 Referring Provider (PT): Vaillancourt    Encounter Date: 01/30/2020   PT End of Session - 01/30/20 1742    Visit Number 1    Number of Visits 10    Date for PT Re-Evaluation 04/09/20    PT Start Time 1600    PT Stop Time 1740    PT Time Calculation (min) 100 min    Activity Tolerance Patient tolerated treatment well;No increased pain    Behavior During Therapy [facility] for tasks assessed/performed           History reviewed. No pertinent past medical history.  Past Surgical History:  Procedure Laterality Date  . BACK SURGERY     Fusion of L5/S1   . EYE SURGERY    . LASIK Bilateral 09/20/2014    There were no vitals filed for this visit.    Subjective Assessment - 01/30/20 1614    Subjective  Pt had fusion lumbar surgery 11/2018 for spondylolisthesis and completed PT for 6 months  Oct-April. Pt noticed after surgery, pt noticed the following Sx:   1) Incomplete Urinary/ fecal emptying:   when urinating, he lost the sensation of completing urination. Pt felt he was finished with urination but he was not. Pt noticed dribbling afterwards. Pt notices it now occuring every time he urinates. Pt urinates in standing position most of the time. Pt finds it harder when standing to finish urination completely. Pt also notices dififculty with completing bowel movements as well. Pt feels he has to go back tot he bathroom after the first time. Denied urine/ fecal leakage, denied urgency.  Pt reports going to urinate 4 x day and pt thinks this is due to limited water intake. Denied nocturia.    2) LBP occurs with working in the yard, lifted something too heavy, shopping. standing for long hours 3-4 hours.  Sciatica on R to calf level  occured after shopping  for 3-4 hours last week  which was the first time he had it since prior to surgery. 1-2/10 pain level.  Hamstring get super tight which was addressed during past PT.  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 Pt [facility] a [facility] [employer] but he has not gone due to time limitations. Pt used to do stationary bike 15-20 min, seated weight machines. Pt has not done sit up / crunches since surgery. Denied fall onto tailbone. Pt is not doing any of his LBP PT exercises consistently , only the hmastring stretches as needed.    3) Peyronie's Disease has been going on prior to surgery and after surgery, pt noticed it more. Denied deivated urine stream, pain to penis/ scrotum, groin, pelvic floor.  Following surgery, pt had issues with ejaculations occuring quicker but that has improved. Pt has had low testosterone levels which pt continue to get monitored.    Pertinent History  Hx of hemorrhoids and constipation in the past prior to surgery and pt thinks it is related to his poor water intake. Denied straining with bowel movements. Daily fluid intake: 32 floz of water, 16 fl oz soda.  Per week: 2 cups of coffee,  4-5 beers.  Hobbies sports: golfing and biking but pt has not returned to road biking.  Biked for 2-3 hours , 20-30 miles per week    Patient Stated Goals address post-void and peyronies issues, return to biking              [facility] PT Assessment - 01/30/20 1654      Assessment   Medical Diagnosis Post void     Referring Provider (PT) Vaillancourt       Precautions   Precautions None      Restrictions   Weight Bearing Restrictions No      Balance Screen   Has the patient fallen in the past 6 months No      Home Environment   Living Environment Private residence      Prior Function   Level of Independence Independent      Observation/Other Assessments   Scoliosis R shoulder lower, T/L convex to R, L patella  higher,       Coordination   Gross Motor Movements are Fluid and Coordinated --   multidifis delayed ( hamstring initated first, then glut) B      Single Leg Stance   Comments trunk pertubations       AROM   Overall AROM Comments --      Strength   Overall Strength Comments DF/ EV 3/5 B, prone hip ext 4/5 w/ pertubation, L hip abd 3+/5, R 4/5          Palpation   Spinal mobility Sideflexion R 46 cm digit III to floor, 48 cm ( post Tx: 46 cm B)     rotation to L ~30%  < R  60% ( PostTx 60 5 B)    SI assessment  iliac crest L higher than R,  ( postTx: iliac cest levelled but R shoulder slightly lower than L still )       Palpation comment hypomobile  T/L junction medial scapula L  ( decreased hypomobility(                        Objective measurements completed on examination: See above findings.     Pelvic Floor Special Questions - 01/30/20 1743    Diastasis Recti 2 fingers width along linea alba    External Perineal Exam limited movement of pelvic floor             [facility] Adult PT Treatment/Exercise - 01/30/20 1745      Therapeutic Activites    Therapeutic Activities Other Therapeutic Activities    Other Therapeutic Activities explained anatomy/ phyiology, posture, POC, IAP system, post-surgery impact to IAP system       Neuro Re-ed    Neuro Re-ed Details  cued for body mechanics to minimize straining to pelvic floor/ abdominal mm       Manual Therapy   Manual therapy comments STM/MWM at T/ L junction to promote more rotation of spine                  PT Long Term Goals - 01/30/20 1636      PT LONG TERM GOAL #1   Title Pt will be able to stand for long hours 3-4 hours without sciatica in order to go shopping with family    Time 8    Period Weeks    Status New    Target Date 04/02/20      PT LONG TERM GOAL #2   Title Pt will demo increased L sideflexion  ( L  48 cm  - > 46  cm digit III to floor) and demo minimize spinal deviations to progress to  deep core strengthening exercises for postural stability and pelvic floor mobility    Time 6    Period Weeks    Status New    Target Date 03/19/20      PT LONG TERM GOAL #3   Title Pt will demo proper sitting posture without cues in order to minimize slumped sitting, posterior tightness of pelvic floor to optimize pelvic floor function and optimize deep core mm for less back pain    Time 2    Period Weeks    Status New    Target Date 02/20/20      PT LONG TERM GOAL #4   Title Pt will demo proper deep core mm and pelvic floor coordination technique with no cues in order to maintain proper lengthening of pelvic floor mm    Time 4    Period Weeks    Status New    Target Date 03/05/20      PT LONG TERM GOAL #5   Title Pt will demo equal pelvic girdle alignment and decreased spinal scar restrictions in order to progress to proper core exercises to minmize overactvity of pelvic floor and completely finish urination without post urine dribble    Time 4    Period Weeks    Status New    Target Date 03/05/20      Additional Long Term Goals   Additional Long Term Goals Yes      PT LONG TERM GOAL #6   Title Pt will increase hip abduction strength from L 3+/5 , R 4/5 to B 5/5 in order to minimize overactivity of pelvic floor mm and increase pelvic girdle stability    Time 6    Period Weeks    Status New    Target Date 03/19/20      PT LONG TERM GOAL #7   Title Pt will show decreased abdominal separation from 2 fingers width to < 1 fingers width in order to improve IAP system for pelvic floor and lumbar function    Time 4    Period Weeks    Status New    Target Date 02/27/20      PT LONG TERM GOAL #8   Title Pt will improve his FOTO lumbar score from pt to   > pts in order to improve QOL    Time 10    Period Weeks    Status New      PT LONG TERM GOAL  #9   TITLE Pt will improve his FOTO score for PFDI Urinary    Time 10    Period Weeks    Status New    Target Date 04/09/20                     Plan - 01/31/20 0848    Clinical Impression Statement  Pt is a 39 yo who presents with pelvic floor following lumbar surgery 11/2018 for spondylolisthesis.  Pt noticed after surgery, pt noticed the following Sx : post-urine dribble and incomplete emptying and Peyronie's disease. Pt has a Hx of hemorrhoids and constipation in the past prior to surgery and pt thinks it is related to his poor water intake.  Pt also presents with LBP with sciatica. Pt completed PT for 6 months  Oct-April for his lumbar surgery which helped. Currently, he still experiences LBP with working in the yard, lifted something too heavy, shopping. standing  for long hours 3-4 hours.  Sciatica on R to calf level occured after shopping  for 3-4 hours last week  which was the first time he had it since prior to surgery.  Pt's musculoskeletal assessment revealed uneven pelvic and shoulder height, spinal deviations,  limited spinal /pelvic mobility, weakness of deep core, and diastsis recti, dyscoordination and strength of pelvic floor mm, weak hip weakness, limited movement of pelvic floor mm, poor body mechanics which places strain on the abdominal/pelvic floor mm. These are deficits that indicate an ineffective intraabdominal pressure system associated with increased risk for pt's Sx.   Pt will benefit from coordination training and education on fitness and functional positions in order to gain a more effective intraabdominal pressure system. 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   Pt was provided education on etiology of Sx with anatomy, physiology explanation with images along with the benefits of customized pelvic PT Tx based on pt's medical conditions and musculoskeletal deficits.  Explained the physiology of deep core mm coordination and roles of pelvic floor function in  urination, defecation, sexual function, and postural control with deep core mm system.   Following Tx today which pt tolerated without complaints, pt demo'd equal alignment of pelvic girdle and increased spinal mobility in L sideflexion. Plan to advance pt to deep core coordination and assess pelvic floor further. Pt benefit from skilled PT.      Examination-Activity Limitations Reach Overhead;Locomotion Level;Toileting;Stand;Lift;Carry    Examination-Participation Restrictions Yard Work    Conservation officer, historic buildings Evolving/Moderate complexity    Clinical Decision Making Moderate    Rehab Potential Good    PT Frequency 1x / week   10   PT Duration --   10   PT Treatment/Interventions ADLs/Self Care Home Management;Biofeedback;Cryotherapy;Moist Heat;Traction;Ultrasound;DME Instruction;Gait training;Stair training;Functional mobility training;Therapeutic activities;Therapeutic exercise;Balance training;Neuromuscular re-education;Patient/family education;Manual techniques;Passive range of motion;Dry needling;Energy conservation;Taping;Scar mobilization    PT Home Exercise Plan see above    Consulted and Agree with Plan of Care Patient           Patient will benefit from skilled therapeutic intervention in order to improve the following deficits and impairments:  Pain, Postural dysfunction, Decreased strength, Difficulty walking, Abnormal gait, Decreased activity tolerance, Decreased endurance, Decreased scar mobility, Decreased mobility, Decreased range of motion, Hypomobility, Impaired flexibility, Increased muscle spasms, Impaired sensation, Improper body mechanics, Increased fascial restricitons, Decreased safety awareness  Visit Diagnosis: Muscle weakness (generalized)  Other idiopathic scoliosis, thoracolumbar region  Other abnormalities of gait and mobility  Diastasis recti  Myalgia     Problem List Patient Active Problem List   Diagnosis Date Noted  .  Spondylolisthesis at L5-S1 level 11/14/2018  . Essential hypertension 03/30/2018  . Elevated blood sugar 08/15/2014  . GERD (gastroesophageal reflux disease) 08/15/2014  . External hemorrhoid 08/15/2014  . Hypercholesteremia 08/15/2014  . Hypotestosteronism 08/15/2014  . Cardiac murmur 08/15/2014  . Awareness of heartbeats 08/15/2014  . Avitaminosis D 08/15/2014    Mariane Masters ,PT, DPT, E-RYT  01/31/2020, 12:11 PM  [facility] MAIN [facility] SERVICES [address] [place], [place], [address] Phone: [phone]   Fax:  [phone]  Name: Eric Lane MRN: [id number] Date of Birth: 11-15-1980

## 2020-02-06 ENCOUNTER — Ambulatory Visit: Payer: Commercial Managed Care - PPO | Attending: Physician Assistant | Admitting: Physical Therapy

## 2020-02-06 ENCOUNTER — Other Ambulatory Visit: Payer: Self-pay

## 2020-02-06 DIAGNOSIS — M5442 Lumbago with sciatica, left side: Secondary | ICD-10-CM | POA: Diagnosis present

## 2020-02-06 DIAGNOSIS — M791 Myalgia, unspecified site: Secondary | ICD-10-CM | POA: Diagnosis present

## 2020-02-06 DIAGNOSIS — M6208 Separation of muscle (nontraumatic), other site: Secondary | ICD-10-CM | POA: Diagnosis present

## 2020-02-06 DIAGNOSIS — M6281 Muscle weakness (generalized): Secondary | ICD-10-CM | POA: Diagnosis present

## 2020-02-06 DIAGNOSIS — G8929 Other chronic pain: Secondary | ICD-10-CM | POA: Insufficient documentation

## 2020-02-06 DIAGNOSIS — M4125 Other idiopathic scoliosis, thoracolumbar region: Secondary | ICD-10-CM | POA: Insufficient documentation

## 2020-02-06 DIAGNOSIS — R2689 Other abnormalities of gait and mobility: Secondary | ICD-10-CM | POA: Insufficient documentation

## 2020-02-06 DIAGNOSIS — M545 Low back pain, unspecified: Secondary | ICD-10-CM | POA: Insufficient documentation

## 2020-02-06 NOTE — Patient Instructions (Addendum)
At work   Paediatric nurse,   Palms shoulder width apart  Minisquat postion Trunk is parallel to floor  A) Pull buttocks back to lengthen spine, knees bent  3 breaths   B) Bring R hand to the L, and stretch the R side trunk  3 breaths   Brings hands to center again Do the same to the L side stretch by placing L hand on top of R   D) Modified thread the needle R hand on L thigh, L  thigh pushing out slightly as the R hands pull in,  elbow bent and pulls to theR,  Look under L armpit   Do the same to other side  _______  Midback stretches:  Bear stretch in wall squat    _____________________________________ _____________________________________  Bed stretches :    Angels wings ( elbows up and down by ribs on exhale), arms flat on bed * make sure to pelvic tilt under to lengthen back and have pillows under knees  10 reps   To move shoulder blades and lower shoulder muscles down   Open books  10 reps    childs poses rocking   Toes tucked, shoulders down and back, on forearms , hands shoulder width apart 10 reps

## 2020-02-06 NOTE — Addendum Note (Signed)
Addended by: Mariane Masters on: 02/06/2020 11:45 AM   Modules accepted: Orders

## 2020-02-06 NOTE — Therapy (Signed)
[facility] MAIN [facility] SERVICES [address] [place], [place], [address] Phone: [phone]   Fax:  [phone]  Physical Therapy Treatment  Patient Details  Name: Eric Lane MRN: [id number] Date of Birth: [birth year] Referring Provider (PT): Vaillancourt    Encounter Date: 02/06/2020   PT End of Session - 02/06/20 1813    Visit Number 2    Number of Visits 10    Date for PT Re-Evaluation 04/09/20    PT Start Time 1700    PT Stop Time 1610    PT Time Calculation (min) 1390 min    Activity Tolerance Patient tolerated treatment well;No increased pain    Behavior During Therapy [facility] for tasks assessed/performed           No past medical history on file.  Past Surgical History:  Procedure Laterality Date  . BACK SURGERY     Fusion of L5/S1   . EYE SURGERY    . LASIK Bilateral 09/20/2014    There were no vitals filed for this visit.   Subjective Assessment - 02/06/20 1723    Subjective Pt did stretches in the morning and evening. Pt is not sure if he is doing them right. Pt noticed penis retracts more when trying to urinate in seated position.    Pertinent History Hx of hemorrhoids and constipation in the past prior to surgery and pt thinks it is related to his poor wter intake. Denied straining with bowel movements. Daily fluid intake: 32 floz of water, 16 fl oz soda.  Per week: 2 cups of coffee,  4-5 beers.  Hobbies sports: golfing and biking but pt has not returned to road biking. Biked for 2-3 hours , 20-30 miles per week    Patient Stated Goals address post-void and peyronies issues, return to biking              [facility] PT Assessment - 02/06/20 1732      Palpation   SI assessment  iliac crest levelled, L shoulder slightly higher, more upper trap, upper lumbar convex curve     Palpation comment hypmobile at thoracic 5-7                           [facility] Adult PT Treatment/Exercise - 02/06/20 1745       Therapeutic Activites    Other Therapeutic Activities explained with anatomy images the progression and phases of Tx        Neuro Re-ed    Neuro Re-ed Details  cued for thoracic extension in low cobra but not added to HEP, cued for proper technique for back stretches at work / home, scapular mobility HEP      Manual Therapy   Manual therapy comments PA mob grade III with MWM to promote thoracic 5-7  mobility, STM/MWM at L medial scapular mm, intercostals B    STM paraspinals                      PT Long Term Goals - 01/30/20 1636      PT LONG TERM GOAL #1   Title Pt will be able to stand for long hours 3-4 hours without sciatica in order to go shopping with family    Time 8    Period Weeks    Status New    Target Date 04/02/20      PT LONG TERM GOAL #2   Title Pt  will demo increased L sideflexion  ( L  48 cm  - > 46 cm digit III to floor) and demo minimize spinal deviations to progress to deep core strengthening exercises for postural stability and pelvic floor mobility    Time 6    Period Weeks    Status New    Target Date 03/19/20      PT LONG TERM GOAL #3   Title Pt will demo proper sitting posture without cues in order to minimize slumped sitting, posterior tightness of pelvic floor to optimize pelvic floor function and optimize deep core mm for less back pain    Time 2    Period Weeks    Status New    Target Date 02/20/20      PT LONG TERM GOAL #4   Title Pt will demo proper deep core mm and pelvic floor coordination technique with no cues in order to maintain proper lengthening of pelvic floor mm    Time 4    Period Weeks    Status New    Target Date 03/05/20      PT LONG TERM GOAL #5   Title Pt will demo equal pelvic girdle alignment and decreased spinal scar restrictions in order to progress to proper core exercises to minmize overactvity of pelvic floor and completely finish urination without post urine dribble    Time 4    Period Weeks    Status New     Target Date 03/05/20      Additional Long Term Goals   Additional Long Term Goals Yes      PT LONG TERM GOAL #6   Title Pt will increase hip abduction strength from L 3+/5 , R 4/5 to B 5/5 in order to minimize overactivity of pelvic floor mm and increase pelvic girdle stability    Time 6    Period Weeks    Status New    Target Date 03/19/20      PT LONG TERM GOAL #7   Title Pt will show decreased abdominal separation from 2 fingers width to < 1 fingers width in order to improve IAP system for pelvic floor and lumbar function    Time 4    Period Weeks    Status New    Target Date 02/27/20      PT LONG TERM GOAL #8   Title Pt will improve his FOTO lumbar score from pt to   > pts in order to improve QOL    Time 10    Period Weeks    Status New      PT LONG TERM GOAL  #9   TITLE Pt will improve his FOTO score for PFDI Urinary    Time 10    Period Weeks    Status New    Target Date 04/09/20                 Plan - 02/06/20 1814    Clinical Impression Statement Pt demo'd good carry over with levelled iliac crest and less spinal deviations. Focused on decreasing L upper trap tightness, thoracic and scapular hypomobility in order to promote diaphragmatic excursion. Plan to address DRA at next session with K tape and manual Tx and advance with deep core strengthening., Anticipate deep core coordination will be more successful as pt demo'd less thoracic/ upper trap tightness today. Pt continues to benefit from skilled PT    Examination-Activity Limitations Reach Overhead;Locomotion Level;Toileting;Stand;Lift;Carry    Examination-Participation Restrictions Yard Work  Stability/Clinical Decision Making Evolving/Moderate complexity    Rehab Potential Good    PT Frequency 1x / week   10   PT Duration --   10   PT Treatment/Interventions ADLs/Self Care Home Management;Biofeedback;Cryotherapy;Moist Heat;Traction;Ultrasound;DME Instruction;Gait training;Stair training;Functional  mobility training;Therapeutic activities;Therapeutic exercise;Balance training;Neuromuscular re-education;Patient/family education;Manual techniques;Passive range of motion;Dry needling;Energy conservation;Taping;Scar mobilization    PT Home Exercise Plan see above    Consulted and Agree with Plan of Care Patient           Patient will benefit from skilled therapeutic intervention in order to improve the following deficits and impairments:  Pain, Postural dysfunction, Decreased strength, Difficulty walking, Abnormal gait, Decreased activity tolerance, Decreased endurance, Decreased scar mobility, Decreased mobility, Decreased range of motion, Hypomobility, Impaired flexibility, Increased muscle spasms, Impaired sensation, Improper body mechanics, Increased fascial restricitons, Decreased safety awareness  Visit Diagnosis: Other idiopathic scoliosis, thoracolumbar region  Diastasis recti  Other abnormalities of gait and mobility  Myalgia  Muscle weakness (generalized)  Chronic midline low back pain, unspecified whether sciatica present  Chronic left-sided low back pain with left-sided sciatica     Problem List Patient Active Problem List   Diagnosis Date Noted  . Spondylolisthesis at L5-S1 level 11/14/2018  . Essential hypertension 03/30/2018  . Elevated blood sugar 08/15/2014  . GERD (gastroesophageal reflux disease) 08/15/2014  . External hemorrhoid 08/15/2014  . Hypercholesteremia 08/15/2014  . Hypotestosteronism 08/15/2014  . Cardiac murmur 08/15/2014  . Awareness of heartbeats 08/15/2014  . Avitaminosis D 08/15/2014    Mariane Masters ,PT, DPT, E-RYT  02/06/2020, 6:15 PM  [facility] MAIN [facility] SERVICES [address] [place], [place], [address] Phone: [phone]   Fax:  [phone]  Name: Eric Lane MRN: [id number] Date of Birth: 01/30/1981

## 2020-02-15 ENCOUNTER — Ambulatory Visit: Payer: Commercial Managed Care - PPO | Admitting: Physical Therapy

## 2020-02-15 ENCOUNTER — Other Ambulatory Visit: Payer: Self-pay

## 2020-02-15 DIAGNOSIS — R2689 Other abnormalities of gait and mobility: Secondary | ICD-10-CM

## 2020-02-15 DIAGNOSIS — G8929 Other chronic pain: Secondary | ICD-10-CM

## 2020-02-15 DIAGNOSIS — M6281 Muscle weakness (generalized): Secondary | ICD-10-CM

## 2020-02-15 DIAGNOSIS — M4125 Other idiopathic scoliosis, thoracolumbar region: Secondary | ICD-10-CM | POA: Diagnosis not present

## 2020-02-15 DIAGNOSIS — M791 Myalgia, unspecified site: Secondary | ICD-10-CM

## 2020-02-15 DIAGNOSIS — M6208 Separation of muscle (nontraumatic), other site: Secondary | ICD-10-CM

## 2020-02-15 NOTE — Patient Instructions (Addendum)
With deep core level 1 and 2  __  Focus this week, "smell soup" with soft breath to not perform chest breathing and to actually expand diaphragm to lengthen pelvic floor  While performing deep core, plant feet hip width apart , knees aligned     Practice proper pelvic floor coordination with deep core level 1 and 2    Inhale: expand pelvic floor muscles Exhale" "j" scoop, allow pelvic floor to close, lift first before belly sinks   ( not "draw abdominal muscle to spine" or strain with abdominal muscles")   ___  Anatomy pictures provided to understand pudendal nerve, scarpa/ colles fascia, nerves to low abdomen from thoracic spine   __  Stretch for pelvic floor   V- slides  "v heels slide away and then back toward buttocks and then rock knee to slight ,  slide heel along at 11 o clock away from buttocks   10 reps

## 2020-02-15 NOTE — Therapy (Signed)
[facility] MAIN [facility] SERVICES [address] [place], [place], [address] Phone: [phone]   Fax:  [phone]  Physical Therapy Treatment  Patient Details  Name: Eric Lane MRN: [id number] Date of Birth: 12/27/1980 Referring Provider (PT): Vaillancourt    Encounter Date: 02/15/2020   PT End of Session - 02/15/20 1705    Visit Number 3    Number of Visits 10    Date for PT Re-Evaluation 04/09/20    PT Start Time 1702    PT Stop Time 1800    PT Time Calculation (min) 58 min    Activity Tolerance Patient tolerated treatment well;No increased pain    Behavior During Therapy [facility] for tasks assessed/performed           No past medical history on file.  Past Surgical History:  Procedure Laterality Date  . BACK SURGERY     Fusion of L5/S1   . EYE SURGERY    . LASIK Bilateral 09/20/2014    There were no vitals filed for this visit.   Subjective Assessment - 02/15/20 1705    Subjective Pt has been doing his exercsies. Penis deviation is to the left. Urination is not deviation to one side    Pertinent History Hx of hemorrhoids and constipation in the past prior to surgery and pt thinks it is related to his poor wter intake. Denied straining with bowel movements. Daily fluid intake: 32 floz of water, 16 fl oz soda.  Per week: 2 cups of coffee,  4-5 beers.  Hobbies sports: golfing and biking but pt has not returned to road biking. Biked for 2-3 hours , 20-30 miles per week    Patient Stated Goals address post-void and peyronies issues, return to biking              [facility] PT Assessment - 02/15/20 1707      Coordination   Gross Motor Movements are Fluid and Coordinated --   chest breathing,     Strength   Overall Strength Comments L hip abd 4/5      Palpation   SI assessment  iliac crest levelled, shoulder height levelled                      Pelvic Floor Special Questions - 02/15/20 1736    Diastasis Recti no  fingers width    External Perineal Exam through clothing: increased tightness without tenderness at ischiocavernosus/ ischoanal foss, transverse perineal,  ischial tuberosity L > R    External Palpation post Tx: increased lengthening and sequential contraction of pelvic floor , required cues for less overuse of gluts/ adductors and ab. cued for pelvic tilts during mobilizations of tight mm,                          PT Long Term Goals - 01/30/20 1636      PT LONG TERM GOAL #1   Title Pt will be able to stand for long hours 3-4 hours without sciatica in order to go shopping with family    Time 8    Period Weeks    Status New    Target Date 04/02/20      PT LONG TERM GOAL #2   Title Pt will demo increased L sideflexion  ( L  48 cm  - > 46 cm digit III to floor) and demo minimize spinal deviations to progress to deep core strengthening  exercises for postural stability and pelvic floor mobility    Time 6    Period Weeks    Status New    Target Date 03/19/20      PT LONG TERM GOAL #3   Title Pt will demo proper sitting posture without cues in order to minimize slumped sitting, posterior tightness of pelvic floor to optimize pelvic floor function and optimize deep core mm for less back pain    Time 2    Period Weeks    Status New    Target Date 02/20/20      PT LONG TERM GOAL #4   Title Pt will demo proper deep core mm and pelvic floor coordination technique with no cues in order to maintain proper lengthening of pelvic floor mm    Time 4    Period Weeks    Status New    Target Date 03/05/20      PT LONG TERM GOAL #5   Title Pt will demo equal pelvic girdle alignment and decreased spinal scar restrictions in order to progress to proper core exercises to minmize overactvity of pelvic floor and completely finish urination without post urine dribble    Time 4    Period Weeks    Status New    Target Date 03/05/20      Additional Long Term Goals   Additional Long Term  Goals Yes      PT LONG TERM GOAL #6   Title Pt will increase hip abduction strength from L 3+/5 , R 4/5 to B 5/5 in order to minimize overactivity of pelvic floor mm and increase pelvic girdle stability    Time 6    Period Weeks    Status New    Target Date 03/19/20      PT LONG TERM GOAL #7   Title Pt will show decreased abdominal separation from 2 fingers width to < 1 fingers width in order to improve IAP system for pelvic floor and lumbar function    Time 4    Period Weeks    Status New    Target Date 02/27/20      PT LONG TERM GOAL #8   Title Pt will improve his FOTO lumbar score from pt to   > pts in order to improve QOL    Time 10    Period Weeks    Status New      PT LONG TERM GOAL  #9   TITLE Pt will improve his FOTO score for PFDI Urinary    Time 10    Period Weeks    Status New    Target Date 04/09/20                 Plan - 02/15/20 1706    Clinical Impression Statement Pt demo'd good carry over with more levelled shoulders and pelvic girdle, thus pt was deemed ready for pelvic floor assessment and Tx today. External palpation through clothing today showed increased tightness at anterior and posterior mm, overuse of ab and global mm of gluts and adductors with contraction. Post Tx, pt demo'd increased pelvic floor lengthening and more sequential contraction of mm. Explained about pelvic floor coordination with anatomy pictures of pudendal nerves, fascia connections from thoracic/ abdomen/ pelvic floor. Explained to focus on pelvic floor lengthening in order to achieve more stronger contraction of urethra sphincter which will help with completing urination. Pt voiced understanding and improved coordination post Tx.   Pt continues to benefit from  skilled PT. Plan add thoracolumbar and multidifis strengthening and assess L toe in which may be related to increased pelvic floor tightness on L > R noted today during assessment.     Examination-Activity Limitations Reach  Overhead;Locomotion Level;Toileting;Stand;Lift;Carry    Examination-Participation Restrictions Yard Work    Conservation officer, historic buildings Evolving/Moderate complexity    Rehab Potential Good    PT Frequency 1x / week   10   PT Duration --   10   PT Treatment/Interventions ADLs/Self Care Home Management;Biofeedback;Cryotherapy;Moist Heat;Traction;Ultrasound;DME Instruction;Gait training;Stair training;Functional mobility training;Therapeutic activities;Therapeutic exercise;Balance training;Neuromuscular re-education;Patient/family education;Manual techniques;Passive range of motion;Dry needling;Energy conservation;Taping;Scar mobilization    PT Home Exercise Plan see above    Consulted and Agree with Plan of Care Patient           Patient will benefit from skilled therapeutic intervention in order to improve the following deficits and impairments:  Pain,Postural dysfunction,Decreased strength,Difficulty walking,Abnormal gait,Decreased activity tolerance,Decreased endurance,Decreased scar mobility,Decreased mobility,Decreased range of motion,Hypomobility,Impaired flexibility,Increased muscle spasms,Impaired sensation,Improper body mechanics,Increased fascial restricitons,Decreased safety awareness  Visit Diagnosis: Other idiopathic scoliosis, thoracolumbar region  Diastasis recti  Other abnormalities of gait and mobility  Myalgia  Muscle weakness (generalized)  Chronic midline low back pain, unspecified whether sciatica present  Chronic left-sided low back pain with left-sided sciatica     Problem List Patient Active Problem List   Diagnosis Date Noted  . Spondylolisthesis at L5-S1 level 11/14/2018  . Essential hypertension 03/30/2018  . Elevated blood sugar 08/15/2014  . GERD (gastroesophageal reflux disease) 08/15/2014  . External hemorrhoid 08/15/2014  . Hypercholesteremia 08/15/2014  . Hypotestosteronism 08/15/2014  . Cardiac murmur 08/15/2014  . Awareness of  heartbeats 08/15/2014  . Avitaminosis D 08/15/2014    Mariane Masters ,PT, DPT, E-RYT  02/15/2020, 6:24 PM   [facility] MAIN [facility] SERVICES [address] [place], [place], [address] Phone: [phone]   Fax:  [phone]  Name: TREVIOUS RAMPEY MRN: [id number] Date of Birth: 10-09-1980

## 2020-02-20 ENCOUNTER — Encounter: Payer: Self-pay | Admitting: Physical Therapy

## 2020-02-22 ENCOUNTER — Ambulatory Visit: Payer: Commercial Managed Care - PPO | Admitting: Physical Therapy

## 2020-02-29 ENCOUNTER — Ambulatory Visit: Payer: Commercial Managed Care - PPO | Admitting: Physical Therapy

## 2020-02-29 ENCOUNTER — Other Ambulatory Visit: Payer: Self-pay

## 2020-02-29 DIAGNOSIS — M6208 Separation of muscle (nontraumatic), other site: Secondary | ICD-10-CM

## 2020-02-29 DIAGNOSIS — R2689 Other abnormalities of gait and mobility: Secondary | ICD-10-CM

## 2020-02-29 DIAGNOSIS — M791 Myalgia, unspecified site: Secondary | ICD-10-CM

## 2020-02-29 DIAGNOSIS — M4125 Other idiopathic scoliosis, thoracolumbar region: Secondary | ICD-10-CM | POA: Diagnosis not present

## 2020-02-29 NOTE — Patient Instructions (Signed)
  Clam Shell 45 Degrees  Lying with hips and knees bent 45, one pillow between knees and ankles. Heel together, toes apart like ballerina,  Lift knee with exhale while pressing heels together. Be sure pelvis does not roll backward. Do not arch back. Do 20 times L only   2 times per day.     Complimentary stretch: [employer] _ foot over _ thigh, opposite knee straight  3 breaths   ___   Practice proper pelvic floor coordination during Deep core level 1-2   Inhale: expand pelvic floor muscles Exhale" "j" scoop, allow pelvic floor to close, lift first before belly sinks   ( not "draw abdominal muscle to spine" or strain with abdominal muscles")   ___  Standing to urinate, COM over forward at line of ballmounds,  Stand with equal pressure in ballmounds and heels,   not heels and locked knees  Breath wide at ribs and no up to chest   ___  During day: catch yourself if you are locking knees  Reposition by noticing   COM over forward at line of ballmounds,  equal pressure in ballmounds and heels,

## 2020-02-29 NOTE — Therapy (Signed)
[facility] MAIN [facility] SERVICES [address] [place], [place], [address] Phone: [phone]   Fax:  [phone]  Physical Therapy Treatment  Patient Details  Name: Eric Lane MRN: [id number] Date of Birth: 1980/03/05 Referring Provider (PT): Vaillancourt    Encounter Date: 02/29/2020   PT End of Session - 02/29/20 1603    Visit Number 4    Number of Visits 10    Date for PT Re-Evaluation 04/09/20    PT Start Time 1500    PT Stop Time 1600    PT Time Calculation (min) 60 min    Activity Tolerance Patient tolerated treatment well;No increased pain    Behavior During Therapy [facility] for tasks assessed/performed           No past medical history on file.  Past Surgical History:  Procedure Laterality Date   BACK SURGERY     Fusion of L5/S1    EYE SURGERY     LASIK Bilateral 09/20/2014    There were no vitals filed for this visit.   Subjective Assessment - 02/29/20 1504    Subjective Pt has been practicing relaxing the pelvic floor when urinating and it has helped a little with completing urination. Pt has goal to return to the gym and biking. Pt has rolled his L ankle multiple times.    Pertinent History Hx of hemorrhoids and constipation in the past prior to surgery and pt thinks it is related to his poor wter intake. Denied straining with bowel movements. Daily fluid intake: 32 floz of water, 16 fl oz soda.  Per week: 2 cups of coffee,  4-5 beers.  Hobbies sports: golfing and biking but pt has not returned to road biking. Biked for 2-3 hours , 20-30 miles per week    Patient Stated Goals address post-void and peyronies issues, return to biking              [facility] PT Assessment - 02/29/20 1522      Observation/Other Assessments   Observations L ankle laxity > R      Single Leg Stance   Comments L 9sec, shakiness at trunk, R 30 sec      Strength   Overall Strength Comments L hip 4/5, R 5/5, DF/EV 5/5 B,      Palpation    Palpation comment levelled shoulder/ pelvic girdle      Ambulation/Gait   Gait Comments more medial collapse of plantar arch on L > R,                      Pelvic Floor Special Questions - 02/29/20 1650    Diastasis Recti no fingers width    External Perineal Exam through sheet:  anterior triangle mm tightness L > R  , pelvic tilt with overuse of adductor, gluts             [facility] Adult PT Treatment/Exercise - 02/29/20 1522      Therapeutic Activites    Other Therapeutic Activities Discussed goals for fitness, progression of strengthening HEP, ptrategies to return to biking with less relapse of pelvic issue/ lumbar injuries      Neuro Re-ed    Neuro Re-ed Details  cued for sequential coordination of pelvic floor , standing posture to maintain relaxed pelvic floor mm for urination in standing position   cued for less hyperextended knees     Manual Therapy   Manual therapy comments STM/MWM to mobilize 2nd layer  pelvic floor mm ( anterior triangle)                       PT Long Term Goals - 01/30/20 1636      PT LONG TERM GOAL #1   Title Pt will be able to stand for long hours 3-4 hours without sciatica in order to go shopping with family    Time 8    Period Weeks    Status New    Target Date 04/02/20      PT LONG TERM GOAL #2   Title Pt will demo increased L sideflexion  ( L  48 cm  - > 46 cm digit III to floor) and demo minimize spinal deviations to progress to deep core strengthening exercises for postural stability and pelvic floor mobility    Time 6    Period Weeks    Status New    Target Date 03/19/20      PT LONG TERM GOAL #3   Title Pt will demo proper sitting posture without cues in order to minimize slumped sitting, posterior tightness of pelvic floor to optimize pelvic floor function and optimize deep core mm for less back pain    Time 2    Period Weeks    Status New    Target Date 02/20/20      PT LONG TERM GOAL #4   Title Pt will  demo proper deep core mm and pelvic floor coordination technique with no cues in order to maintain proper lengthening of pelvic floor mm    Time 4    Period Weeks    Status New    Target Date 03/05/20      PT LONG TERM GOAL #5   Title Pt will demo equal pelvic girdle alignment and decreased spinal scar restrictions in order to progress to proper core exercises to minmize overactvity of pelvic floor and completely finish urination without post urine dribble    Time 4    Period Weeks    Status New    Target Date 03/05/20      Additional Long Term Goals   Additional Long Term Goals Yes      PT LONG TERM GOAL #6   Title Pt will increase hip abduction strength from L 3+/5 , R 4/5 to B 5/5 in order to minimize overactivity of pelvic floor mm and increase pelvic girdle stability    Time 6    Period Weeks    Status New    Target Date 03/19/20      PT LONG TERM GOAL #7   Title Pt will show decreased abdominal separation from 2 fingers width to < 1 fingers width in order to improve IAP system for pelvic floor and lumbar function    Time 4    Period Weeks    Status New    Target Date 02/27/20      PT LONG TERM GOAL #8   Title Pt will improve his FOTO lumbar score from pt to   > pts in order to improve QOL    Time 10    Period Weeks    Status New      PT LONG TERM GOAL  #9   TITLE Pt will improve his FOTO score for PFDI Urinary    Time 10    Period Weeks    Status New    Target Date 04/09/20  Plan - 02/29/20 1624    Clinical Impression Statement Pt demo'd equal alignment of shoulder/ pelvis and less spinal deviations. Pt demo'd less posterior pelvic floor mm tightness but showed anterior pelvic floor tightness L > R which improved post Tx.  Pt required cues for anterior tilt of pelvic and more kinetic chain co-activation to minimize mm tightness ( standing to urinate with less hyperextended knees and anterior tilt of pelvis and less chest breathing). Pt showed  L sided kinetic chain deficits ( L hip abduction weakness, L medial collapse of plantar arch, increased L ankle laxity) which need to be addressed to minimize L pelvic floor overactivity. Pt continues to benefit from skilled PT. Discussed fitness goals and return to biking and the intermediary steps to achieve these goals with decreased risk of relapse of pelvic and lumbar Sx.    Examination-Activity Limitations Reach Overhead;Locomotion Level;Toileting;Stand;Lift;Carry    Examination-Participation Restrictions Yard Work    Conservation officer, historic buildings Evolving/Moderate complexity    Rehab Potential Good    PT Frequency 1x / week   10   PT Duration --   10   PT Treatment/Interventions ADLs/Self Care Home Management;Biofeedback;Cryotherapy;Moist Heat;Traction;Ultrasound;DME Instruction;Gait training;Stair training;Functional mobility training;Therapeutic activities;Therapeutic exercise;Balance training;Neuromuscular re-education;Patient/family education;Manual techniques;Passive range of motion;Dry needling;Energy conservation;Taping;Scar mobilization    PT Home Exercise Plan see above    Consulted and Agree with Plan of Care Patient           Patient will benefit from skilled therapeutic intervention in order to improve the following deficits and impairments:  Pain,Postural dysfunction,Decreased strength,Difficulty walking,Abnormal gait,Decreased activity tolerance,Decreased endurance,Decreased scar mobility,Decreased mobility,Decreased range of motion,Hypomobility,Impaired flexibility,Increased muscle spasms,Impaired sensation,Improper body mechanics,Increased fascial restricitons,Decreased safety awareness  Visit Diagnosis: Myalgia  Other idiopathic scoliosis, thoracolumbar region  Diastasis recti  Other abnormalities of gait and mobility     Problem List Patient Active Problem List   Diagnosis Date Noted   Spondylolisthesis at L5-S1 level 11/14/2018   Essential  hypertension 03/30/2018   Elevated blood sugar 08/15/2014   GERD (gastroesophageal reflux disease) 08/15/2014   External hemorrhoid 08/15/2014   Hypercholesteremia 08/15/2014   Hypotestosteronism 08/15/2014   Cardiac murmur 08/15/2014   Awareness of heartbeats 08/15/2014   Avitaminosis D 08/15/2014    Mariane Masters ,PT, DPT, E-RYT  02/29/2020, 5:16 PM  [facility] MAIN [facility] SERVICES [address] [place], [place], [address] Phone: [phone]   Fax:  [phone]  Name: EURIAH MATLACK MRN: [id number] Date of Birth: 11-Jun-1980

## 2020-03-05 ENCOUNTER — Other Ambulatory Visit: Payer: Self-pay

## 2020-03-05 ENCOUNTER — Ambulatory Visit: Payer: Commercial Managed Care - PPO | Attending: Physician Assistant | Admitting: Physical Therapy

## 2020-03-05 DIAGNOSIS — M5442 Lumbago with sciatica, left side: Secondary | ICD-10-CM | POA: Insufficient documentation

## 2020-03-05 DIAGNOSIS — M6281 Muscle weakness (generalized): Secondary | ICD-10-CM | POA: Diagnosis not present

## 2020-03-05 DIAGNOSIS — M791 Myalgia, unspecified site: Secondary | ICD-10-CM | POA: Insufficient documentation

## 2020-03-05 DIAGNOSIS — M4125 Other idiopathic scoliosis, thoracolumbar region: Secondary | ICD-10-CM | POA: Diagnosis present

## 2020-03-05 DIAGNOSIS — M6208 Separation of muscle (nontraumatic), other site: Secondary | ICD-10-CM | POA: Insufficient documentation

## 2020-03-05 DIAGNOSIS — G8929 Other chronic pain: Secondary | ICD-10-CM | POA: Insufficient documentation

## 2020-03-05 DIAGNOSIS — R2689 Other abnormalities of gait and mobility: Secondary | ICD-10-CM | POA: Diagnosis present

## 2020-03-05 DIAGNOSIS — M545 Low back pain, unspecified: Secondary | ICD-10-CM | POA: Insufficient documentation

## 2020-03-05 NOTE — Patient Instructions (Signed)
   Feet slides :   Points of contact at sitting bones  Four points of contact of foot, Heel up, ankle not twist out Lower heel Four points of contact of foot, Slide foot back   Repeated with other foot    __     WALKING WITH RESISTANCE BLUE Band at waist connected to doorknob Stepping forward normal length steps, planting mid and forefoot down, center of mass ( navel) leans forward slightly as if you were walking uphill 3-4 steps till band feels taut ( MAKE SURE THE DOOR IS LOCKED AND WON'T OPEN)   Stepping backwards, lower heel slowly, carry trunk and hips back , leaning forward, front knee along 2-3 rd toe line    3 min  ____   Feet care :  Self -feet massage   Handshake : fingers between toes, moving ballmounds/toes back and forth several times while other hand anchors at arch. Do the same at the hind/mid foot.  Heel to toes upward to a letter Big Letter T strokes to spread ballmounds and toes, several times, pinch between webs of toes  Run finger tips along top of foot between long bones "comb between the bones"    Wiggle toes and spread them out when relaxing

## 2020-03-05 NOTE — Therapy (Signed)
[facility] MAIN [facility] SERVICES [address] [place], [place], [address] Phone: [phone]   Fax:  [phone]  Physical Therapy Treatment  Patient Details  Name: Eric Lane MRN: [id number] Date of Birth: October 17, 1980 Referring Provider (PT): Vaillancourt    Encounter Date: 03/05/2020   PT End of Session - 03/05/20 1717    Visit Number 5    Number of Visits 10    Date for PT Re-Evaluation 04/09/20    PT Start Time 1603    PT Stop Time 1700    PT Time Calculation (min) 57 min    Activity Tolerance Patient tolerated treatment well;No increased pain    Behavior During Therapy [facility] for tasks assessed/performed           No past medical history on file.  Past Surgical History:  Procedure Laterality Date  . BACK SURGERY     Fusion of L5/S1   . EYE SURGERY    . LASIK Bilateral 09/20/2014    There were no vitals filed for this visit.   Subjective Assessment - 03/05/20 1608    Subjective Pt noticed when he is not locking his knees when urinating in a standing position, it is easier to finish urination. Pt notices he is not feeling the flow of urine during the last few seconds of urinating. Pt is not having his issues every time he urinates and that improvement is at 45-50%.    Pertinent History Hx of hemorrhoids and constipation in the past prior to surgery and pt thinks it is related to his poor wter intake. Denied straining with bowel movements. Daily fluid intake: 32 floz of water, 16 fl oz soda.  Per week: 2 cups of coffee,  4-5 beers.  Hobbies sports: golfing and biking but pt has not returned to road biking. Biked for 2-3 hours , 20-30 miles per week    Patient Stated Goals address post-void and peyronies issues, return to biking              [facility] PT Assessment - 03/05/20 1710      Single Leg Stance   Comments SLS 30 sec B      Palpation   Palpation comment tightness at extensor digitorium/ adductror hallucis/ DAB/ PAD/  lumbricals B   hypomobile midfoot / hind foot                     Pelvic Floor Special Questions - 03/05/20 1721    External Perineal Exam through clothing, no tightness at B anterior triangle, upward lift with cue for contraction             [facility] Adult PT Treatment/Exercise - 03/05/20 1711      Neuro Re-ed    Neuro Re-ed Details  cued for transverse arch co-activation      Manual Therapy   Manual therapy comments distraction/ AP /PA mob Grade III to promote DF/EV and address tightness at extensor digitorium/ adductror hallucis/ DAB/ PAD/ lumbricals B    Kinesiotex --   toe abduction/ lifted arch                      PT Long Term Goals - 03/05/20 1722      PT LONG TERM GOAL #1   Title Pt will be able to stand for long hours 3-4 hours without sciatica in order to go shopping with family    Time 8    Period Weeks  Status On-going      PT LONG TERM GOAL #2   Title Pt will demo increased L sideflexion  ( L  48 cm  - > 46 cm digit III to floor) and demo minimize spinal deviations to progress to deep core strengthening exercises for postural stability and pelvic floor mobility    Time 6    Period Weeks    Status On-going      PT LONG TERM GOAL #3   Title Pt will demo proper sitting posture without cues in order to minimize slumped sitting, posterior tightness of pelvic floor to optimize pelvic floor function and optimize deep core mm for less back pain    Time 2    Period Weeks    Status Achieved      PT LONG TERM GOAL #4   Title Pt will demo proper deep core mm and pelvic floor coordination technique with no cues in order to maintain proper lengthening of pelvic floor mm    Time 4    Period Weeks    Status On-going      PT LONG TERM GOAL #5   Title Pt will demo equal pelvic girdle alignment and decreased spinal scar restrictions in order to progress to proper core exercises to minmize overactvity of pelvic floor and completely finish urination  without post urine dribble    Time 4    Period Weeks    Status Achieved      PT LONG TERM GOAL #6   Title Pt will increase hip abduction strength from L 3+/5 , R 4/5 to B 5/5 in order to minimize overactivity of pelvic floor mm and increase pelvic girdle stability    Time 6    Period Weeks    Status On-going      PT LONG TERM GOAL #7   Title Pt will show decreased abdominal separation from 2 fingers width to < 1 fingers width in order to improve IAP system for pelvic floor and lumbar function    Time 4    Period Weeks    Status On-going      PT LONG TERM GOAL #8   Title Pt will improve his FOTO lumbar score from pt to   > pts in order to improve QOL    Time 10    Period Weeks    Status On-going      PT LONG TERM GOAL  #9   TITLE Pt will improve his FOTO score for PFDI Urinary    Time 10    Period Weeks    Status On-going                 Plan - 03/05/20 1719    Clinical Impression Statement Pt is making progress as he reports 45-50% improvement with urinary issues. Pt demo'd decreased pelvic floor mm tightness . Today assessed and addressed lower kinetic chain deficits ( medial collapse of feet, increased hypomobilty of hind/ mind foot).  Pt demo'd improved eccentric control of lowering heel without medial collapse and more co-activation of transverse arches when standing.  At next session,  plan to address his decreased sensation with urination near end urination.  Pt continues to benefit from skilled PT     Examination-Activity Limitations Reach Overhead;Locomotion Level;Toileting;Stand;Lift;Carry    Examination-Participation Restrictions Yard Work    Stability/Clinical Decision Making Evolving/Moderate complexity    Rehab Potential Good    PT Frequency 1x / week   10   PT Duration --  10   PT Treatment/Interventions ADLs/Self Care Home Management;Biofeedback;Cryotherapy;Moist Heat;Traction;Ultrasound;DME Instruction;Gait training;Stair training;Functional mobility  training;Therapeutic activities;Therapeutic exercise;Balance training;Neuromuscular re-education;Patient/family education;Manual techniques;Passive range of motion;Dry needling;Energy conservation;Taping;Scar mobilization    PT Home Exercise Plan see above    Consulted and Agree with Plan of Care Patient           Patient will benefit from skilled therapeutic intervention in order to improve the following deficits and impairments:  Pain,Postural dysfunction,Decreased strength,Difficulty walking,Abnormal gait,Decreased activity tolerance,Decreased endurance,Decreased scar mobility,Decreased mobility,Decreased range of motion,Hypomobility,Impaired flexibility,Increased muscle spasms,Impaired sensation,Improper body mechanics,Increased fascial restricitons,Decreased safety awareness  Visit Diagnosis: Muscle weakness (generalized)  Other idiopathic scoliosis, thoracolumbar region  Diastasis recti  Other abnormalities of gait and mobility  Myalgia     Problem List Patient Active Problem List   Diagnosis Date Noted  . Spondylolisthesis at L5-S1 level 11/14/2018  . Essential hypertension 03/30/2018  . Elevated blood sugar 08/15/2014  . GERD (gastroesophageal reflux disease) 08/15/2014  . External hemorrhoid 08/15/2014  . Hypercholesteremia 08/15/2014  . Hypotestosteronism 08/15/2014  . Cardiac murmur 08/15/2014  . Awareness of heartbeats 08/15/2014  . Avitaminosis D 08/15/2014    Mariane Masters ,PT, DPT, E-RYT  03/05/2020, 5:23 PM  [place] [facility] MAIN [facility] SERVICES [address] [place], [place], [address] Phone: [phone]   Fax:  [phone]  Name: Eric Lane MRN: [id number] Date of Birth: 1981/02/18

## 2020-03-12 ENCOUNTER — Encounter: Payer: Commercial Managed Care - PPO | Admitting: Physical Therapy

## 2020-03-19 ENCOUNTER — Ambulatory Visit: Payer: Commercial Managed Care - PPO | Admitting: Physical Therapy

## 2020-03-19 ENCOUNTER — Other Ambulatory Visit: Payer: Self-pay

## 2020-03-19 DIAGNOSIS — M6281 Muscle weakness (generalized): Secondary | ICD-10-CM | POA: Diagnosis not present

## 2020-03-19 DIAGNOSIS — M545 Low back pain, unspecified: Secondary | ICD-10-CM

## 2020-03-19 DIAGNOSIS — R2689 Other abnormalities of gait and mobility: Secondary | ICD-10-CM

## 2020-03-19 DIAGNOSIS — M4125 Other idiopathic scoliosis, thoracolumbar region: Secondary | ICD-10-CM

## 2020-03-19 DIAGNOSIS — M6208 Separation of muscle (nontraumatic), other site: Secondary | ICD-10-CM

## 2020-03-19 DIAGNOSIS — M791 Myalgia, unspecified site: Secondary | ICD-10-CM

## 2020-03-19 DIAGNOSIS — G8929 Other chronic pain: Secondary | ICD-10-CM

## 2020-03-19 NOTE — Patient Instructions (Addendum)
Stretches : (Cuing provided for proper alignment)  Instructions start with Strap on R    Stretches for your legs: LAYING on Back Use upper arms and elbows for stability when pulling strap Opposite knee bent and foot firm in align with hip   Strap on ballmound:  Hip socket  _strap, L knee bent, R ballmound against strap and spread toes, rolling foot 15 deg out and in across midline.  10 reps each side   Hamstring _knee bends  10 reps  With knee pointing straight ( slightly to outside to minimize snapping sensation)      10 reps with knee pointing out towards armpit ( notice the stretch in the medial hamstring muscle)   Hip abductors ( figure 4)      Strap under R thigh, L ankle over R thigh ( stretching L glut)     5  Breaths    Adductors and pelvic floor ( Happy Baby) : Esmond Harps are wide towards armpits, sole of feet towards ceiling   IT band _scoot hips to R, cross R leg over L and straighten knee with strap on ballmound,    Quad in sidelying _strap around the ankle, pulling ankle towards buttocks  Bottom leg firm and stabilization with knee bent    ___  Biking on trainer for 15 min with stretches for pelvic floor, back and spine

## 2020-03-19 NOTE — Therapy (Addendum)
[facility] MAIN [facility] SERVICES [address] [place], [place], [address] Phone: [phone]   Fax:  [phone]  Physical Therapy Treatment  Patient Details  Name: Eric Lane MRN: [id number] Date of Birth: Jul 15, 1980 Referring Provider (PT): Vaillancourt    Encounter Date: 03/19/2020   PT End of Session - 03/19/20 1612    Visit Number 6    Number of Visits 10    Date for PT Re-Evaluation 04/09/20    PT Start Time 1605    PT Stop Time 1700    PT Time Calculation (min) 55 min    Activity Tolerance Patient tolerated treatment well;No increased pain    Behavior During Therapy [facility] for tasks assessed/performed           No past medical history on file.  Past Surgical History:  Procedure Laterality Date  . BACK SURGERY     Fusion of L5/S1   . EYE SURGERY    . LASIK Bilateral 09/20/2014    There were no vitals filed for this visit.   Subjective Assessment - 03/19/20 1608    Subjective Pt reported he walked 6 mi every day for 5 days at [facility] and sometimes carried his dtr who weighs 25 lbs. Pt started feeling L hip tightening up like a cramp when extending the leg out and more after returning from the trip. Pt also shoveled the snow yesterday and felt arms and back soreness but no radiating pain. Pt did not have any radiating back pain during his trip. Urination has improved with more consistent flow by 25-30%.  Pt is still noticing he has less sensation when he thinks he is finished with urinating but he is still going.  Pt notices  when he is focused more on urination and not in a hurry, he has less post urine dribbling and he is able to completely finish urination.    Pertinent History Hx of hemorrhoids and constipation in the past prior to surgery and pt thinks it is related to his poor wter intake. Denied straining with bowel movements. Daily fluid intake: 32 floz of water, 16 fl oz soda.  Per week: 2 cups of coffee,  4-5 beers.   Hobbies sports: golfing and biking but pt has not returned to road biking. Biked for 2-3 hours , 20-30 miles per week    Patient Stated Goals address post-void and peyronies issues, return to biking              [facility] PT Assessment - 03/19/20 1628      Observation/Other Assessments   Observations no cues for standing posture      Flexibility   Soft Tissue Assessment /Muscle Length --   R hipflexion/FADDIR tightness     Palpation   SI assessment  L iliac crest higher, hypomobile T8 /T12  R    Palpation comment tightness at L glut med                      Pelvic Floor Special Questions - 03/19/20 1628    Diastasis Recti no separation             [facility] Adult PT Treatment/Exercise - 03/19/20 1654      Therapeutic Activites    Other Therapeutic Activities recommended pt to return to biking on trainer for 15 min but to make sure to perform stretches      Neuro Re-ed    Neuro Re-ed Details  cued  for lower trunk rotation for glut med/ IT band hamstring stretch      Moist Heat Therapy   Number Minutes Moist Heat 5 Minutes    Moist Heat Location --   L glut med, R T/L junction     Manual Therapy   Manual therapy comments long axis distraciton LLE, L SIJ rotation mob, STM/MWM for R paraspinal/ L glut med,  PA mob Grade III at T8, T12/L1                       PT Long Term Goals - 03/05/20 1722      PT LONG TERM GOAL #1   Title Pt will be able to stand for long hours 3-4 hours without sciatica in order to go shopping with family    Time 8    Period Weeks    Status On-going      PT LONG TERM GOAL #2   Title Pt will demo increased L sideflexion  ( L  48 cm  - > 46 cm digit III to floor) and demo minimize spinal deviations to progress to deep core strengthening exercises for postural stability and pelvic floor mobility    Time 6    Period Weeks    Status On-going      PT LONG TERM GOAL #3   Title Pt will demo proper sitting posture without cues in  order to minimize slumped sitting, posterior tightness of pelvic floor to optimize pelvic floor function and optimize deep core mm for less back pain    Time 2    Period Weeks    Status Achieved      PT LONG TERM GOAL #4   Title Pt will demo proper deep core mm and pelvic floor coordination technique with no cues in order to maintain proper lengthening of pelvic floor mm    Time 4    Period Weeks    Status On-going      PT LONG TERM GOAL #5   Title Pt will demo equal pelvic girdle alignment and decreased spinal scar restrictions in order to progress to proper core exercises to minmize overactvity of pelvic floor and completely finish urination without post urine dribble    Time 4    Period Weeks    Status Achieved      PT LONG TERM GOAL #6   Title Pt will increase hip abduction strength from L 3+/5 , R 4/5 to B 5/5 in order to minimize overactivity of pelvic floor mm and increase pelvic girdle stability    Time 6    Period Weeks    Status On-going      PT LONG TERM GOAL #7   Title Pt will show decreased abdominal separation from 2 fingers width to < 1 fingers width in order to improve IAP system for pelvic floor and lumbar function    Time 4    Period Weeks    Status On-going      PT LONG TERM GOAL #8   Title Pt will improve his FOTO lumbar score from pt to   > pts in order to improve QOL    Time 10    Period Weeks    Status On-going      PT LONG TERM GOAL  #9   TITLE Pt will improve his FOTO score for PFDI Urinary    Time 10    Period Weeks    Status On-going  Plan - 03/19/20 1612    Clinical Impression Statement Pt skipped one week of PT to go on a trip with family and kids to [employer], walking 6 miles each day and sometimes picking up nd carrying his 25 lb dtr. Pt returned today with report of no radiating pain, only L hip feeling tight. Pt demo'd only  minor pelvic obliquities and spinal hypomobility which demo'd pt 's postural stability is  helping pt have more endurance for functional activities with family on out of town trips. Manual Tx helped to realign pelvic girdle and spine. Initiated BLE and hip and spinal stretches to compliment gym activities and return to biking.    Pt's urinary Sx are improving by 25-30% based on his report with decreased post urine dribbling and improved urine stream.   Provided education about progression towards fitness activities to help with energy and his low testerone levels as pt has been concerned about his low levels and sexual function. Therapist explained exercise with proper technique to minimzie relapse of urinary Sx and LBP can be beneficial. Pt voiced understanding. Provided motivational interviewing to learn what type of fitness routine pt would be interested. Encouraged pt to start back biking for 15 min but implement proper stretching. Plan to continue educating a well rounded stretching routine for BLE and hips and back at next session   Pt continues to benefit from skilled PT.      Examination-Activity Limitations Reach Overhead;Locomotion Level;Toileting;Stand;Lift;Carry    Examination-Participation Restrictions Yard Work    Conservation officer, historic buildings Evolving/Moderate complexity    Rehab Potential Good    PT Frequency 1x / week   10   PT Duration --   10   PT Treatment/Interventions ADLs/Self Care Home Management;Biofeedback;Cryotherapy;Moist Heat;Traction;Ultrasound;DME Instruction;Gait training;Stair training;Functional mobility training;Therapeutic activities;Therapeutic exercise;Balance training;Neuromuscular re-education;Patient/family education;Manual techniques;Passive range of motion;Dry needling;Energy conservation;Taping;Scar mobilization    PT Home Exercise Plan see above    Consulted and Agree with Plan of Care Patient           Patient will benefit from skilled therapeutic intervention in order to improve the following deficits and impairments:  Pain,Postural  dysfunction,Decreased strength,Difficulty walking,Abnormal gait,Decreased activity tolerance,Decreased endurance,Decreased scar mobility,Decreased mobility,Decreased range of motion,Hypomobility,Impaired flexibility,Increased muscle spasms,Impaired sensation,Improper body mechanics,Increased fascial restricitons,Decreased safety awareness  Visit Diagnosis: Other idiopathic scoliosis, thoracolumbar region  Diastasis recti  Other abnormalities of gait and mobility  Myalgia  Muscle weakness (generalized)  Chronic midline low back pain, unspecified whether sciatica present  Chronic left-sided low back pain with left-sided sciatica     Problem List Patient Active Problem List   Diagnosis Date Noted  . Spondylolisthesis at L5-S1 level 11/14/2018  . Essential hypertension 03/30/2018  . Elevated blood sugar 08/15/2014  . GERD (gastroesophageal reflux disease) 08/15/2014  . External hemorrhoid 08/15/2014  . Hypercholesteremia 08/15/2014  . Hypotestosteronism 08/15/2014  . Cardiac murmur 08/15/2014  . Awareness of heartbeats 08/15/2014  . Avitaminosis D 08/15/2014    Mariane Masters ,PT, DPT, E-RYT  03/19/2020, 5:29 PM  [facility] MAIN [facility] SERVICES [address] [place], [place], [address] Phone: [phone]   Fax:  [phone]  Name: Eric Lane MRN: [id number] Date of Birth: [birth year]

## 2020-03-26 ENCOUNTER — Ambulatory Visit: Payer: Commercial Managed Care - PPO | Admitting: Physical Therapy

## 2020-03-26 ENCOUNTER — Other Ambulatory Visit: Payer: Self-pay

## 2020-03-26 DIAGNOSIS — G8929 Other chronic pain: Secondary | ICD-10-CM

## 2020-03-26 DIAGNOSIS — R2689 Other abnormalities of gait and mobility: Secondary | ICD-10-CM

## 2020-03-26 DIAGNOSIS — M545 Low back pain, unspecified: Secondary | ICD-10-CM

## 2020-03-26 DIAGNOSIS — M4125 Other idiopathic scoliosis, thoracolumbar region: Secondary | ICD-10-CM

## 2020-03-26 DIAGNOSIS — M6208 Separation of muscle (nontraumatic), other site: Secondary | ICD-10-CM

## 2020-03-26 DIAGNOSIS — M6281 Muscle weakness (generalized): Secondary | ICD-10-CM | POA: Diagnosis not present

## 2020-03-26 DIAGNOSIS — M791 Myalgia, unspecified site: Secondary | ICD-10-CM

## 2020-03-26 NOTE — Patient Instructions (Addendum)
Exercise check off for this week   Mon Tue Wed Thurs Fri  Sat Sun  Heel raises work up to [employer] each , hand on wall X 3 x day           Deep core level 1 with toes spread, arch lifted + quick pelvic floor squeeze:  J-scoop not from belly muscles 10 reps           Deep core level 2 ( 6 min) toes spread, arch lifted          Clam with green band for Leg, R sidelying  20 reps   Figure 4 stretch                ____________________________________________________ Overall spinal mobility:   Lengthen Back rib by _ shoulder    Lie on   side , pillow between knees  Pull  arm overhead over mattress, grab the edge of mattress,pull it upward, drawing elbow away from ears  Breathing     Open book (handout)  Lying on  _ side , rotating  __ only this week  kitchen counter/ table  stretches   Hands on kitchen counter,    Palms shoulder width apart  Minisquat postion Trunk is parallel to floor   A) Pull buttocks back to lengthen spine, knees bent  3 breaths  B) Bring R hand to the L, and stretch the R side trunk  3 breaths    Brings hands to center again Do the same to the L side stretch by placing L hand on top of R    D) Modified thread the needle R hand on L thigh, L  thigh pushing out slightly as the R hands pull in,  elbow bent and pulls to theR,  Look under L armpit    Do the same to other side ___________________________________  Midback stretches:   Bear stretch in wall squat       Angels wings ( elbows up and down by ribs on exhale), arms flat on bed * make sure to pelvic tilt under to lengthen back and have pillows under knees  10 reps   childs poses rocking    Toes tucked, shoulders down and back, on forearms , hands shoulder width apart 10 reps   _______________________________ STRENGTENING DEEP CORE/ Lengthening and strengthening pelvic floor   deep core level 1 ( 10 breaths ) deep core level 2 ( 6 mins)   __ STRENGTHENING L hip  only  Clam Shell 45 Degrees only on L LEG, lying on the side   Lying with hips and knees bent 45, one pillow between knees and ankles. Heel together, toes apart like ballerina,  Lift knee with exhale while pressing heels together. Be sure pelvis does not roll backward. Do not arch back. Do 20 times L only   2 times per day.   Complimentary stretch: Figure-4   3 breaths   ___ Multidifis from last week For back ( band at doorway)   ___  FEET STRENGTHENING and deep core strengthening in functional     WALKING WITH RESISTANCE BLUE Band at waist connected to doorknob Stepping forward normal length steps, planting mid and forefoot down, center of mass ( navel) leans forward slightly as if you were walking uphill 3-4 steps till band feels taut ( MAKE SURE THE DOOR IS LOCKED AND WON'T OPEN)   Stepping backwards, lower heel slowly, carry trunk and hips back , leaning forward, front knee along  2-3 rd toe line    3 min  Heel raises work up to 15 reps single leg with hand on counter   ___ Stretches for biking  : (Cuing provided for proper alignment)  Instructions start with Strap on R    Stretches for your legs: LAYING on Back Use upper arms and elbows for stability when pulling strap Opposite knee bent and foot firm in align with hip   Strap on ballmound:  Hip socket  _strap, L knee bent, R ballmound against strap and spread toes, rolling foot 15 deg out and in across midline. 10 reps each side   Hamstring _knee bends  10 reps  With knee pointing straight ( slightly to outside to minimize snapping sensation)      10 reps with knee pointing out towards armpit ( notice the stretch in the medial hamstring muscle)   Hip abductors ( figure 4)      Strap under R thigh, L ankle over R thigh ( stretching L glut)     5  Breaths    Adductors and pelvic floor ( Happy Baby) : Esmond Harps are wide towards armpits, sole of feet towards ceiling   IT  band _scoot hips to R, cross R leg over L and straighten knee with strap on ballmound,    Quad in sidelying _strap around the ankle, pulling ankle towards buttocks  Bottom leg firm and stabilization with knee bent   ___

## 2020-03-27 NOTE — Therapy (Signed)
[facility] MAIN [facility] SERVICES [address] [place], [place], [address] Phone: [phone]   Fax:  [phone]  Physical Therapy Treatment  Patient Details  Name: Eric Lane MRN: [id number] Date of Birth: August 25, 1980 Referring Provider (PT): Vaillancourt    Encounter Date: 03/26/2020   PT End of Session - 03/26/20 1628    Visit Number 7    Number of Visits 10    Date for PT Re-Evaluation 04/09/20    PT Start Time 1610    PT Stop Time 1700    PT Time Calculation (min) 50 min    Activity Tolerance Patient tolerated treatment well;No increased pain    Behavior During Therapy [facility] for tasks assessed/performed           No past medical history on file.  Past Surgical History:  Procedure Laterality Date  . BACK SURGERY     Fusion of L5/S1   . EYE SURGERY    . LASIK Bilateral 09/20/2014    There were no vitals filed for this visit.   Subjective Assessment - 03/26/20 1618    Subjective Pt reports he notices his muscles are weaker than prior to his  low back surgery. Pt was not able to do the multifidis exercise very much this past week. Pt reports after last session, his L hip has been better.    Pertinent History Hx of hemorrhoids and constipation in the past prior to surgery and pt thinks it is related to his poor wter intake. Denied straining with bowel movements. Daily fluid intake: 32 floz of water, 16 fl oz soda.  Per week: 2 cups of coffee,  4-5 beers.  Hobbies sports: golfing and biking but pt has not returned to road biking. Biked for 2-3 hours , 20-30 miles per week    Patient Stated Goals address post-void and peyronies issues, return to biking              [facility] PT Assessment - 03/27/20 1342      Single Leg Stance   Comments SLS 30 sec B      Strength   Overall Strength Comments PF with single UE support L 11 reps, R 13 reps   L hip abd 3+/5, R 4/5     Palpation   Palpation comment tightness at extensor digitorium/  adductror hallucis/ DAB/ PAD/ lumbricals B   hypomobile midfoot / hind foot                     Pelvic Floor Special Questions - 03/27/20 1342    Diastasis Recti no separation    External Perineal Exam through clothing, no tightness at B anterior triangle, improved pelvic floor mobility   initially abdominal strain w/ contraction            [facility] Adult PT Treatment/Exercise - 03/27/20 1341      Therapeutic Activites    Other Therapeutic Activities answered pt's questions re: male anatomy, consolidated HEP, reassessments      Neuro Re-ed    Neuro Re-ed Details  cued for new HEP   cued for pelvic floor contraction                      PT Long Term Goals - 03/05/20 1722      PT LONG TERM GOAL #1   Title Pt will be able to stand for long hours 3-4 hours without sciatica in order to go shopping with  family    Time 8    Period Weeks    Status On-going      PT LONG TERM GOAL #2   Title Pt will demo increased L sideflexion  ( L  48 cm  - > 46 cm digit III to floor) and demo minimize spinal deviations to progress to deep core strengthening exercises for postural stability and pelvic floor mobility    Time 6    Period Weeks    Status On-going      PT LONG TERM GOAL #3   Title Pt will demo proper sitting posture without cues in order to minimize slumped sitting, posterior tightness of pelvic floor to optimize pelvic floor function and optimize deep core mm for less back pain    Time 2    Period Weeks    Status Achieved      PT LONG TERM GOAL #4   Title Pt will demo proper deep core mm and pelvic floor coordination technique with no cues in order to maintain proper lengthening of pelvic floor mm    Time 4    Period Weeks    Status On-going      PT LONG TERM GOAL #5   Title Pt will demo equal pelvic girdle alignment and decreased spinal scar restrictions in order to progress to proper core exercises to minmize overactvity of pelvic floor and completely  finish urination without post urine dribble    Time 4    Period Weeks    Status Achieved      PT LONG TERM GOAL #6   Title Pt will increase hip abduction strength from L 3+/5 , R 4/5 to B 5/5 in order to minimize overactivity of pelvic floor mm and increase pelvic girdle stability    Time 6    Period Weeks    Status On-going      PT LONG TERM GOAL #7   Title Pt will show decreased abdominal separation from 2 fingers width to < 1 fingers width in order to improve IAP system for pelvic floor and lumbar function    Time 4    Period Weeks    Status On-going      PT LONG TERM GOAL #8   Title Pt will improve his FOTO lumbar score from pt to   > pts in order to improve QOL    Time 10    Period Weeks    Status On-going      PT LONG TERM GOAL  #9   TITLE Pt will improve his FOTO score for PFDI Urinary    Time 10    Period Weeks    Status On-going                 Plan - 03/26/20 1629    Clinical Impression Statement Pt demo'd improved pelvic floor mobility with decreased mm tightness. Planning to advance lower kinetic chain strengthening with heel raises which showed weakness 2/2 flat arches and c/o weakness in feet since lumbar surgery. Pt was explained anatomy and nn innervation from lumbar plexus to feet. Progress to sensory/ bladder retraining at next session.  Upgraded hip abduction strengthening on LLE with red band which is weaker than R. Consolidated HEP.  Provided cues for deep core coordination with increased propioception and awareness of  lengthened pelvic floor and explained length / tension principle to pelvic floor strength. Pt voiced understanding. Progressed pt to quick pelvic floor contractions after cuing for less abdominal straining.  Pt continues to benefit from skilled PT.    Examination-Activity Limitations Reach Overhead;Locomotion Level;Toileting;Stand;Lift;Carry    Examination-Participation Restrictions Yard Work    Conservation officer, historic buildings  Evolving/Moderate complexity    Rehab Potential Good    PT Frequency 1x / week   10   PT Duration --   10   PT Treatment/Interventions ADLs/Self Care Home Management;Biofeedback;Cryotherapy;Moist Heat;Traction;Ultrasound;DME Instruction;Gait training;Stair training;Functional mobility training;Therapeutic activities;Therapeutic exercise;Balance training;Neuromuscular re-education;Patient/family education;Manual techniques;Passive range of motion;Dry needling;Energy conservation;Taping;Scar mobilization    PT Home Exercise Plan see above    Consulted and Agree with Plan of Care Patient         r  Patient will benefit from skilled therapeutic intervention in order to improve the following deficits and impairments:  Pain,Postural dysfunction,Decreased strength,Difficulty walking,Abnormal gait,Decreased activity tolerance,Decreased endurance,Decreased scar mobility,Decreased mobility,Decreased range of motion,Hypomobility,Impaired flexibility,Increased muscle spasms,Impaired sensation,Improper body mechanics,Increased fascial restricitons,Decreased safety awareness  Visit Diagnosis: Other idiopathic scoliosis, thoracolumbar region  Diastasis recti  Other abnormalities of gait and mobility  Myalgia  Muscle weakness (generalized)  Chronic midline low back pain, unspecified whether sciatica present  Chronic left-sided low back pain with left-sided sciatica     Problem List Patient Active Problem List   Diagnosis Date Noted  . Spondylolisthesis at L5-S1 level 11/14/2018  . Essential hypertension 03/30/2018  . Elevated blood sugar 08/15/2014  . GERD (gastroesophageal reflux disease) 08/15/2014  . External hemorrhoid 08/15/2014  . Hypercholesteremia 08/15/2014  . Hypotestosteronism 08/15/2014  . Cardiac murmur 08/15/2014  . Awareness of heartbeats 08/15/2014  . Avitaminosis D 08/15/2014    Mariane Masters ,PT, DPT, E-RYT  03/27/2020, 1:50 PM  [facility] MAIN [facility] SERVICES [address] [place], [place], [address] Phone: [phone]   Fax:  [phone]  Name: Eric Lane MRN: [id number] Date of Birth: 11-28-1980

## 2020-04-02 ENCOUNTER — Ambulatory Visit: Payer: Commercial Managed Care - PPO | Attending: Physician Assistant | Admitting: Physical Therapy

## 2020-04-02 ENCOUNTER — Other Ambulatory Visit: Payer: Self-pay

## 2020-04-02 DIAGNOSIS — M791 Myalgia, unspecified site: Secondary | ICD-10-CM | POA: Insufficient documentation

## 2020-04-02 DIAGNOSIS — M6208 Separation of muscle (nontraumatic), other site: Secondary | ICD-10-CM | POA: Diagnosis present

## 2020-04-02 DIAGNOSIS — M6281 Muscle weakness (generalized): Secondary | ICD-10-CM | POA: Diagnosis present

## 2020-04-02 DIAGNOSIS — G8929 Other chronic pain: Secondary | ICD-10-CM | POA: Insufficient documentation

## 2020-04-02 DIAGNOSIS — R2689 Other abnormalities of gait and mobility: Secondary | ICD-10-CM | POA: Diagnosis present

## 2020-04-02 DIAGNOSIS — M5442 Lumbago with sciatica, left side: Secondary | ICD-10-CM | POA: Diagnosis present

## 2020-04-02 DIAGNOSIS — M4125 Other idiopathic scoliosis, thoracolumbar region: Secondary | ICD-10-CM | POA: Diagnosis not present

## 2020-04-02 DIAGNOSIS — M533 Sacrococcygeal disorders, not elsewhere classified: Secondary | ICD-10-CM | POA: Insufficient documentation

## 2020-04-02 DIAGNOSIS — M545 Low back pain, unspecified: Secondary | ICD-10-CM | POA: Diagnosis present

## 2020-04-02 NOTE — Therapy (Signed)
[facility] MAIN [facility] SERVICES [address] [place], [place], [address] Phone: [phone]   Fax:  [phone]  Physical Therapy Treatment  Patient Details  Name: Eric Lane MRN: [id number] Date of Birth: 05/14/1980 Referring Provider (PT): Vaillancourt    Encounter Date: 04/02/2020   PT End of Session - 04/02/20 1651    Visit Number 8    Number of Visits 10    Date for PT Re-Evaluation 04/09/20    PT Start Time 1604    PT Stop Time 1700    PT Time Calculation (min) 56 min    Activity Tolerance Patient tolerated treatment well;No increased pain    Behavior During Therapy [facility] for tasks assessed/performed           No past medical history on file.  Past Surgical History:  Procedure Laterality Date  . BACK SURGERY     Fusion of L5/S1   . EYE SURGERY    . LASIK Bilateral 09/20/2014    There were no vitals filed for this visit.   Subjective Assessment - 04/02/20 1608    Subjective Pt reports his feet real stiff and sore by the end of the day. Pt notices the dribbling after urination is still happening but is better whne he dont rush.    Pertinent History Hx of hemorrhoids and constipation in the past prior to surgery and pt thinks it is related to his poor wter intake. Denied straining with bowel movements. Daily fluid intake: 32 floz of water, 16 fl oz soda.  Per week: 2 cups of coffee,  4-5 beers.  Hobbies sports: golfing and biking but pt has not returned to road biking. Biked for 2-3 hours , 20-30 miles per week    Patient Stated Goals address post-void and peyronies issues, return to biking              [facility] PT Assessment - 04/02/20 1651      Palpation   Palpation comment distraction of talocrural, PA/AP mob at rays, tightness at adductor hallucis oblique mm, tib ant R, hypomobile midfoot and talocrural      Ambulation/Gait   Gait Comments short strides, limited hip flexion, posterior COM                       Pelvic Floor Special Questions - 04/02/20 1650    External Perineal Exam no tightness, 8 quick contraction in hookyling, 10 in seated             [facility] Adult PT Treatment/Exercise - 04/02/20 1646      Therapeutic Activites    Other Therapeutic Activities gait training , explained about progress of POC      Neuro Re-ed    Neuro Re-ed Details  cued for plantar strengthening exercise , pelvic floor quick contractions, gait training      Manual Therapy   Manual therapy comments distraction of talocrural, PA/AP mob at rays, tightness at adductor hallucis oblique mm, tib ant R                       PT Long Term Goals - 03/05/20 1722      PT LONG TERM GOAL #1   Title Pt will be able to stand for long hours 3-4 hours without sciatica in order to go shopping with family    Time 8    Period Weeks    Status On-going  PT LONG TERM GOAL #2   Title Pt will demo increased L sideflexion  ( L  48 cm  - > 46 cm digit III to floor) and demo minimize spinal deviations to progress to deep core strengthening exercises for postural stability and pelvic floor mobility    Time 6    Period Weeks    Status On-going      PT LONG TERM GOAL #3   Title Pt will demo proper sitting posture without cues in order to minimize slumped sitting, posterior tightness of pelvic floor to optimize pelvic floor function and optimize deep core mm for less back pain    Time 2    Period Weeks    Status Achieved      PT LONG TERM GOAL #4   Title Pt will demo proper deep core mm and pelvic floor coordination technique with no cues in order to maintain proper lengthening of pelvic floor mm    Time 4    Period Weeks    Status On-going      PT LONG TERM GOAL #5   Title Pt will demo equal pelvic girdle alignment and decreased spinal scar restrictions in order to progress to proper core exercises to minmize overactvity of pelvic floor and completely finish urination without post  urine dribble    Time 4    Period Weeks    Status Achieved      PT LONG TERM GOAL #6   Title Pt will increase hip abduction strength from L 3+/5 , R 4/5 to B 5/5 in order to minimize overactivity of pelvic floor mm and increase pelvic girdle stability    Time 6    Period Weeks    Status On-going      PT LONG TERM GOAL #7   Title Pt will show decreased abdominal separation from 2 fingers width to < 1 fingers width in order to improve IAP system for pelvic floor and lumbar function    Time 4    Period Weeks    Status On-going      PT LONG TERM GOAL #8   Title Pt will improve his FOTO lumbar score from pt to   > pts in order to improve QOL    Time 10    Period Weeks    Status On-going      PT LONG TERM GOAL  #9   TITLE Pt will improve his FOTO score for PFDI Urinary    Time 10    Period Weeks    Status On-going                 Plan - 04/02/20 1701    Clinical Impression Pt demo'd no pelvic floor mm tightness across 2 weeks and thus was ready to progress to pelvic floor strengthening. Pt achieved quick contractions with cues in hookyling and seated position. Pt requried manual Tx on R foot to increased mobility and required cues for gait mechanics and new CKC plantar arch strengthening to promote more feet mobility and less collapsed arched. Plan to address Peyronie's condition next session. Encouraged slowing down and paying attention to fully complete urination with proper coordination and contraction of pelvic floor.  Pt continue to benefit from skilled PT    Examination-Activity Limitations Reach Overhead;Locomotion Level;Toileting;Stand;Lift;Carry    Examination-Participation Restrictions Yard Work    Stability/Clinical Decision Making Evolving/Moderate complexity    Rehab Potential Good    PT Frequency 1x / week   10   PT  Duration --   10   PT Treatment/Interventions ADLs/Self Care Home Management;Biofeedback;Cryotherapy;Moist Heat;Traction;Ultrasound;DME  Instruction;Gait training;Stair training;Functional mobility training;Therapeutic activities;Therapeutic exercise;Balance training;Neuromuscular re-education;Patient/family education;Manual techniques;Passive range of motion;Dry needling;Energy conservation;Taping;Scar mobilization    PT Home Exercise Plan see above    Consulted and Agree with Plan of Care Patient           Patient will benefit from skilled therapeutic intervention in order to improve the following deficits and impairments:  Pain,Postural dysfunction,Decreased strength,Difficulty walking,Abnormal gait,Decreased activity tolerance,Decreased endurance,Decreased scar mobility,Decreased mobility,Decreased range of motion,Hypomobility,Impaired flexibility,Increased muscle spasms,Impaired sensation,Improper body mechanics,Increased fascial restricitons,Decreased safety awareness  Visit Diagnosis: No diagnosis found.     Problem List Patient Active Problem List   Diagnosis Date Noted  . Spondylolisthesis at L5-S1 level 11/14/2018  . Essential hypertension 03/30/2018  . Elevated blood sugar 08/15/2014  . GERD (gastroesophageal reflux disease) 08/15/2014  . External hemorrhoid 08/15/2014  . Hypercholesteremia 08/15/2014  . Hypotestosteronism 08/15/2014  . Cardiac murmur 08/15/2014  . Awareness of heartbeats 08/15/2014  . Avitaminosis D 08/15/2014    Mariane Masters 04/02/2020, 5:02 PM  [facility] MAIN [facility] SERVICES [address] [place], [place], [address] Phone: [phone]   Fax:  [phone]  Name: Eric Lane MRN: [id number] Date of Birth: 22-Oct-1980

## 2020-04-02 NOTE — Patient Instructions (Signed)
Deep core level 1 with added 8 quick contractions on back  ___  Seated pelvic floor quick contractions with inhalation to relax again 10 reps  X 3 different times of the time  ___   Feet:  Take steel shoes off when you can in office:   Calf stretch with toes extended against wall   Heel raises   PLEATS: BALLERINA  Heel raises in ballerina position, pressing rolled towel between heels  Hands at a corner of a wall   Knees bent pointed out like a "v" , navel ( center of mass) more forward  Heels together as you lift, pointed out like a "v"  KNEES ARE ALIGNED BEHIND THE TOES TO MINIMIZE STRAIN ON THE KNEES Lower heels down slow with your  navel ( center of mass) more forward to avoid dropping down fast and rocking more weight back onto heels   10 reps      Feet slides :   Points of contact at sitting bones  Four points of contact of foot, Heel up, ankle not twist out Lower heel Four points of contact of foot, Slide foot back   Repeated with other foot

## 2020-04-11 ENCOUNTER — Ambulatory Visit: Payer: Commercial Managed Care - PPO | Admitting: Physical Therapy

## 2020-04-11 ENCOUNTER — Other Ambulatory Visit: Payer: Self-pay

## 2020-04-11 DIAGNOSIS — M545 Low back pain, unspecified: Secondary | ICD-10-CM

## 2020-04-11 DIAGNOSIS — M6281 Muscle weakness (generalized): Secondary | ICD-10-CM

## 2020-04-11 DIAGNOSIS — M4125 Other idiopathic scoliosis, thoracolumbar region: Secondary | ICD-10-CM

## 2020-04-11 DIAGNOSIS — R2689 Other abnormalities of gait and mobility: Secondary | ICD-10-CM

## 2020-04-11 DIAGNOSIS — M6208 Separation of muscle (nontraumatic), other site: Secondary | ICD-10-CM

## 2020-04-11 DIAGNOSIS — G8929 Other chronic pain: Secondary | ICD-10-CM

## 2020-04-11 DIAGNOSIS — M791 Myalgia, unspecified site: Secondary | ICD-10-CM

## 2020-04-12 NOTE — Therapy (Signed)
[facility] MAIN [facility] SERVICES [address] [place], [place], [address] Phone: [phone]   Fax:  [phone]  Physical Therapy Treatment  Patient Details  Name: Eric Lane MRN: [id number] Date of Birth: 06-07-1980 Referring Provider (PT): Vaillancourt    Encounter Date: 04/11/2020   PT End of Session - 04/11/20 1616    Visit Number 9    Number of Visits 10    Date for PT Re-Evaluation 04/09/20    PT Start Time 1611    PT Stop Time 1705    PT Time Calculation (min) 54 min    Activity Tolerance Patient tolerated treatment well;No increased pain    Behavior During Therapy [facility] for tasks assessed/performed           No past medical history on file.  Past Surgical History:  Procedure Laterality Date  . BACK SURGERY     Fusion of L5/S1   . EYE SURGERY    . LASIK Bilateral 09/20/2014    There were no vitals filed for this visit.   Subjective Assessment - 04/11/20 1614    Subjective Pt reports his LBP has not been an issue. Pt noticed his feet has not been cramping as much and he pays attention to walking by bending his feet. Pt did not get a chance to bike.  Pt reports his Peyronie's leans to the L side for the 10 + years and would like to have this addressed. Denied deviated urine stream . Pt plays golf weekly    Pertinent History Hx of hemorrhoids and constipation in the past prior to surgery and pt thinks it is related to his poor wter intake. Denied straining with bowel movements. Daily fluid intake: 32 floz of water, 16 fl oz soda.  Per week: 2 cups of coffee,  4-5 beers.  Hobbies sports: golfing and biking but pt has not returned to road biking. Biked for 2-3 hours , 20-30 miles per week    Patient Stated Goals address post-void and peyronies issues, return to biking              [facility] PT Assessment - 04/11/20 1627      Strength   Overall Strength Comments hip strength L abd 4-/5, R 4+/5,  PF without UE L 7 reps, R 14 reps,                       Pelvic Floor Special Questions - 04/12/20 1045    External Perineal Exam tightness at suspensory ligament on L, mons pubis, ischiocavernosus L compared to R   no pelvic tightness at anterior/ posterior mm B            [facility] Adult PT Treatment/Exercise - 04/12/20 1046      Manual Therapy   Manual therapy comments STM/MWM at tightness at suspensory ligament on L, mons pubis, ischiocavernosus L                       PT Long Term Goals - 04/11/20 1616      PT LONG TERM GOAL #1   Title Pt will be able to stand for long hours 3-4 hours without sciatica in order to go shopping with family    Time 8    Period Weeks    Status Achieved      PT LONG TERM GOAL #2   Title Pt will demo increased L sideflexion  ( L  48  cm  - > 46 cm digit III to floor) and demo minimize spinal deviations to progress to deep core strengthening exercises for postural stability and pelvic floor mobility  ( 04/11/20:  45 cm)    Time 6    Period Weeks    Status Achieved      PT LONG TERM GOAL #3   Title Pt will demo proper sitting posture without cues in order to minimize slumped sitting, posterior tightness of pelvic floor to optimize pelvic floor function and optimize deep core mm for less back pain    Time 2    Period Weeks    Status Achieved      PT LONG TERM GOAL #4   Title Pt will demo proper deep core mm and pelvic floor coordination technique with no cues in order to maintain proper lengthening of pelvic floor mm    Time 4    Period Weeks    Status Achieved      PT LONG TERM GOAL #5   Title Pt will demo equal pelvic girdle alignment and decreased spinal scar restrictions in order to progress to proper core exercises to minmize overactvity of pelvic floor and completely finish urination without post urine dribble    Time 4    Period Weeks    Status Achieved      Additional Long Term Goals   Additional Long Term Goals Yes      PT LONG TERM GOAL #6    Title Pt will increase hip abduction strength from L 3+/5 , R 4/5 to B 5/5 in order to minimize overactivity of pelvic floor mm and increase pelvic girdle stability    Time 6    Period Weeks    Status Achieved      PT LONG TERM GOAL #7   Title Pt will show decreased abdominal separation from 2 fingers width to < 1 fingers width in order to improve IAP system for pelvic floor and lumbar function    Time 4    Period Weeks    Status Achieved      PT LONG TERM GOAL #8   Title Pt will demo proper alignment and technique without straining pelvic floor in order to return to gym settings and bike riding    Time 10    Period Weeks    Status New      PT LONG TERM GOAL  #9   TITLE Pt will improve his FOTO score for PFDI 4 pts to 0 pts  in order to improve QOL and pelvic function ( 04/11/20: 0 pts)    Time 10    Period Weeks    Status Achieved      PT LONG TERM GOAL  #10   TITLE Pt will demo increased PF strength without UE support, > 15 reps B in order to minimize overactivity of pelvic floor and have endurance being on his feet at work    Baseline PF with single UE support L 11 reps, R 13 reps    Time 10    Period Weeks    Status New    Target Date 06/20/20                 Plan - 04/12/20 1047    Clinical Impression Statement Pt demo'd significantly less pelvic floor tightness in anterior and posterior quadrants bilaterally. Assessment at suspensory ligament and pubic symphysis region on L was significantly tighter than R which may contribute to Peyronies curvature to  L and be associated with golfing. Pt demo'd decreased tightness post Tx and was recommended to perform complimentary stretches when golfing. Provided HEP for minimizing tightness at L hip in upright positions. Recommended pt to continue strengthening PF mm due to low arches and to continue strengthening L hip abduction strength which will also help minimize shortening of mm at anterior thigh/groin/pubic symphysis region on  L. Pt continues to benefit from skilled PT.     Examination-Activity Limitations Reach Overhead;Locomotion Level;Toileting;Stand;Lift;Carry    Examination-Participation Restrictions Yard Work    Conservation officer, historic buildings Evolving/Moderate complexity    Rehab Potential Good    PT Frequency 1x / week   10   PT Duration --   10   PT Treatment/Interventions ADLs/Self Care Home Management;Biofeedback;Cryotherapy;Moist Heat;Traction;Ultrasound;DME Instruction;Gait training;Stair training;Functional mobility training;Therapeutic activities;Therapeutic exercise;Balance training;Neuromuscular re-education;Patient/family education;Manual techniques;Passive range of motion;Dry needling;Energy conservation;Taping;Scar mobilization    PT Home Exercise Plan see above    Consulted and Agree with Plan of Care Patient           Patient will benefit from skilled therapeutic intervention in order to improve the following deficits and impairments:  Pain,Postural dysfunction,Decreased strength,Difficulty walking,Abnormal gait,Decreased activity tolerance,Decreased endurance,Decreased scar mobility,Decreased mobility,Decreased range of motion,Hypomobility,Impaired flexibility,Increased muscle spasms,Impaired sensation,Improper body mechanics,Increased fascial restricitons,Decreased safety awareness  Visit Diagnosis: Other idiopathic scoliosis, thoracolumbar region  Diastasis recti  Other abnormalities of gait and mobility  Muscle weakness (generalized)  Myalgia  Chronic midline low back pain, unspecified whether sciatica present  Chronic left-sided low back pain with left-sided sciatica     Problem List Patient Active Problem List   Diagnosis Date Noted  . Spondylolisthesis at L5-S1 level 11/14/2018  . Essential hypertension 03/30/2018  . Elevated blood sugar 08/15/2014  . GERD (gastroesophageal reflux disease) 08/15/2014  . External hemorrhoid 08/15/2014  . Hypercholesteremia  08/15/2014  . Hypotestosteronism 08/15/2014  . Cardiac murmur 08/15/2014  . Awareness of heartbeats 08/15/2014  . Avitaminosis D 08/15/2014    Mariane Masters ,PT, DPT, E-RYT  04/12/2020, 10:48 AM  [facility] MAIN [facility] SERVICES [address] [place], [place], [address] Phone: [phone]   Fax:  [phone]  Name: Eric Lane MRN: [id number] Date of Birth: [birth year]

## 2020-04-12 NOTE — Patient Instructions (Signed)
Leg swing on step  Hip flexor stretch on L  Complimentary stretches to opposite side when playing golf

## 2020-04-13 IMAGING — MR MR LUMBAR SPINE W/O CM
5 series · 32 of 48 positions shown · non-contrast
Comparison: Radiography 02/19/2017

CLINICAL DATA: Spondylolisthesis of lumbosacral region

EXAM:
MRI LUMBAR SPINE WITHOUT CONTRAST
TECHNIQUE: Multiplanar, multisequence MR imaging of the lumbar spine was
performed. No intravenous contrast was administered.

[Series 5: T2 · sagittal · 4.0mm · 0.88mm/px · 6 of 17 slices shown (1 of 2)]
[im 1/17]
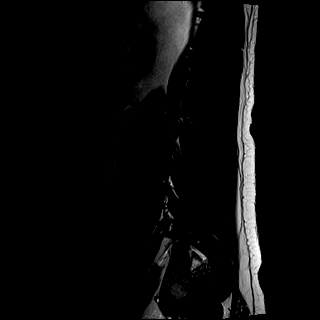
[im 4/17]
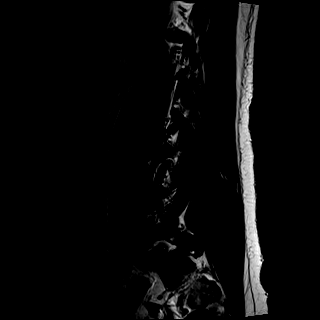
[im 7/17]
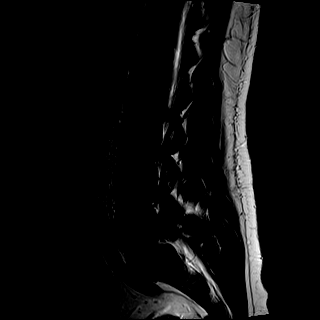
[im 10/17]
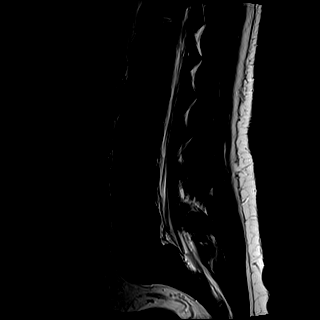
[im 13/17]
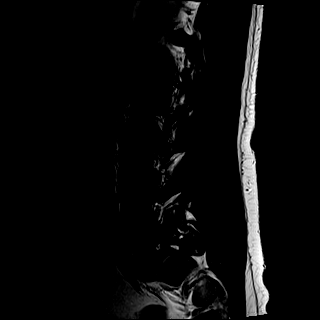
[im 17/17]
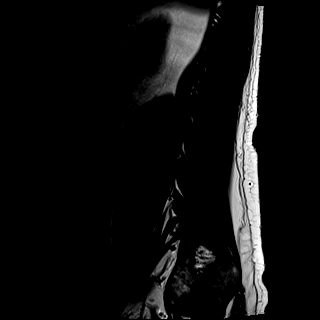

[Series 6: T1 · sagittal · 4.0mm · 0.88mm/px · 6 of 17 slices shown (1 of 2)]
[im 1/17]
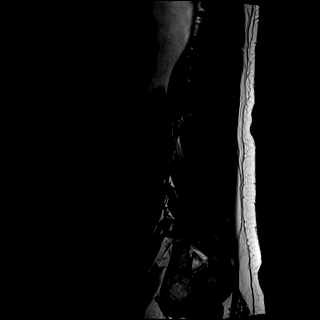
[im 4/17]
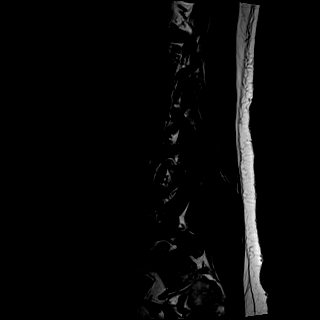
[im 7/17]
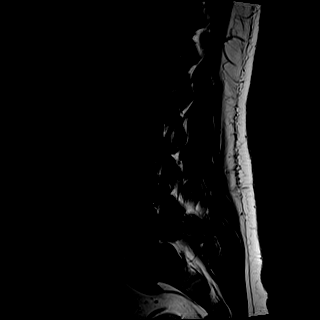
[im 10/17]
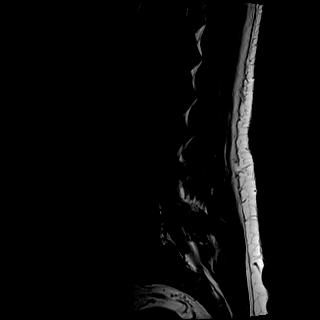
[im 13/17]
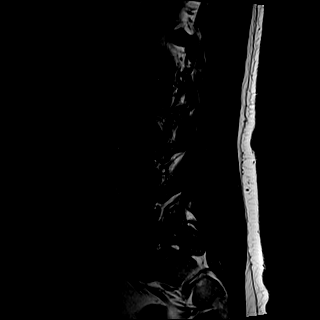
[im 17/17]
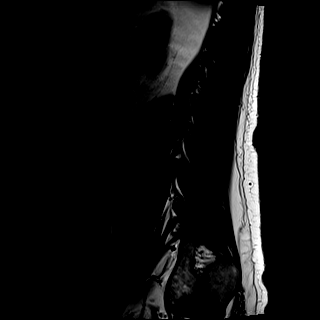

[Series 7: STIR · sagittal · 4.0mm · 0.44mm/px · 2 of 17 slices shown]
[im 1/17]
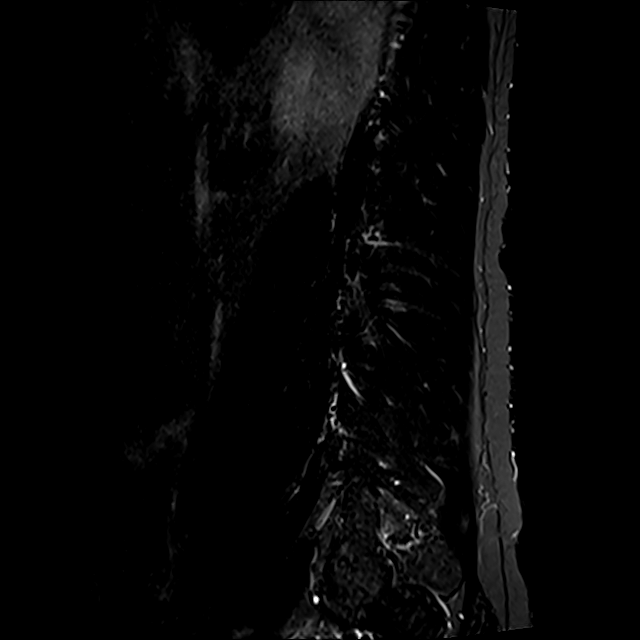
[im 4/17]
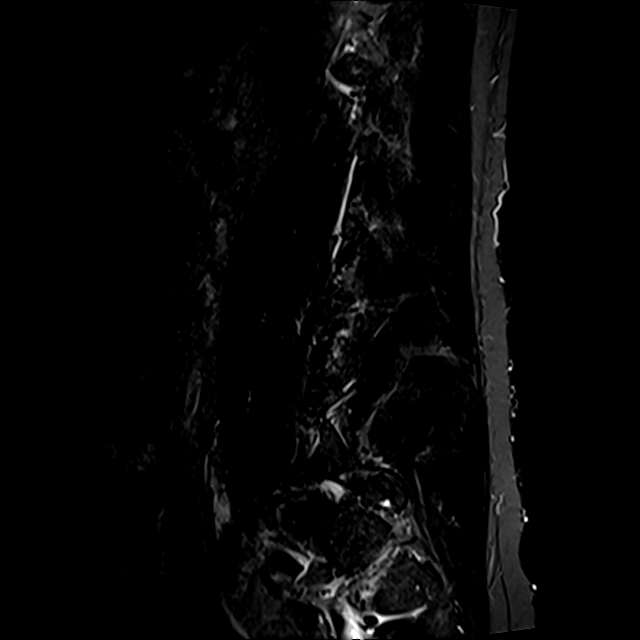

[Series 8: T2 · axial · 4.0mm · 0.78mm/px · z∈[-154,+75]mm · 9 of 40 slices shown (2 of 2)]
[im 1/40]
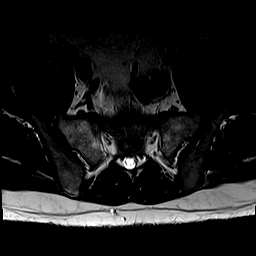
[im 6/40]
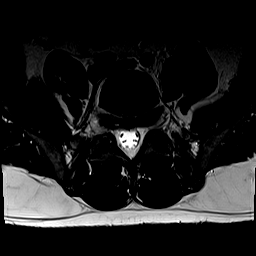
[im 12/40]
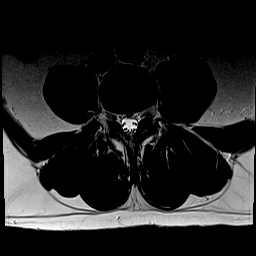
[im 17/40]
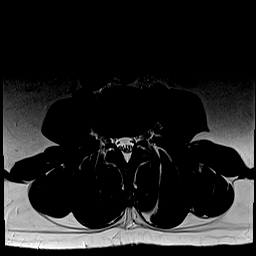
[im 20/40]
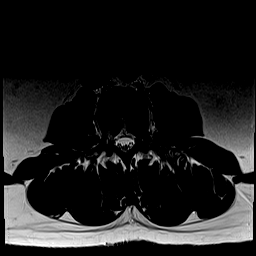
[im 23/40]
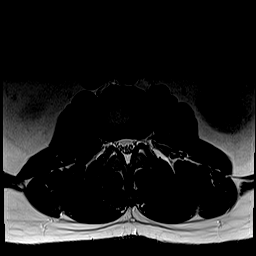
[im 28/40]
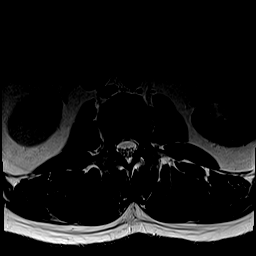
[im 34/40]
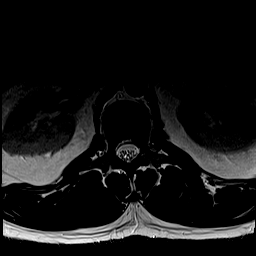
[im 40/40]
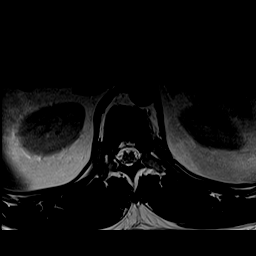

[Series 9: T1 · axial · 4.0mm · 0.39mm/px · z∈[-154,+75]mm · 9 of 40 slices shown (2 of 2)]
[im 1/40]
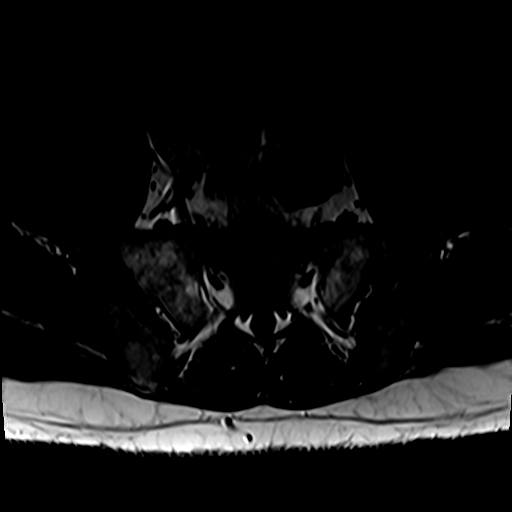
[im 6/40]
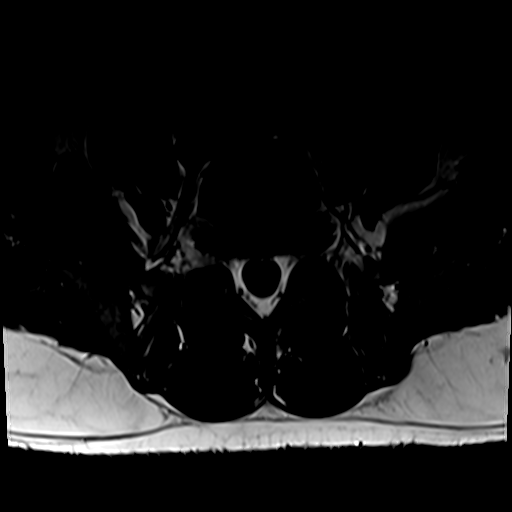
[im 12/40]
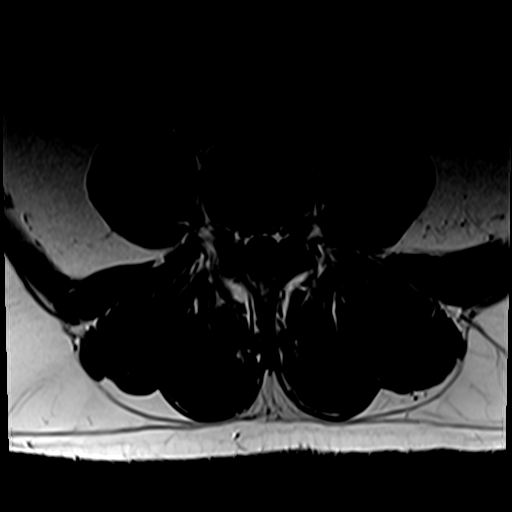
[im 17/40]
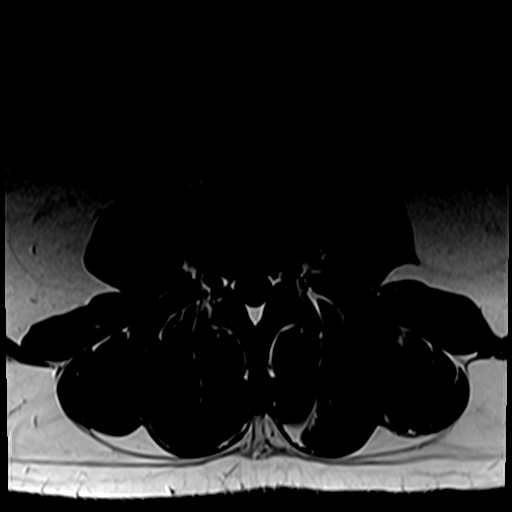
[im 20/40]
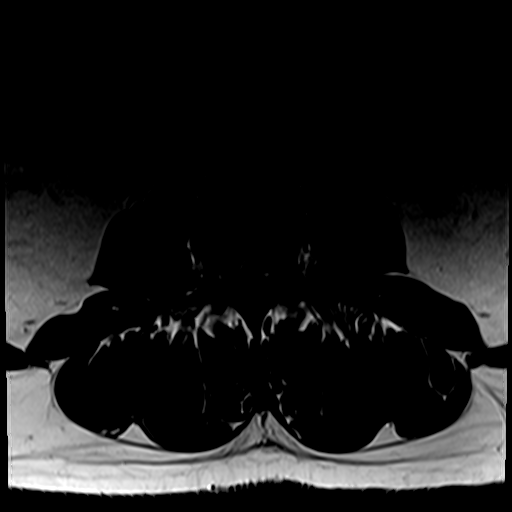
[im 23/40]
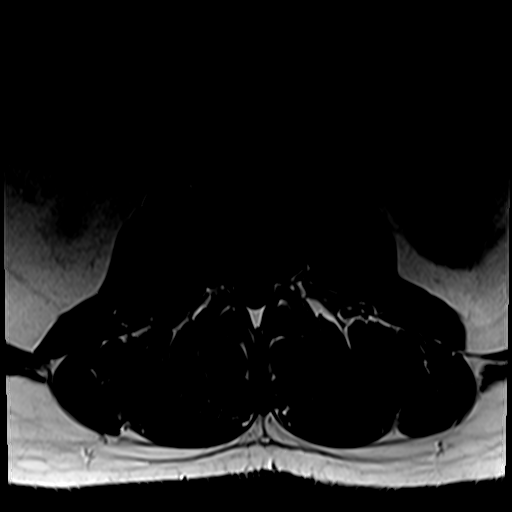
[im 28/40]
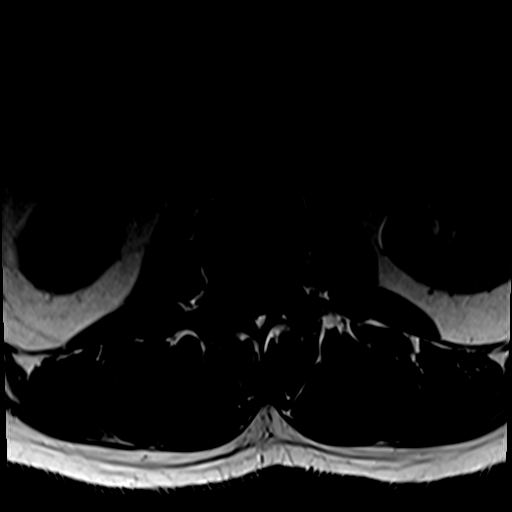
[im 34/40]
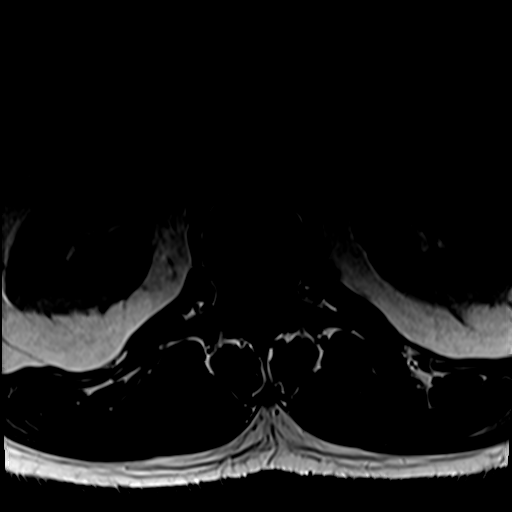
[im 40/40]
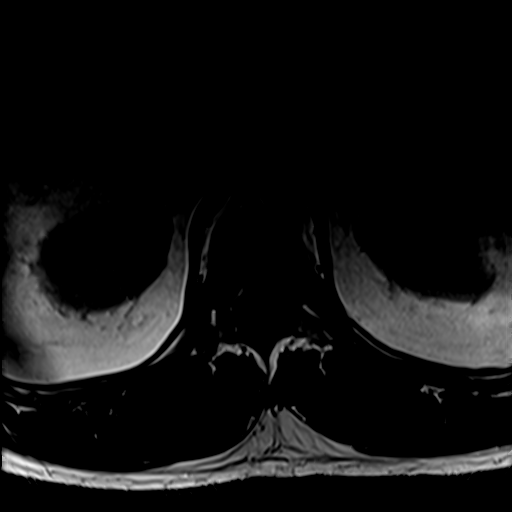

[32 of 48 positions shown; findings below may reference images not displayed]

FINDINGS: Segmentation:  5 lumbar type vertebral bodies

Alignment:  Grade 1 anterolisthesis at L5-S1

Vertebrae: Chronic bilateral L5 pars defects without marrow edema.
No acute fracture, discitis, or bone lesion.

Conus medullaris and cauda equina: Conus extends to the L1 level.
Conus and cauda equina appear normal.

Paraspinal and other soft tissues: Negative

Disc levels:

L5-S1 focal disc narrowing, desiccation, and bulge with annular
fissure. Listhesis and disc height loss causes biforaminal L5
compression.
IMPRESSION: L5 chronic bilateral pars defects. L5-S1 anterolisthesis and focal
disc degeneration with biforaminal L5 compression.

## 2020-04-16 ENCOUNTER — Ambulatory Visit: Payer: Commercial Managed Care - PPO | Admitting: Physical Therapy

## 2020-04-16 ENCOUNTER — Other Ambulatory Visit: Payer: Self-pay

## 2020-04-16 DIAGNOSIS — M6281 Muscle weakness (generalized): Secondary | ICD-10-CM

## 2020-04-16 DIAGNOSIS — M4125 Other idiopathic scoliosis, thoracolumbar region: Secondary | ICD-10-CM | POA: Diagnosis not present

## 2020-04-16 DIAGNOSIS — M791 Myalgia, unspecified site: Secondary | ICD-10-CM

## 2020-04-16 DIAGNOSIS — R2689 Other abnormalities of gait and mobility: Secondary | ICD-10-CM

## 2020-04-16 NOTE — Therapy (Signed)
[facility] MAIN [facility] SERVICES [address] [place], [place], [address] Phone: [phone]   Fax:  [phone]  Physical Therapy Treatment / Progress Note reporting from 01/30/20 to 04/16/20  Patient Details  Name: Eric Lane MRN: [id number] Date of Birth: March 07, 1980 Referring Provider (PT): Vaillancourt    Encounter Date: 04/16/2020   PT End of Session - 04/16/20 1826    Visit Number 10    Number of Visits 20    Date for PT Re-Evaluation 06/25/20    PT Start Time 1600    PT Stop Time 1700    PT Time Calculation (min) 60 min    Activity Tolerance Patient tolerated treatment well;No increased pain    Behavior During Therapy [facility] for tasks assessed/performed           No past medical history on file.  Past Surgical History:  Procedure Laterality Date  . BACK SURGERY     Fusion of L5/S1   . EYE SURGERY    . LASIK Bilateral 09/20/2014    There were no vitals filed for this visit.   Subjective Assessment - 04/16/20 1610    Subjective Pt notices less dribbling if he takes his time to relax and completerly finish urination.  Pt noticed after last session, the L pelvic area felt looser.    Pertinent History Hx of hemorrhoids and constipation in the past prior to surgery and pt thinks it is related to his poor wter intake. Denied straining with bowel movements. Daily fluid intake: 32 floz of water, 16 fl oz soda.  Per week: 2 cups of coffee,  4-5 beers.  Hobbies sports: golfing and biking but pt has not returned to road biking. Biked for 2-3 hours , 20-30 miles per week    Patient Stated Goals address post-void and peyronies issues, return to biking              [facility] PT Assessment - 04/16/20 1841      Strength   Overall Strength Comments hip abd L 4+/5                      Pelvic Floor Special Questions - 04/16/20 1841    External Perineal Exam no tightness at suspensory ligament on L, mons pubis, ischiocavernosus B  , minor tightness at R adductor and at ischial tuberosity   no pelvic tightness at anterior/ posterior mm B   External Palpation 3 sec, 5 reps in supine ,  5 quick reps in seated , cued for diaphragmatic excursion in seated ( less chest breathing)             [facility] Adult PT Treatment/Exercise - 04/16/20 1839      Therapeutic Activites    Other Therapeutic Activities explaiend kegel training , reassessed hip abduction strength, explained fitness phase , return to biking preparation to minimize relapse of Sx      Neuro Re-ed    Neuro Re-ed Details  cued for fascial mobilizations at anterior pelvic floor  mm with MWM, cued for pelvic floor endurance and quick contractions                       PT Long Term Goals - 04/16/20 1826      PT LONG TERM GOAL #1   Title Pt will be able to stand for long hours 3-4 hours without sciatica in order to go shopping with family    Time  8    Period Weeks    Status Achieved      PT LONG TERM GOAL #2   Title Pt will demo increased L sideflexion  ( L  48 cm  - > 46 cm digit III to floor) and demo minimize spinal deviations to progress to deep core strengthening exercises for postural stability and pelvic floor mobility  ( 04/11/20:  45 cm)    Time 6    Period Weeks    Status Achieved      PT LONG TERM GOAL #3   Title Pt will demo proper sitting posture without cues in order to minimize slumped sitting, posterior tightness of pelvic floor to optimize pelvic floor function and optimize deep core mm for less back pain    Time 2    Period Weeks    Status Achieved      PT LONG TERM GOAL #4   Title Pt will demo proper deep core mm and pelvic floor coordination technique with no cues in order to maintain proper lengthening of pelvic floor mm    Time 4    Period Weeks    Status Achieved      PT LONG TERM GOAL #5   Title Pt will demo equal pelvic girdle alignment and decreased spinal scar restrictions in order to progress to proper core  exercises to minmize overactvity of pelvic floor and completely finish urination without post urine dribble    Time 4    Period Weeks    Status Achieved      Additional Long Term Goals   Additional Long Term Goals Yes      PT LONG TERM GOAL #6   Title Pt will increase hip abduction strength from L 3+/5 , R 4/5 to B 5/5 in order to minimize overactivity of pelvic floor mm and increase pelvic girdle stability    Time 6    Period Weeks    Status Achieved      PT LONG TERM GOAL #7   Title Pt will show decreased abdominal separation from 2 fingers width to < 1 fingers width in order to improve IAP system for pelvic floor and lumbar function    Time 4    Period Weeks    Status Achieved      PT LONG TERM GOAL #8   Title Pt will demo proper alignment and technique without straining pelvic floor in order to return to gym settings and bike riding    Time 10    Period Weeks    Status New      PT LONG TERM GOAL  #9   TITLE Pt will improve his FOTO score for PFDI 4 pts to 0 pts  in order to improve QOL and pelvic function ( 04/11/20: 0 pts)    Time 10    Period Weeks    Status Achieved      PT LONG TERM GOAL  #10   TITLE Pt will demo increased PF strength without UE support, > 15 reps B in order to minimize overactivity of pelvic floor and have endurance being on his feet at work    Baseline PF with single UE support L 11 reps, R 13 reps    Time 10    Period Weeks    Status On-going      PT LONG TERM GOAL  #11   TITLE Pt will be IND with thoracolumbar/ glut strengthening resistance band and dumbbells techniques to minimize overactivty of pelvic  floor in order to return to fitness    Time 10    Period Weeks    Status New    Target Date 06/25/20                 Plan - 04/16/20 1610    Clinical Impression Statement Pt has achieved 9/11 goals with improvements with:  _ resolved LBP and regained ability to stand for 3-4 hrs without LBP  _improved urination with less post-urine  dribble _corrected spinal/ pelvic misalignments _improved deep core system with resolved diastasis recti ( more effective IAP system)  _decreased pelvic floor mm tightness _improved lower kinetic chain with less collapased arches, stronger instrinsic feet mm   Pt continues to require skilled PT to achieve remaining goals related to pelvic floor strength training as pt demo'd low endurance and related to return to fitness routine with less risk for relapse of increased tensions of pelvic floor mm and LBP.      Examination-Activity Limitations Reach Overhead;Locomotion Level;Toileting;Stand;Lift;Carry    Examination-Participation Restrictions Yard Work    Conservation officer, historic buildings Evolving/Moderate complexity    Rehab Potential Good    PT Frequency 1x / week   10   PT Duration --   10   PT Treatment/Interventions ADLs/Self Care Home Management;Biofeedback;Cryotherapy;Moist Heat;Traction;Ultrasound;DME Instruction;Gait training;Stair training;Functional mobility training;Therapeutic activities;Therapeutic exercise;Balance training;Neuromuscular re-education;Patient/family education;Manual techniques;Passive range of motion;Dry needling;Energy conservation;Taping;Scar mobilization    PT Home Exercise Plan see above    Consulted and Agree with Plan of Care Patient           Patient will benefit from skilled therapeutic intervention in order to improve the following deficits and impairments:  Pain,Postural dysfunction,Decreased strength,Difficulty walking,Abnormal gait,Decreased activity tolerance,Decreased endurance,Decreased scar mobility,Decreased mobility,Decreased range of motion,Hypomobility,Impaired flexibility,Increased muscle spasms,Impaired sensation,Improper body mechanics,Increased fascial restricitons,Decreased safety awareness  Visit Diagnosis: Other abnormalities of gait and mobility  Muscle weakness (generalized)  Myalgia     Problem List Patient Active Problem  List   Diagnosis Date Noted  . Spondylolisthesis at L5-S1 level 11/14/2018  . Essential hypertension 03/30/2018  . Elevated blood sugar 08/15/2014  . GERD (gastroesophageal reflux disease) 08/15/2014  . External hemorrhoid 08/15/2014  . Hypercholesteremia 08/15/2014  . Hypotestosteronism 08/15/2014  . Cardiac murmur 08/15/2014  . Awareness of heartbeats 08/15/2014  . Avitaminosis D 08/15/2014    Mariane Masters ,PT, DPT, E-RYT  04/16/2020, 6:44 PM  [facility] MAIN [facility] SERVICES [address] [place], [place], [address] Phone: [phone]   Fax:  [phone]  Name: Eric Lane MRN: [id number] Date of Birth: 06-Apr-1980

## 2020-04-16 NOTE — Patient Instructions (Signed)
PELVIC FLOOR / KEGEL EXERCISES   Pelvic floor/ Kegel exercises are used to strengthen the muscles in the base of your pelvis that are responsible for supporting your pelvic organs and preventing urine/feces leakage. Based on your therapist's recommendations, they can be performed while standing, sitting, or lying down.  Make yourself aware of this muscle group by using these cues:  Imagine you are in a crowded room and you feel the need to pass gas. Your response is to pull up and in at the rectum.  Close the rectum. Pull the muscles up inside your body,feeling your scrotum lifting as well . Feel the pelvic floor muscles lift as if you were walking into a cold lake.  Place your hand on top of your pubic bone. Tighten and draw in the muscles around the anal muscles without squeezing the buttock muscles.  Common Errors:  Breath holding: If you are holding your breath, you may be bearing down against your bladder instead of pulling it up. If you belly bulges up while you are squeezing, you are holding your breath. Be sure to breathe gently in and out while exercising. Counting out loud may help you avoid holding your breath.  Accessory muscle use: You should not see or feel other muscle movement when performing pelvic floor exercises. When done properly, no one can tell that you are performing the exercises. Keep the buttocks, belly and inner thighs relaxed.  Overdoing it: Your muscles can fatigue and stop working for you if you over-exercise. You may actually leak more or feel soreness at the lower abdomen or rectum.  YOUR HOME EXERCISE PROGRAM  LONG HOLDS: Position: on back  Inhale and then exhale. Then squeeze the muscle and count aloud for 3 seconds. Rest with three long breaths. (Be sure to let belly sink in with exhales and not push outward)  Perform 5  repetitions, 2   different times/day  SHORT HOLDS: Position: on  sitting   Inhale and then exhale. Then squeeze the muscle.  (Be  sure to let belly sink in with exhales and not push outward)  Perform 4repetitions, 3 different  Times/day  **ALSO SQUEEZE BEFORE YOUR SNEEZE, COUGH, LAUGH to decrease downward pressure   **ALSO EXHALE BEFORE YOU RISE AGAINST GRAVITY (lifting, sit to stand, from squat to stand)   ___  Fascial mobilization techniques at the pubic bone with leg movements ( esp after golf, bike)

## 2020-04-23 ENCOUNTER — Ambulatory Visit: Payer: Commercial Managed Care - PPO | Admitting: Physical Therapy

## 2020-05-07 ENCOUNTER — Ambulatory Visit: Payer: Commercial Managed Care - PPO | Attending: Physician Assistant | Admitting: Physical Therapy

## 2020-05-07 ENCOUNTER — Other Ambulatory Visit: Payer: Self-pay

## 2020-05-07 DIAGNOSIS — R2689 Other abnormalities of gait and mobility: Secondary | ICD-10-CM | POA: Insufficient documentation

## 2020-05-07 DIAGNOSIS — G8929 Other chronic pain: Secondary | ICD-10-CM | POA: Diagnosis present

## 2020-05-07 DIAGNOSIS — M6208 Separation of muscle (nontraumatic), other site: Secondary | ICD-10-CM | POA: Diagnosis present

## 2020-05-07 DIAGNOSIS — M6281 Muscle weakness (generalized): Secondary | ICD-10-CM | POA: Diagnosis present

## 2020-05-07 DIAGNOSIS — M545 Low back pain, unspecified: Secondary | ICD-10-CM | POA: Diagnosis present

## 2020-05-07 DIAGNOSIS — M791 Myalgia, unspecified site: Secondary | ICD-10-CM | POA: Insufficient documentation

## 2020-05-07 NOTE — Therapy (Signed)
[facility] MAIN [facility] SERVICES [address] [place], [place], [address] Phone: [phone]   Fax:  [phone]  Physical Therapy Treatment / Discharge Summary   Patient Details  Name: Eric Lane MRN: [id number] Date of Birth: 1980-04-10 Referring Provider (PT): Vaillancourt    Encounter Date: 05/07/2020   PT End of Session - 05/07/20 1711    Visit Number 11    Number of Visits 20    Date for PT Re-Evaluation 06/25/20    PT Start Time 1709    PT Stop Time 1800    PT Time Calculation (min) 51 min    Activity Tolerance Patient tolerated treatment well;No increased pain    Behavior During Therapy [facility] for tasks assessed/performed           No past medical history on file.  Past Surgical History:  Procedure Laterality Date  . BACK SURGERY     Fusion of L5/S1   . EYE SURGERY    . LASIK Bilateral 09/20/2014    There were no vitals filed for this visit.   Subjective Assessment - 05/07/20 1710    Subjective Pt has been stretching the penile suspensory ligament and penis and it feels less tight in this area. Pt has not noticed a change yet for Peyronies. Pt has been wearing his tennis shoes instead of steel toed shoes which has decreased his feet pain.    Pertinent History Hx of hemorrhoids and constipation in the past prior to surgery and pt thinks it is related to his poor wter intake. Denied straining with bowel movements. Daily fluid intake: 32 floz of water, 16 fl oz soda.  Per week: 2 cups of coffee,  4-5 beers.  Hobbies sports: golfing and biking but pt has not returned to road biking. Biked for 2-3 hours , 20-30 miles per week    Patient Stated Goals address post-void and peyronies issues, return to biking              [facility] PT Assessment - 05/07/20 1752      Squat   Comments no lumbar lordosis      Strength   Overall Strength Comments 7 reps on R, 11 on L without UE support, with UE support 15 reps on R, 20 reps on L)                          [facility] Adult PT Treatment/Exercise - 05/07/20 1753      Therapeutic Activites    Other Therapeutic Activities reassesed goals, discussed d/c      Neuro Re-ed    Neuro Re-ed Details  cued for theraband fitness exercises for thoracolumbar/ glut strengthening                       PT Long Term Goals - 05/07/20 1712      PT LONG TERM GOAL #1   Title Pt will be able to stand for long hours 3-4 hours without sciatica in order to go shopping with family    Time 8    Period Weeks    Status Achieved      PT LONG TERM GOAL #2   Title Pt will demo increased L sideflexion  ( L  48 cm  - > 46 cm digit III to floor) and demo minimize spinal deviations to progress to deep core strengthening exercises for postural stability and pelvic floor mobility  (  04/11/20:  45 cm)    Time 6    Period Weeks    Status Achieved      PT LONG TERM GOAL #3   Title Pt will demo proper sitting posture without cues in order to minimize slumped sitting, posterior tightness of pelvic floor to optimize pelvic floor function and optimize deep core mm for less back pain    Time 2    Period Weeks    Status Achieved      PT LONG TERM GOAL #4   Title Pt will demo proper deep core mm and pelvic floor coordination technique with no cues in order to maintain proper lengthening of pelvic floor mm    Time 4    Period Weeks    Status Achieved      PT LONG TERM GOAL #5   Title Pt will demo equal pelvic girdle alignment and decreased spinal scar restrictions in order to progress to proper core exercises to minmize overactvity of pelvic floor and completely finish urination without post urine dribble    Time 4    Period Weeks    Status Achieved      PT LONG TERM GOAL #6   Title Pt will increase hip abduction strength from L 3+/5 , R 4/5 to B 5/5 in order to minimize overactivity of pelvic floor mm and increase pelvic girdle stability    Time 6    Period Weeks    Status  Achieved      PT LONG TERM GOAL #7   Title Pt will show decreased abdominal separation from 2 fingers width to < 1 fingers width in order to improve IAP system for pelvic floor and lumbar function    Time 4    Period Weeks    Status Achieved      PT LONG TERM GOAL #8   Title Pt will demo proper alignment and technique without straining pelvic floor in order to return to gym settings and bike riding    Time 10    Period Weeks    Status Achieved      PT LONG TERM GOAL  #9   TITLE Pt will improve his FOTO score for PFDI 4 pts to 0 pts  in order to improve QOL and pelvic function ( 04/11/20: 0 pts)    Time 10    Period Weeks    Status Achieved      PT LONG TERM GOAL  #10   TITLE Pt will demo increased PF strength without UE support, > 15 reps B in order to minimize overactivity of pelvic floor and have endurance being on his feet at work    Baseline PF with single UE support L 11 reps, R 13 reps  ( 3/8: 7 reps on R, 11 on L without UE support, with UE support 15 reps on R, 20 reps on L)    Time 10    Period Weeks    Status Achieved      PT LONG TERM GOAL  #11   TITLE Pt will be IND with thoracolumbar/ glut strengthening resistance band and dumbbells techniques to minimize overactivty of pelvic floor in order to return to fitness    Time 10    Period Weeks    Status Achieved                Plan - 05/07/20 1741    Clinical Impression Statement Pt has achieved 100% of his goals with improvements with:  _ resolved LBP and regained ability to stand for 3-4 hrs without LBP  _improved urination with less post-urine dribble _corrected spinal/ pelvic misalignments _improved deep core system with resolved diastasis recti ( more effective IAP system)  _decreased pelvic floor mm tightness _improved lower kinetic chain with less collapased arches, stronger instrinsic feet mm  _improved FOTO scores indicating pelvic function  Pt has been stretching the penile suspensory ligament  and penis and there is less tightness in this area. Pt has not noticed a change yet for Peyronies. Referred pt to urologist for more Tx options for Peyronies.   Educated pt today with resistance band strengthening for thoracolumbar/ glut system in order to integrate back to gym routine when he is ready. Pt demo'd less lumbar lordosis without cues which indicate stronger back mm. Pt demo'd IND with training to band HEP.  Pt is ready for d/c today.          Examination-Activity Limitations Reach Overhead;Locomotion Level;Toileting;Stand;Lift;Carry    Examination-Participation Restrictions Yard Work    Conservation officer, historic buildings Evolving/Moderate complexity    Rehab Potential Good    PT Frequency 1x / week   10   PT Duration --   10   PT Treatment/Interventions ADLs/Self Care Home Management;Biofeedback;Cryotherapy;Moist Heat;Traction;Ultrasound;DME Instruction;Gait training;Stair training;Functional mobility training;Therapeutic activities;Therapeutic exercise;Balance training;Neuromuscular re-education;Patient/family education;Manual techniques;Passive range of motion;Dry needling;Energy conservation;Taping;Scar mobilization    PT Home Exercise Plan see above    Consulted and Agree with Plan of Care Patient           Patient will benefit from skilled therapeutic intervention in order to improve the following deficits and impairments:  Pain,Postural dysfunction,Decreased strength,Difficulty walking,Abnormal gait,Decreased activity tolerance,Decreased endurance,Decreased scar mobility,Decreased mobility,Decreased range of motion,Hypomobility,Impaired flexibility,Increased muscle spasms,Impaired sensation,Improper body mechanics,Increased fascial restricitons,Decreased safety awareness  Visit Diagnosis: Other abnormalities of gait and mobility  Muscle weakness (generalized)  Myalgia  Diastasis recti  Chronic midline low back pain, unspecified whether sciatica  present     Problem List Patient Active Problem List   Diagnosis Date Noted  . Spondylolisthesis at L5-S1 level 11/14/2018  . Essential hypertension 03/30/2018  . Elevated blood sugar 08/15/2014  . GERD (gastroesophageal reflux disease) 08/15/2014  . External hemorrhoid 08/15/2014  . Hypercholesteremia 08/15/2014  . Hypotestosteronism 08/15/2014  . Cardiac murmur 08/15/2014  . Awareness of heartbeats 08/15/2014  . Avitaminosis D 08/15/2014    Mariane Masters ,PT, DPT, E-RYT  05/07/2020, 5:57 PM  [facility] MAIN [facility] SERVICES [address] [place], [place], [address] Phone: [phone]   Fax:  [phone]  Name: Eric Lane MRN: [id number] Date of Birth: Nov 09, 1980

## 2020-05-07 NOTE — Patient Instructions (Signed)
Black band   Arm position in "w" as lat pull down band over door  Mini squat, more pressure through ballmounds  Exhale on the rise   30 reps     ___  Arms in "w" ( make sure to keep along same plane as shoulders)  Lateral lunges and extend elbows   30 reps   ___  Squat, bicep curl  30 reps

## 2020-05-13 ENCOUNTER — Telehealth: Payer: Self-pay | Admitting: Urology

## 2020-05-13 MED ORDER — DIAZEPAM 10 MG PO TABS: 10.0000 mg | ORAL_TABLET | Freq: Once | ORAL | 0 refills | Status: AC

## 2020-05-13 NOTE — Telephone Encounter (Signed)
Done, please let him know  Vanna Scotland, MD

## 2020-05-13 NOTE — Telephone Encounter (Signed)
Patient is scheduled for a vasectomy with you on 05/15/20 he is asking for a valium to be called into the [facility] on [address], [place]

## 2020-05-14 NOTE — Telephone Encounter (Signed)
Patient advised.

## 2020-05-15 ENCOUNTER — Ambulatory Visit: Payer: Commercial Managed Care - PPO | Admitting: Urology

## 2020-05-15 ENCOUNTER — Other Ambulatory Visit: Payer: Self-pay

## 2020-05-15 ENCOUNTER — Encounter: Payer: Self-pay | Admitting: Urology

## 2020-05-15 VITALS — BP 179/84 | HR 120

## 2020-05-15 DIAGNOSIS — Z302 Encounter for sterilization: Secondary | ICD-10-CM | POA: Diagnosis not present

## 2020-05-15 NOTE — Patient Instructions (Signed)
Vasectomy, Care After This sheet gives you information about how to care for yourself after your procedure. Your health care provider may also give you more specific instructions. If you have problems or questions, contact your health care provider. What can I expect after the procedure? After the procedure, it is common to have:  Mild pain, swelling, or discomfort in your scrotum or redness on your scrotum.  Some blood coming from your incisions or puncture sites for 1 or 2 days.  Blood in your semen. Follow these instructions at home: Medicines  Take over-the-counter and prescription medicines only as told by your health care provider.  Avoid taking any medicines that contain aspirin or NSAIDs, such as ibuprofen. These medicines can make bleeding worse. Activity  For the first 2 days after surgery, avoid physical activity and exercise that requires a lot of energy. Ask your health care provider what activities are safe for you.  Do not take part in sports or perform heavy physical labor until your pain has improved, or until your health care provider says it is okay.  You may have limits on the amount of weight you can lift as told by your health care provider.  Do not ejaculate for at least 1 week after the procedure, or for as long as you are told.  You may resume sexual activity 7-10 days after your procedure, or when your health care provider approves. Use a different method of birth control (contraception) until you have had test results that confirm that there is no sperm in your semen. Scrotal support  Use scrotal support, such as a jockstrap or underwear with a supportive pouch, as needed for 1 week after your procedure.  If you feel discomfort in your scrotum, you may remove the scrotal support to see if the discomfort is relieved. Sometimes scrotal support can press on the scrotum and cause or worsen discomfort.  If your skin gets irritated, you may add some germ-free  (sterile), fluffed bandages or a clean washcloth to the scrotal support. Managing pain and swelling If directed, put ice on the affected area. To do this:  Put ice in a plastic bag.  Place a towel between your skin and the bag.  Leave the ice on for 20 minutes, 2-3 times a day.  Remove the ice if your skin turns bright red. This is very important. If you cannot feel pain, heat, or cold, you have a greater risk of damage to the area.   General instructions  Check your incisions or puncture sites every day for signs of infection. Check for: ? Redness, swelling, or more pain. ? Fluid or blood. ? Warmth. ? Pus or a bad smell.  Leave stitches (sutures) in place. The sutures will dissolve on their own and do not need to be removed.  Keep all follow-up visits. This is important because you will need a test to confirm that there is no sperm in your semen. Multiple ejaculations are needed to clear out sperm that were beyond the vasectomy site. You will need one test result showing that there is no sperm in your semen before you can resume unprotected sex. This may take 2-4 months after your procedure.  If you were given a sedative during the procedure, it can affect you for several hours. Do not drive or operate machinery until your health care provider says that it is safe.   Contact a health care provider if:  You have redness, swelling, or more pain around an incision   or puncture site, or in your scrotum area.  You have bleeding from an incision or puncture site.  You have pus or a bad smell coming from an incision or puncture site.  You have a fever.  An incision or puncture site opens up. Get help right away if:  You develop a rash.  You have trouble breathing. Summary  After your procedure, it is common to have mild pain, swelling, redness, or discomfort in your scrotum.  For the first 2 days after surgery, avoid physical activity and exercise that requires a lot of  energy.  Put ice on the affected area. Leave the ice on for 20 minutes, 2-3 times a day.  If you were given a sedative during the procedure, it can affect you for several hours. Do not drive or operate machinery until your health care provider says that it is safe. This information is not intended to replace advice given to you by your health care provider. Make sure you discuss any questions you have with your health care provider. Document Revised: 07/06/2019 Document Reviewed: 07/06/2019 [employer] Patient Education  2021 [employer]    

## 2020-05-15 NOTE — Progress Notes (Signed)
05/15/20  CC:  Chief Complaint  Patient presents with  . VAS    HPI: 40 year old male who presents today for vasectomy.  All questions answered.  Consent was confirmed.  Timeout was performed.  NED. A&Ox3.   No respiratory distress   Abd soft, NT, ND Normal external genitalia with patent urethral meatus   Bilateral Vasectomy Procedure  Pre-Procedure: - Patient's scrotum was prepped and draped for vasectomy. - The vas was palpated through the scrotal skin on the left. - 1% Xylocaine was injected into the skin and surrounding tissue for placement  - In a similar manner, the vas on the right was identified, anesthetized, and stabilized.  Procedure: - A #11 blade was used to make a small stab incision in the skin overlying the vas - The left vas was isolated and brought up through the incision exposing that structure. - Bleeding points were cauterized as they occurred. - The vas was free from the surrounding structures and brought to the view. - A segment was positioned for placement with a hemostat. - A second hemostat was placed and a small segment between the two hemostats and was removed for inspection. - Each end of the transected vas lumen was fulgurated/ obliterated using needlepoint electrocautery -A fascial interposition was performed on testicular end of the vas using #3-0 chromic suture -The same procedure was performed on the right. - A single suture of #3-0 chromic catgut was used to close each lateral scrotal skin incision - A dressing was applied.  Post-Procedure: - Patient was instructed in care of the operative area - A specimen is to be delivered in 12 weeks   -Another form of contraception is to be used until post vasectomy semen analysis  Vanna Scotland, MD

## 2020-05-16 ENCOUNTER — Encounter: Payer: Self-pay | Admitting: Urology

## 2020-05-28 ENCOUNTER — Encounter: Payer: Commercial Managed Care - PPO | Admitting: Physical Therapy

## 2020-06-11 ENCOUNTER — Encounter: Payer: Commercial Managed Care - PPO | Admitting: Physical Therapy

## 2020-06-18 ENCOUNTER — Other Ambulatory Visit: Payer: Self-pay | Admitting: Family Medicine

## 2020-06-18 DIAGNOSIS — Z9852 Vasectomy status: Secondary | ICD-10-CM

## 2020-06-19 ENCOUNTER — Other Ambulatory Visit: Payer: Self-pay

## 2020-06-19 ENCOUNTER — Encounter: Payer: Self-pay | Admitting: Adult Health

## 2020-06-19 ENCOUNTER — Ambulatory Visit (INDEPENDENT_AMBULATORY_CARE_PROVIDER_SITE_OTHER): Payer: Commercial Managed Care - PPO | Admitting: Adult Health

## 2020-06-19 VITALS — BP 126/75 | HR 85 | Resp 15 | Wt 225.6 lb

## 2020-06-19 DIAGNOSIS — H6993 Unspecified Eustachian tube disorder, bilateral: Secondary | ICD-10-CM

## 2020-06-19 DIAGNOSIS — J302 Other seasonal allergic rhinitis: Secondary | ICD-10-CM | POA: Diagnosis not present

## 2020-06-19 DIAGNOSIS — H6501 Acute serous otitis media, right ear: Secondary | ICD-10-CM | POA: Insufficient documentation

## 2020-06-19 MED ORDER — AMOXICILLIN-POT CLAVULANATE 875-125 MG PO TABS: 1.0000 | ORAL_TABLET | Freq: Two times a day (BID) | ORAL | 0 refills | Status: DC

## 2020-06-19 MED ORDER — LEVOCETIRIZINE DIHYDROCHLORIDE 5 MG PO TABS: 5.0000 mg | ORAL_TABLET | Freq: Every evening | ORAL | 1 refills | Status: DC

## 2020-06-19 MED ORDER — PREDNISONE 10 MG (21) PO TBPK: ORAL_TABLET | ORAL | 0 refills | Status: DC

## 2020-06-19 NOTE — Patient Instructions (Addendum)
Debrox in right ear after infection is cleared and return for irrigation of wax if needed.    Levocetirizine oral solution What is this medicine? LEVOCETIRIZINE (LEE voe se TIR i zeen) is an antihistamine. This medicine is used to treat or prevent symptoms of allergies. It is also used to help reduce itchy skin rash and hives. This medicine may be used for other purposes; ask your health care provider or pharmacist if you have questions. COMMON BRAND NAME(S): Xyzal, Xyzal Children's Allergy 24 Hour What should I tell my health care provider before I take this medicine? They need to know if you have any of these conditions:  kidney disease  an unusual or allergic reaction to levocetirizine, cetirizine, hydroxyzine, other medicines, foods, dyes, or preservatives  pregnant or trying to get pregnant  breast-feeding How should I use this medicine? Take this medicine by mouth. Take it at night. Follow the directions on the prescription label. Use a specially marked spoon or container to measure your medicine. Ask your pharmacist if you do not have one. Household spoons are not accurate. You can take it with or without food. Do not take it more often than directed. You may need to take this medicine for several days before your symptoms improve. Talk to your pediatrician regarding the use of this medicine in children. While this drug may be prescribed for children as young as 61 months of age for selected condition, precautions do apply. Overdosage: If you think you have taken too much of this medicine contact a poison control center or emergency room at once. NOTE: This medicine is only for you. Do not share this medicine with others. What if I miss a dose? If you miss a dose, take it as soon as you can. If it is almost time for your next dose, take only that dose. Do not take double or extra doses. What may interact with this medicine?  alcohol  MAOIs like Carbex, Eldepryl, Marplan, Nardil,  and Parnate  medicines that cause drowsiness or sleep  other medicines for colds or allergies  ritonavir  theophylline This list may not describe all possible interactions. Give your health care provider a list of all the medicines, herbs, non-prescription drugs, or dietary supplements you use. Also tell them if you smoke, drink alcohol, or use illegal drugs. Some items may interact with your medicine. What should I watch for while using this medicine? Visit your doctor or health care professional for regular checks on your health. Tell your doctor or healthcare professional if your symptoms do not start to get better or if they get worse. You may get drowsy or dizzy. Do not drive, use machinery, or do anything that needs mental alertness until you know how this medicine affects you. Do not stand or sit up quickly, especially if you are an older patient. This reduces the risk of dizzy or fainting spells. Alcohol may interfere with the effect of this medicine. Avoid alcoholic drinks. Your mouth may get dry. Chewing sugarless gum or sucking hard candy, and drinking plenty of water may help. Contact your doctor if the problem does not go away or is severe. What side effects may I notice from receiving this medicine? Side effects that you should report to your doctor or health care professional as soon as possible:  allergic reactions like skin rash, itching or hives, swelling of the face, lips, or tongue  changes in vision or hearing  fever  trouble passing urine or change in the amount  of urine Side effects that usually do not require medical attention (report to your doctor or health care professional if they continue or are bothersome):  cough  dizziness  drowsiness or tiredness  dry mouth  muscle aches This list may not describe all possible side effects. Call your doctor for medical advice about side effects. You may report side effects to FDA at 1-800-FDA-1088. Where should I  keep my medicine? Keep out of the reach of children. Store at room temperature between 15 and 30 degrees C (59 and 86 degrees F). Throw away any unused medicine after the expiration date. NOTE: This sheet is a summary. It may not cover all possible information. If you have questions about this medicine, talk to your doctor, pharmacist, or health care provider.  2021 Elsevier/Gold Standard (2007-11-09 11:10:25) Allergies, Adult An allergy means that your body reacts to something that bothers it (allergen). This can happen from something that you eat, breathe in, or touch. Allergies often affect the nose, eyes, skin, and stomach. They can be mild, moderate, or very bad (severe). An allergy cannot spread from person to person. They can happen at any age. Sometimes, people outgrow them. What are the causes?  Outdoor things, such as pollen, car fumes, and mold.  Indoor things, such as dust, smoke, mold, and pets.  Foods.  Medicines.  Things that bother your skin, such as perfume and bug bites. What increases the risk?  Having family members with allergies or asthma. What are the signs or symptoms? Symptoms depend on how bad your allergy is. Mild to moderate symptoms  Runny nose, stuffy nose, or sneezing.  Itchy mouth, ears, or throat.  A feeling of mucus dripping down the back of your throat.  Sore throat.  Eyes that are itchy, red, watery, or puffy.  A skin rash, or red, swollen areas of skin (hives).  Stomach cramps or bloating. Severe symptoms Very bad allergies to food, medicine, or bug bites may cause a very bad allergy reaction (anaphylaxis). This can be life-threatening. Symptoms include:  A red face.  Wheezing or coughing.  Swollen lips, tongue, or mouth.  Tight or swollen throat.  Chest pain or tightness, or a fast heartbeat.  Trouble breathing or shortness of breath.  Pain in your belly (abdomen), vomiting, or watery poop (diarrhea).  Feeling dizzy or  fainting. How is this treated? Treatment for this condition depends on your symptoms. Treatment may include:  Cold, wet cloths for itching and swelling.  Eye drops, nose sprays, or skin creams.  Washing out your nose each day.  A humidifier.  Medicines.  A change to the foods you eat.  Being exposed again and again to tiny amounts of allergens. This helps your body get used to them. You might have: ? Allergy shots. ? Very small amounts of allergen put under your tongue.  An emergency shot (auto-injector pen) if you have a very bad allergy reaction. ? This is a medicine with a needle. You can put it into your skin by yourself. ? Your doctor will teach you how to use it.      Follow these instructions at home: Medicines  Take or apply over-the-counter and prescription medicines only as told by your doctor.  If you are at risk for a very bad allergy reaction, keep an auto-injector pen with you all the time.   Eating and drinking  Follow instructions from your doctor about what to eat and drink.  Drink enough fluid to keep your pee (  urine) pale yellow. General instructions  If you have ever had a very bad allergy reaction, wear a medical alert bracelet or necklace.  Stay away from things that you are allergic to.  Keep all follow-up visits as told by your doctor. This is important. Contact a doctor if:  Your symptoms do not get better with treatment. Get help right away if:  You have symptoms of a very bad allergy reaction. These include: ? A swollen mouth, tongue, or throat. ? Pain or tightness in your chest. ? Trouble breathing. ? Being short of breath. ? Dizziness. ? Fainting. ? Very bad pain in your belly. ? Vomiting. ? Watery poop. These symptoms may be an emergency. Do not wait to see if the symptoms will go away. Get medical help right away. Call your local emergency services (911 in the [place] Do not drive yourself to the hospital. Summary  Take or  apply over-the-counter and prescription medicines only as told by your doctor.  Stay away from things you are allergic to.  If you are at risk for a very bad allergy reaction, carry an auto-injector pen all the time.  Wear a medical alert bracelet or necklace.  Very bad allergy reactions can be life-threatening. Get help right away. This information is not intended to replace advice given to you by your health care provider. Make sure you discuss any questions you have with your health care provider. Document Revised: 12/28/2018 Document Reviewed: 12/28/2018 Elsevier Patient Education  2021 [employer] Amoxicillin; Clavulanic Acid Tablets What is this medicine? AMOXICILLIN; CLAVULANIC ACID (a mox i SIL in; KLAV yoo lan ic AS id) is a penicillin antibiotic. It treats some infections caused by bacteria. It will not work for colds, the flu, or other viruses. This medicine may be used for other purposes; ask your health care provider or pharmacist if you have questions. COMMON BRAND NAME(S): Augmentin What should I tell my health care provider before I take this medicine? They need to know if you have any of these conditions:  kidney disease  liver disease  mononucleosis  stomach or intestine problems such as colitis  an unusual or allergic reaction to amoxicillin, other penicillin or cephalosporin antibiotics, clavulanic acid, other medicines, foods, dyes, or preservatives  pregnant or trying to get pregnant  breast-feeding How should I use this medicine? Take this drug by mouth. Take it as directed on the prescription label at the same time every day. Take it with food at the start of a meal or snack. Take all of this drug unless your health care provider tells you to stop it early. Keep taking it even if you think you are better. Talk to your health care provider about the use of this drug in children. While it may be prescribed for selected conditions, precautions do  apply. Overdosage: If you think you have taken too much of this medicine contact a poison control center or emergency room at once. NOTE: This medicine is only for you. Do not share this medicine with others. What if I miss a dose? If you miss a dose, take it as soon as you can. If it is almost time for your next dose, take only that dose. Do not take double or extra doses. What may interact with this medicine?  allopurinol  anticoagulants  birth control pills  methotrexate  probenecid This list may not describe all possible interactions. Give your health care provider a list of all the medicines, herbs, non-prescription drugs, or dietary  supplements you use. Also tell them if you smoke, drink alcohol, or use illegal drugs. Some items may interact with your medicine. What should I watch for while using this medicine? Tell your health care provider if your symptoms do not start to get better or if they get worse. This medicine may cause serious skin reactions. They can happen weeks to months after starting the medicine. Contact your health care provider right away if you notice fevers or flu-like symptoms with a rash. The rash may be red or purple and then turn into blisters or peeling of the skin. Or, you might notice a red rash with swelling of the face, lips or lymph nodes in your neck or under your arms. Do not treat diarrhea with over the counter products. Contact your health care provider if you have diarrhea that lasts more than 2 days or if it is severe and watery. If you have diabetes, you may get a false-positive result for sugar in your urine. Check with your health care provider. Birth control may not work properly while you are taking this medicine. Talk to your health care provider about using an extra method of birth control. What side effects may I notice from receiving this medicine? Side effects that you should report to your doctor or health care provider as soon as  possible:  allergic reactions (skin rash, itching or hives; swelling of the face, lips, or tongue)  bloody or watery diarrhea  dark urine  infection (fever, chills, cough, sore throat, or pain)  kidney injury (trouble passing urine or change in the amount of urine)  redness, blistering, peeling, or loosening of the skin, including inside the mouth  seizures  thrush (white patches in the mouth or mouth sores)  trouble breathing  unusual bruising or bleeding  unusually weak or tired Side effects that usually do not require medical attention (report to your doctor or health care provider if they continue or are bothersome):  diarrhea  dizziness  headache  nausea, vomiting  unusual vaginal discharge, itching, or odor  upset stomach This list may not describe all possible side effects. Call your doctor for medical advice about side effects. You may report side effects to FDA at 1-800-FDA-1088. Where should I keep my medicine? Keep out of the reach of children and pets. Store at room temperature between 20 and 25 degrees C (68 and 77 degrees F). Throw away any unused drug after the expiration date. NOTE: This sheet is a summary. It may not cover all possible information. If you have questions about this medicine, talk to your doctor, pharmacist, or health care provider.  2021 Elsevier/Gold Standard (2020-01-10 09:45:03) Otitis Media, Adult  Otitis media is a condition in which the middle ear is red and swollen (inflamed) and full of fluid. The middle ear is the part of the ear that contains bones for hearing as well as air that helps send sounds to the brain. The condition usually goes away on its own. What are the causes? This condition is caused by a blockage in the eustachian tube. The eustachian tube connects the middle ear to the back of the nose. It normally allows air into the middle ear. The blockage is caused by fluid or swelling. Problems that can cause blockage  include:  A cold or infection that affects the nose, mouth, or throat.  Allergies.  An irritant, such as tobacco smoke.  Adenoids that have become large. The adenoids are soft tissue located in the back of  the throat, behind the nose and the roof of the mouth.  Growth or swelling in the upper part of the throat, just behind the nose (nasopharynx).  Damage to the ear caused by change in pressure. This is called barotrauma. What are the signs or symptoms? Symptoms of this condition include:  Ear pain.  Fever.  Problems with hearing.  Being tired.  Fluid leaking from the ear.  Ringing in the ear. How is this treated? This condition can go away on its own within 3-5 days. But if the condition is caused by bacteria or does not go away on its own, or if it keeps coming back, your doctor may:  Give you antibiotic medicines.  Give you medicines for pain. Follow these instructions at home:  Take over-the-counter and prescription medicines only as told by your doctor.  If you were prescribed an antibiotic medicine, take it as told by your doctor. Do not stop taking the antibiotic even if you start to feel better.  Keep all follow-up visits as told by your doctor. This is important. Contact a doctor if:  You have bleeding from your nose.  There is a lump on your neck.  You are not feeling better in 5 days.  You feel worse instead of better. Get help right away if:  You have pain that is not helped with medicine.  You have swelling, redness, or pain around your ear.  You get a stiff neck.  You cannot move part of your face (paralysis).  You notice that the bone behind your ear hurts when you touch it.  You get a very bad headache. Summary  Otitis media means that the middle ear is red, swollen, and full of fluid.  This condition usually goes away on its own.  If the problem does not go away, treatment may be needed. You may be given medicines to treat the  infection or to treat your pain.  If you were prescribed an antibiotic medicine, take it as told by your doctor. Do not stop taking the antibiotic even if you start to feel better.  Keep all follow-up visits as told by your doctor. This is important. This information is not intended to replace advice given to you by your health care provider. Make sure you discuss any questions you have with your health care provider. Document Revised: 01/19/2019 Document Reviewed: 01/19/2019 Elsevier Patient Education  2021 [employer] Inc. Eustachian Tube Dysfunction  Eustachian tube dysfunction refers to a condition in which a blockage develops in the narrow passage that connects the middle ear to the back of the nose (eustachian tube). The eustachian tube regulates air pressure in the middle ear by letting air move between the ear and nose. It also helps to drain fluid from the middle ear space. Eustachian tube dysfunction can affect one or both ears. When the eustachian tube does not function properly, air pressure, fluid, or both can build up in the middle ear. What are the causes? This condition occurs when the eustachian tube becomes blocked or cannot open normally. Common causes of this condition include:  Ear infections.  Colds and other infections that affect the nose, mouth, and throat (upper respiratory tract).  Allergies.  Irritation from cigarette smoke.  Irritation from stomach acid coming up into the esophagus (gastroesophageal reflux). The esophagus is the tube that carries food from the mouth to the stomach.  Sudden changes in air pressure, such as from descending in an airplane or scuba diving.  Abnormal growths  in the nose or throat, such as: ? Growths that line the nose (nasal polyps). ? Abnormal growth of cells (tumors). ? Enlarged tissue at the back of the throat (adenoids). What increases the risk? You are more likely to develop this condition if:  You smoke.  You are  overweight.  You are a child who has: ? Certain birth defects of the mouth, such as cleft palate. ? Large tonsils or adenoids. What are the signs or symptoms? Common symptoms of this condition include:  A feeling of fullness in the ear.  Ear pain.  Clicking or popping noises in the ear.  Ringing in the ear.  Hearing loss.  Loss of balance.  Dizziness. Symptoms may get worse when the air pressure around you changes, such as when you travel to an area of high elevation, fly on an airplane, or go scuba diving. How is this diagnosed? This condition may be diagnosed based on:  Your symptoms.  A physical exam of your ears, nose, and throat.  Tests, such as those that measure: ? The movement of your eardrum (tympanogram). ? Your hearing (audiometry). How is this treated? Treatment depends on the cause and severity of your condition.  In mild cases, you may relieve your symptoms by moving air into your ears. This is called "popping the ears."  In more severe cases, or if you have symptoms of fluid in your ears, treatment may include: ? Medicines to relieve congestion (decongestants). ? Medicines that treat allergies (antihistamines). ? Nasal sprays or ear drops that contain medicines that reduce swelling (steroids). ? A procedure to drain the fluid in your eardrum (myringotomy). In this procedure, a small tube is placed in the eardrum to:  Drain the fluid.  Restore the air in the middle ear space. ? A procedure to insert a balloon device through the nose to inflate the opening of the eustachian tube (balloon dilation). Follow these instructions at home: Lifestyle  Do not do any of the following until your health care provider approves: ? Travel to high altitudes. ? Fly in airplanes. ? Work in a Estate agent or room. ? Scuba dive.  Do not use any products that contain nicotine or tobacco, such as cigarettes and e-cigarettes. If you need help quitting, ask your  health care provider.  Keep your ears dry. Wear fitted earplugs during showering and bathing. Dry your ears completely after. General instructions  Take over-the-counter and prescription medicines only as told by your health care provider.  Use techniques to help pop your ears as recommended by your health care provider. These may include: ? Chewing gum. ? Yawning. ? Frequent, forceful swallowing. ? Closing your mouth, holding your nose closed, and gently blowing as if you are trying to blow air out of your nose.  Keep all follow-up visits as told by your health care provider. This is important. Contact a health care provider if:  Your symptoms do not go away after treatment.  Your symptoms come back after treatment.  You are unable to pop your ears.  You have: ? A fever. ? Pain in your ear. ? Pain in your head or neck. ? Fluid draining from your ear.  Your hearing suddenly changes.  You become very dizzy.  You lose your balance. Summary  Eustachian tube dysfunction refers to a condition in which a blockage develops in the eustachian tube.  It can be caused by ear infections, allergies, inhaled irritants, or abnormal growths in the nose or throat.  Symptoms  include ear pain, hearing loss, or ringing in the ears.  Mild cases are treated with maneuvers to unblock the ears, such as yawning or ear popping.  Severe cases are treated with medicines. Surgery may also be done (rare). This information is not intended to replace advice given to you by your health care provider. Make sure you discuss any questions you have with your health care provider. Document Revised: 06/08/2017 Document Reviewed: 06/08/2017 Elsevier Patient Education  2021 [employer].

## 2020-06-19 NOTE — Progress Notes (Signed)
Established patient visit   Patient: Eric Lane   DOB: 1980/03/11   40 y.o. Male  MRN: [id number] Visit Date: 06/19/2020  Today's healthcare provider: Jairo Ben, FNP   Chief Complaint  Patient presents with  . Neck Pain   Subjective    HPI HPI    Neck Pain    Chronicity: recurrent problem   Injury: was not an injury that may have caused the pain   Onset: Onset 2 days ago. Cleared with Augmentin last time. Has had increasing sinus drainage and right ear pain.    Progression since onset: waxing and waning since onset   Location: right posterior neck   Radiating to: Right ear    Pain severity: mild   Frequency: intermittently   Aggravating factors: nothing   Relieving factors: nothing   Treatments: acetaminophen   Improvement on treatment: mild relief   Chest pain: Absent   Fever: Absent   Headaches: Absent   Joint pains: Absent   Numbness: Absent   Sore throat: Absent   Swallowing problems: Absent   Tingling : Absent   Weakness: Absent       Last edited by Fonda Kinder, CMA on 06/19/2020 11:36 AM. (History)     Denies any problems swallowing.  Patient  denies any fever, body aches,chills, rash, chest pain, shortness of breath, nausea, vomiting, or diarrhea.  Denies dizziness, lightheadedness, pre syncopal or syncopal episodes.    Patient Active Problem List   Diagnosis Date Noted  . Non-recurrent acute serous otitis media of right ear 06/19/2020  . Eustachian tube disorder, bilateral 06/19/2020  . Seasonal allergies 06/19/2020  . Spondylolisthesis at L5-S1 level 11/14/2018  . Essential hypertension 03/30/2018  . Elevated blood sugar 08/15/2014  . GERD (gastroesophageal reflux disease) 08/15/2014  . External hemorrhoid 08/15/2014  . Hypercholesteremia 08/15/2014  . Hypotestosteronism 08/15/2014  . Cardiac murmur 08/15/2014  . Awareness of heartbeats 08/15/2014  . Avitaminosis D 08/15/2014   History reviewed. No pertinent past  medical history. No Known Allergies     Medications: Outpatient Medications Prior to Visit  Medication Sig  . [DISCONTINUED] diazepam (VALIUM) 10 MG tablet Take 10 mg by mouth daily as needed.   No facility-administered medications prior to visit.    Review of Systems  Constitutional: Negative.   HENT: Positive for congestion, ear pain, postnasal drip, rhinorrhea and sore throat.   Respiratory: Negative.   Cardiovascular: Negative.   Gastrointestinal: Negative.   Genitourinary: Negative.   Musculoskeletal: Negative.   Neurological: Negative.   Psychiatric/Behavioral: Negative.     Last CBC Lab Results  Component Value Date   WBC 6.0 05/15/2019   HGB 15.1 05/15/2019   HCT 43.7 05/15/2019   MCV 82 05/15/2019   MCH 28.4 05/15/2019   RDW 13.0 05/15/2019   PLT 217 05/15/2019   Last metabolic panel Lab Results  Component Value Date   GLUCOSE 94 05/15/2019   NA 139 05/15/2019   K 4.4 05/15/2019   CL 100 05/15/2019   CO2 22 05/15/2019   BUN 18 05/15/2019   CREATININE 1.23 05/15/2019   GFRNONAA 75 05/15/2019   GFRAA 86 05/15/2019   CALCIUM 10.3 (H) 05/15/2019   PROT 7.6 05/15/2019   ALBUMIN 4.9 05/15/2019   LABGLOB 2.7 05/15/2019   AGRATIO 1.8 05/15/2019   BILITOT 0.4 05/15/2019   ALKPHOS 89 05/15/2019   AST 20 05/15/2019   ALT 17 05/15/2019   ANIONGAP 7 12/01/2017   Last lipids Lab Results  Component Value Date   CHOL 190 05/15/2019   HDL 43 05/15/2019   LDLCALC 120 (H) 05/15/2019   TRIG 153 (H) 05/15/2019   CHOLHDL 4.4 05/15/2019   Last hemoglobin A1c Lab Results  Component Value Date   HGBA1C 5.3 05/15/2019   Last thyroid functions Lab Results  Component Value Date   TSH 2.690 05/15/2019   Last vitamin D Lab Results  Component Value Date   VD25OH 24.4 (L) 05/15/2019   Last vitamin B12 and Folate No results found for: VITAMINB12, FOLATE     Objective    BP 126/75   Pulse 85   Resp 15   Wt 225 lb 9.6 oz (102.3 kg)   SpO2 98%   BMI  31.46 kg/m  BP Readings from Last 3 Encounters:  06/19/20 126/75  05/15/20 (!) 179/84  01/16/20 (!) 154/106   Wt Readings from Last 3 Encounters:  06/19/20 225 lb 9.6 oz (102.3 kg)  01/16/20 215 lb (97.5 kg)  06/20/19 224 lb (101.6 kg)       Physical Exam Vitals reviewed.  Constitutional:      General: He is not in acute distress.    Appearance: Normal appearance. He is not ill-appearing, toxic-appearing or diaphoretic.  HENT:     Head: Normocephalic and atraumatic.     Jaw: There is normal jaw occlusion.     Salivary Glands: Right salivary gland is not diffusely enlarged or tender. Left salivary gland is not diffusely enlarged or tender.     Right Ear: Hearing normal. A middle ear effusion is present. There is no impacted cerumen. Tympanic membrane is erythematous. Tympanic membrane is not perforated or bulging.     Left Ear: Hearing, ear canal and external ear normal.  No middle ear effusion. There is no impacted cerumen. Tympanic membrane is not perforated or bulging.     Ears:     Comments: Some cerumen outer canal hard and dark - right ear.     Nose: Nose normal. No congestion or rhinorrhea.     Mouth/Throat:     Mouth: Mucous membranes are moist.     Pharynx: Posterior oropharyngeal erythema present. No oropharyngeal exudate.  Eyes:     General: No scleral icterus.       Right eye: No discharge.        Left eye: No discharge.     Conjunctiva/sclera: Conjunctivae normal.  Cardiovascular:     Rate and Rhythm: Normal rate and regular rhythm.     Pulses: Normal pulses.     Heart sounds: Normal heart sounds. No murmur heard. No friction rub. No gallop.   Pulmonary:     Effort: Pulmonary effort is normal. No respiratory distress.     Breath sounds: Normal breath sounds. No stridor. No wheezing, rhonchi or rales.  Chest:     Chest wall: No tenderness.  Abdominal:     Palpations: Abdomen is soft.  Neurological:     Mental Status: He is alert.     No results found for  any visits on 06/19/20.  Assessment & Plan     Non-recurrent acute serous otitis media of right ear  Eustachian tube disorder, bilateral - Plan: CBC with Differential/Platelet, Comprehensive Metabolic Panel (CMET), amoxicillin-clavulanate (AUGMENTIN) 875-125 MG tablet, predniSONE (STERAPRED UNI-PAK 21 TAB) 10 MG (21) TBPK tablet, levocetirizine (XYZAL) 5 MG tablet  Seasonal allergies  Meds ordered this encounter  Medications  . amoxicillin-clavulanate (AUGMENTIN) 875-125 MG tablet    Sig: Take 1 tablet by mouth  2 (two) times daily.    Dispense:  20 tablet    Refill:  0  . predniSONE (STERAPRED UNI-PAK 21 TAB) 10 MG (21) TBPK tablet    Sig: PO: Take 6 tablets on day 1:Take 5 tablets day 2:Take 4 tablets day 3: Take 3 tablets day 4:Take 2 tablets day five: 5 Take 1 tablet day 6    Dispense:  21 tablet    Refill:  0  . levocetirizine (XYZAL) 5 MG tablet    Sig: Take 1 tablet (5 mg total) by mouth every evening.    Dispense:  90 tablet    Refill:  1  discontinue claritin. Try Xyzal.   Red Flags discussed. The patient was given clear instructions to go to ER or return to medical center if any red flags develop, symptoms do not improve, worsen or new problems develop. They verbalized understanding.   Return in about 1 week (around 06/26/2020), or if symptoms worsen or fail to improve, for at any time for any worsening symptoms, Go to Emergency room/ urgent care if worse.    The entirety of the information documented in the History of Present Illness, Review of Systems and Physical Exam were personally obtained by me. Portions of this information were initially documented by the CMA and reviewed by me for thoroughness and accuracy.     Jairo Ben, FNP  [facility] [phone] (phone) [phone] (fax)  [facility]

## 2020-06-25 ENCOUNTER — Encounter: Payer: Commercial Managed Care - PPO | Admitting: Physical Therapy

## 2020-08-11 ENCOUNTER — Emergency Department
Admission: EM | Admit: 2020-08-11 | Discharge: 2020-08-11 | Disposition: A | Discharge status: Home / Self Care | Payer: Commercial Managed Care - PPO | Attending: Emergency Medicine | Admitting: Emergency Medicine

## 2020-08-11 ENCOUNTER — Emergency Department: Payer: Commercial Managed Care - PPO

## 2020-08-11 ENCOUNTER — Other Ambulatory Visit: Payer: Self-pay

## 2020-08-11 ENCOUNTER — Encounter: Payer: Self-pay | Admitting: Emergency Medicine

## 2020-08-11 DIAGNOSIS — I10 Essential (primary) hypertension: Secondary | ICD-10-CM | POA: Diagnosis not present

## 2020-08-11 DIAGNOSIS — R079 Chest pain, unspecified: Secondary | ICD-10-CM

## 2020-08-11 DIAGNOSIS — R0789 Other chest pain: Secondary | ICD-10-CM | POA: Diagnosis present

## 2020-08-11 DIAGNOSIS — R531 Weakness: Secondary | ICD-10-CM | POA: Insufficient documentation

## 2020-08-11 DIAGNOSIS — F17220 Nicotine dependence, chewing tobacco, uncomplicated: Secondary | ICD-10-CM | POA: Insufficient documentation

## 2020-08-11 LAB — BASIC METABOLIC PANEL
Anion gap: 11 (ref 5–15)
BUN: 16 mg/dL (ref 6–20)
CO2: 24 mmol/L (ref 22–32)
Calcium: 9.1 mg/dL (ref 8.9–10.3)
Chloride: 100 mmol/L (ref 98–111)
Creatinine, Ser: 1.23 mg/dL (ref 0.61–1.24)
GFR, Estimated: >60 mL/min (ref >60)
Glucose, Bld: 110 mg/dL — ABNORMAL HIGH (ref 70–99)
Potassium: 3.6 mmol/L (ref 3.5–5.1)
Sodium: 135 mmol/L (ref 135–145)

## 2020-08-11 LAB — TROPONIN I (HIGH SENSITIVITY)
Troponin I (High Sensitivity): 3 ng/L (ref <18)
Troponin I (High Sensitivity): 4 ng/L (ref <18)

## 2020-08-11 LAB — CBC
HCT: 45.9 % (ref 39.0–52.0)
Hemoglobin: 15.4 g/dL (ref 13.0–17.0)
MCH: 28.3 pg (ref 26.0–34.0)
MCHC: 33.6 g/dL (ref 30.0–36.0)
MCV: 84.4 fL (ref 80.0–100.0)
Platelets: 220 10*3/uL (ref 150–400)
RBC: 5.44 MIL/uL (ref 4.22–5.81)
RDW: 12.9 % (ref 11.5–15.5)
WBC: 10.6 10*3/uL — ABNORMAL HIGH (ref 4.0–10.5)
nRBC: 0 % (ref 0.0–0.2)

## 2020-08-11 NOTE — ED Notes (Signed)
Patient verbalizes understanding of discharge instructions. Opportunity for questioning and answers were provided. Armband removed by staff, pt discharged from ED. Ambulated out to lobby  

## 2020-08-11 NOTE — ED Triage Notes (Addendum)
Pt via EMS from home. Pt c/o weakness, chest discomfort, and palpitation while he was at church this AM. Denies any cardiac hx. Pt is A&Ox4 and NAD.   EMS gave 324 of ASA.

## 2020-08-11 NOTE — ED Provider Notes (Signed)
[facility] Emergency Department Provider Note  ____________________________________________  Time seen: Approximately 4:20 PM  I have reviewed the triage vital signs and the nursing notes.   HISTORY  Chief Complaint Weakness and Chest Pain    HPI Eric Lane is a 40 y.o. male who presents the emergency department for complaint of of chest tightness, weakness.  Patient states that this morning he got up, drink 24 ounces of coffee and went to church without eating breakfast.  While at church she started to have some chest tightness and a weak sensation.  He denied it being a true frank pain but described more as a tightness sensation.  Not worse with movement, deep inspiration.  No trauma to the chest.  Patient denies any radiation of symptoms into the neck or down the arm.  No cardiac history.  Patient did have a family member that had a heart attack at a young age.  This point no medications prior to arrival.  Patient states that he is only a few bites of pizza and some crackers today.  He feels somewhat jittery at this time.  Patient denies any URI symptoms, fevers or chills, abdominal pain, nausea vomiting, diarrhea or constipation.       History reviewed. No pertinent past medical history.  Patient Active Problem List   Diagnosis Date Noted   Non-recurrent acute serous otitis media of right ear 06/19/2020   Eustachian tube disorder, bilateral 06/19/2020   Seasonal allergies 06/19/2020   Spondylolisthesis at L5-S1 level 11/14/2018   Essential hypertension 03/30/2018   Elevated blood sugar 08/15/2014   GERD (gastroesophageal reflux disease) 08/15/2014   External hemorrhoid 08/15/2014   Hypercholesteremia 08/15/2014   Hypotestosteronism 08/15/2014   Cardiac murmur 08/15/2014   Awareness of heartbeats 08/15/2014   Avitaminosis D 08/15/2014    Past Surgical History:  Procedure Laterality Date   BACK SURGERY     Fusion of L5/S1    EYE SURGERY      LASIK Bilateral 09/20/2014    Prior to Admission medications   Medication Sig Start Date End Date Taking? Authorizing Provider  amoxicillin-clavulanate (AUGMENTIN) 875-125 MG tablet Take 1 tablet by mouth 2 (two) times daily. 06/19/20   Flinchum, Eula Fried, FNP  levocetirizine (XYZAL) 5 MG tablet Take 1 tablet (5 mg total) by mouth every evening. 06/19/20   Flinchum, Eula Fried, FNP  predniSONE (STERAPRED UNI-PAK 21 TAB) 10 MG (21) TBPK tablet PO: Take 6 tablets on day 1:Take 5 tablets day 2:Take 4 tablets day 3: Take 3 tablets day 4:Take 2 tablets day five: 5 Take 1 tablet day 6 06/19/20   Flinchum, Eula Fried, FNP    Allergies Patient has no known allergies.  Family History  Problem Relation Age of Onset   Hypertension Mother    Hyperlipidemia Father    Heart attack Father    Diabetes Maternal Grandmother    COPD Maternal Grandmother    Congestive Heart Failure Maternal Grandmother    Cancer Maternal Grandfather    Alzheimer's disease Paternal Grandmother    Stomach cancer Paternal Grandfather     Social History Social History   Tobacco Use   Smoking status: Former    Packs/day: 1.00    Years: 5.00    Pack years: 5.00    Types: Cigarettes    Quit date: 03/01/2005    Years since quitting: 15.4   Smokeless tobacco: Current    Types: Chew  Substance Use Topics   Alcohol use: Yes  Alcohol/week: 2.0 standard drinks    Types: 2 Standard drinks or equivalent per week    Comment: OCCASIONALLY   Drug use: No     Review of Systems  Constitutional: No fever/chills Eyes: No visual changes. No discharge ENT: No upper respiratory complaints. Cardiovascular: Positive for chest tightness Respiratory: no cough. No SOB. Gastrointestinal: No abdominal pain.  No nausea, no vomiting.  No diarrhea.  No constipation. Musculoskeletal: Negative for musculoskeletal pain. Skin: Negative for rash, abrasions, lacerations, ecchymosis. Neurological: Negative for headaches, focal weakness  or numbness.  10 System ROS otherwise negative.  ____________________________________________   PHYSICAL EXAM:  VITAL SIGNS: ED Triage Vitals  Enc Vitals Group     BP 08/11/20 1250 (!) 153/74     Pulse Rate 08/11/20 1250 61     Resp 08/11/20 1250 20     Temp 08/11/20 1250 98.1 F (36.7 C)     Temp Source 08/11/20 1250 Oral     SpO2 08/11/20 1250 99 %     Weight 08/11/20 1251 215 lb (97.5 kg)     Height 08/11/20 1251 5\' 11"  (1.803 m)     Head Circumference --      Peak Flow --      Pain Score 08/11/20 1251 3     Pain Loc --      Pain Edu? --      Excl. in GC? --      Constitutional: Alert and oriented. Well appearing and in no acute distress. Eyes: Conjunctivae are normal. PERRL. EOMI. Head: Atraumatic. ENT:      Ears:       Nose: No congestion/rhinnorhea.      Mouth/Throat: Mucous membranes are moist.  Neck: No stridor.   Hematological/Lymphatic/Immunilogical: No cervical lymphadenopathy. Cardiovascular: Normal rate, regular rhythm. Normal S1 and S2.  Good peripheral circulation. Respiratory: Normal respiratory effort without tachypnea or retractions. Lungs CTAB. Good air entry to the bases with no decreased or absent breath sounds. Gastrointestinal: Bowel sounds 4 quadrants. Soft and nontender to palpation. No guarding or rigidity. No palpable masses. No distention. Musculoskeletal: Full range of motion to all extremities. No gross deformities appreciated. Neurologic:  Normal speech and language. No gross focal neurologic deficits are appreciated.  Skin:  Skin is warm, dry and intact. No rash noted. Psychiatric: Mood and affect are normal. Speech and behavior are normal. Patient exhibits appropriate insight and judgement.   ____________________________________________   LABS (all labs ordered are listed, but only abnormal results are displayed)  Labs Reviewed  BASIC METABOLIC PANEL - Abnormal; Notable for the following components:      Result Value   Glucose,  Bld 110 (*)    All other components within normal limits  CBC - Abnormal; Notable for the following components:   WBC 10.6 (*)    All other components within normal limits  TROPONIN I (HIGH SENSITIVITY)  TROPONIN I (HIGH SENSITIVITY)   ____________________________________________  EKG  ED ECG REPORT I, 10/11/20 Gianny Sabino,  personally viewed and interpreted this ECG.   Date: 08/11/2020  EKG Time: 1250 hrs.  Rate: 57 bpm  Rhythm: unchanged from previous tracings, sinus bradycardia, compared to previous EKG from 12/01/2017 relatively unchanged  Axis: Normal axis  Intervals:none  ST&T Change: No ST elevation or depression noted  Sinus bradycardia.  No STEMI.  Compared to previous EKG from 2019, patient still has sinus bradycardia and relatively unchanged  ____________________________________________  RADIOLOGY I personally viewed and evaluated these images as part of my medical decision making,  as well as reviewing the written report by the radiologist.  ED Provider Interpretation: No acute cardiopulmonary findings  DG Chest 2 View  Result Date: 08/11/2020 CLINICAL DATA:  Weakness.  Chest discomfort.  Ex-smoker. EXAM: CHEST - 2 VIEW COMPARISON:  12/22/2017 calcium score CT. FINDINGS: Midline trachea.  Normal heart size and mediastinal contours. Sharp costophrenic angles.  No pneumothorax.  Clear lungs. IMPRESSION: No active cardiopulmonary disease. Electronically Signed   By: Jeronimo GreavesKyle  Talbot M.D.   On: 08/11/2020 13:26    ____________________________________________    PROCEDURES  Procedure(s) performed:    Procedures    Medications - No data to display     Pulmonary Embolism Rule-out Criteria (PERC rule)                        If YES to ANY of the following, the PERC rule is not satisfied and cannot be used to rule out PE in this patient (consider d-dimer or imaging depending on pre-test probability).                      If NO to ALL of the following, AND the  clinician's pre-test probability is <15%, the [facility] rule is satisfied and there is no need for further workup (including no need to obtain a d-dimer) as the post-test probability of pulmonary embolism is <2%.                      Mnemonic is HAD CLOTS   H - hormone use (exogenous estrogen)      No. A - age > 50                                                 No. D - DVT/PE history                                      No.   C - coughing blood (hemoptysis)                 No. L - leg swelling, unilateral                             No. O - O2 Sat on Room Air < 95%                  No. T - tachycardia (HR ? 100)                         No. S - surgery or trauma, recent                      No.   Based on my evaluation of the patient, including application of this decision instrument, further testing to evaluate for pulmonary embolism is not indicated at this time. I have discussed this recommendation with the patient who states understanding and agreement with this plan.  ____________________________________________   INITIAL IMPRESSION / ASSESSMENT AND PLAN / ED COURSE  Pertinent labs & imaging results that were available during my care of the patient were reviewed by me and considered in my medical decision  making (see chart for details).  Review of the [place] CSRS was performed in accordance of the [facility] prior to dispensing any controlled drugs.           Patient's diagnosis is consistent with nonspecific chest pain.  Patient presented to emergency department complaining of chest tightness and a shaking sensation.  Patient states that symptoms began today.  He has had limited food intake today.  Overall exam is reassuring.  EKG, labs are reassuring at this time.  Troponin has remained within normal range for both draws.  Patient was in sinus bradycardia on EKG and has maintained in the mid 50s here in the emergency department.  This appears to be similar to the previous patient's only  previous EKG which also revealed sinus bradycardia at a rate of 56.  At this time feel patient is stable for discharge with no further work-up.  Differential included ACS/STEMI, PE, pneumonia, bronchitis, spontaneous pneumothorax.  Chest x-Gavia was reassuring.  Patient will follow up with cardiology for further evaluation of symptoms.  Return precautions discussed at length with the patient. Patient is given ED precautions to return to the ED for any worsening or new symptoms.     ____________________________________________  FINAL CLINICAL IMPRESSION(S) / ED DIAGNOSES  Final diagnoses:  Nonspecific chest pain      NEW MEDICATIONS STARTED DURING THIS VISIT:  ED Discharge Orders     None           This chart was dictated using voice recognition software/Dragon. Despite best efforts to proofread, errors can occur which can change the meaning. Any change was purely unintentional.    Racheal Patches, PA-C 08/11/20 1734    Sharman Cheek, MD 08/11/20 2359

## 2020-08-13 ENCOUNTER — Encounter: Payer: Self-pay | Admitting: Medical

## 2020-08-13 ENCOUNTER — Ambulatory Visit (INDEPENDENT_AMBULATORY_CARE_PROVIDER_SITE_OTHER): Payer: Commercial Managed Care - PPO | Admitting: Medical

## 2020-08-13 VITALS — BP 140/98 | HR 77 | Ht 71.0 in | Wt 213.4 lb

## 2020-08-13 DIAGNOSIS — I10 Essential (primary) hypertension: Secondary | ICD-10-CM | POA: Diagnosis not present

## 2020-08-13 DIAGNOSIS — R079 Chest pain, unspecified: Secondary | ICD-10-CM | POA: Diagnosis not present

## 2020-08-13 DIAGNOSIS — R42 Dizziness and giddiness: Secondary | ICD-10-CM

## 2020-08-13 DIAGNOSIS — R030 Elevated blood-pressure reading, without diagnosis of hypertension: Secondary | ICD-10-CM | POA: Diagnosis not present

## 2020-08-13 DIAGNOSIS — I309 Acute pericarditis, unspecified: Secondary | ICD-10-CM | POA: Diagnosis not present

## 2020-08-13 MED ORDER — IBUPROFEN 600 MG PO TABS: 600.0000 mg | ORAL_TABLET | Freq: Three times a day (TID) | ORAL | 0 refills | Status: DC

## 2020-08-13 MED ORDER — PANTOPRAZOLE SODIUM 40 MG PO TBEC: 40.0000 mg | DELAYED_RELEASE_TABLET | Freq: Every day | ORAL | 11 refills | Status: DC

## 2020-08-13 MED ORDER — COLCHICINE 0.6 MG PO TABS: 0.6000 mg | ORAL_TABLET | Freq: Every day | ORAL | 0 refills | Status: DC

## 2020-08-13 NOTE — Progress Notes (Signed)
Cardiology Office Note:    Date:  08/13/2020   ID:  Marthenia Rolling, DOB 14-Jun-1980, MRN [id number]  PCP:  Pcp, No  [facility] Cardiologist:  None  [facility] Electrophysiologist:  None   Referring MD: No ref. provider found   Chief Complaint: chest pain/ER follow-up  History of Present Illness:    NISHANTH MCCAUGHAN is a 40 y.o. male with a hx of obesity, dyslipidemia, strong family history of CAD (dad had LAD infarct 06/2017 and got a stent at [facility]).  Patient established on 11/2017 for cardiac evaluation.  No personal history of heart disease.  Reported 30 pound weight gain over 2 years.  Lipids showed elevated LDL.  Had a calcium score of 0.  Also underwent exercise stress test 12/22/2017 that was normal, no evidence of ischemia.  Reported prior echo for heart murmur was normal.  Last seen by Dr. Gala Romney 2019 and was doing well from a cardiac standpoint.  He reported snoring and was referred for sleep study.  Sleep study was normal.  More recently the patient presented to the ER 08/11/2020 for chest pain.  Blood pressure was mildly elevated.  EKG showed sinus bradycardia with no ischemic changes.  Chest x-Flippo unremarkable.  Troponin negative x2, he was recommended cardiology follow-up.  Today, he reports he is still having chest discomfort. Chest pain started on Sunday at church. Felt sob. Legs felt weak. Went home and was drinking water. Chest pain was worse. Felt someone was sitting on his chest. Couldn't take a deep breath,  pain worse with inspiration. BP was up. Not on BP meds. Chest pain has been intermittent since the hospital. Not worse on exertion. It feels like a constant pressures on the chest. Worse when he lays back. No tenderness on his chest. No LLE, orthopnea, pnd, fever, chills. 2 night ago woke up with sweats. No nausea, vomiting, Has some positional dizziness, which is new for the patient. Feels overall weaker, likely some dehydration  He had COVID a month ago, no  sxs. Hasn't been able to work for 2 weeks due to weakness. Job is moderately physical .  Took 4 baby Aspirin 81mg  daily with improvement of pain. Orthostatics negative.     Past Medical History:  Diagnosis Date   Chest pain    Dyslipidemia    Obesity     Past Surgical History:  Procedure Laterality Date   BACK SURGERY     Fusion of L5/S1    EYE SURGERY     LASIK Bilateral 09/20/2014    Current Medications: Current Meds  Medication Sig   aspirin 81 MG EC tablet Take 324 mg by mouth as needed. Swallow whole.   colchicine 0.6 MG tablet Take 1 tablet (0.6 mg total) by mouth daily.   ibuprofen (ADVIL) 600 MG tablet Take 1 tablet (600 mg total) by mouth 3 (three) times daily for 14 days.   pantoprazole (PROTONIX) 40 MG tablet Take 1 tablet (40 mg total) by mouth daily.   [DISCONTINUED] omeprazole (PRILOSEC) 20 MG capsule Take 20 mg by mouth daily.     Allergies:   Patient has no known allergies.   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, contracting  Tobacco Use   Smoking status: Former  Packs/day: 1.00    Years: 5.00    Pack years: 5.00    Types: Cigarettes    Quit date: 03/01/2005    Years since quitting: 15.4   Smokeless tobacco: Current    Types: Chew  Vaping Use   Vaping Use: Never used  Substance and Sexual Activity   Alcohol use: Yes    Alcohol/week: 2.0 standard drinks    Types: 2 Standard drinks or equivalent per week    Comment: OCCASIONALLY   Drug use: No   Sexual activity: Not on file  Other Topics Concern   Not on file  Social History Narrative   Not on file   Social Determinants of Health   Financial Resource Strain: Not on file  Food Insecurity: Not on file  Transportation Needs: Not on file  Physical Activity: Not on file  Stress:  Not on file  Social Connections: Not on file     Family History: The patient's family history includes Alzheimer's disease in his paternal grandmother; COPD in his maternal grandmother; Cancer in his maternal grandfather; Congestive Heart Failure in his maternal grandmother; Diabetes in his maternal grandmother; Heart attack (age of onset: 38) in his father; Hyperlipidemia in his father; Hypertension in his mother; Stomach cancer in his paternal grandfather.  ROS:   Please see the history of present illness.     All other systems reviewed and are negative.  EKGs/Labs/Other Studies Reviewed:    The following studies were reviewed today:  ETT 11/2017 Blood pressure demonstrated a normal response to exercise. There was no ST segment deviation noted during stress. The patient walked for 12:00 of a standard Bruce protocol. Peak HR of 181 which is 97% predicted maximal HR There were no ST changes to suggest ischemia. BP response was normal This is interpreted as a normal GXT . No evidence of ischemia.   Calcium score 11/2017 IMPRESSION: Coronary calcium score of 0. This was 0 percentile for age and sex matched control.  EKG:  EKG is ordered today.  The ekg ordered today demonstrates NSR, 77bpm, minimal diffuse STE, minimal PR depression  Recent Labs: 08/11/2020: BUN 16; Creatinine, Ser 1.23; Hemoglobin 15.4; Platelets 220; Potassium 3.6; Sodium 135  Recent Lipid Panel    Component Value Date/Time   CHOL 190 05/15/2019 0805   TRIG 153 (H) 05/15/2019 0805   HDL 43 05/15/2019 0805   CHOLHDL 4.4 05/15/2019 0805   CHOLHDL 4.4 12/01/2017 1156   VLDL 26 12/01/2017 1156   LDLCALC 120 (H) 05/15/2019 0805    Physical Exam:    VS:  BP (!) 140/98 (BP Location: Left Arm, Patient Position: Sitting, Cuff Size: Normal)   Pulse 77   Ht 5\' 11"  (1.803 m)   Wt 213 lb 6 oz (96.8 kg)   SpO2 98%   BMI 29.76 kg/m     Wt Readings from Last 3 Encounters:  08/13/20 213 lb 6 oz (96.8 kg)   08/11/20 215 lb (97.5 kg)  06/19/20 225 lb 9.6 oz (102.3 kg)     GEN:  Well nourished, well developed in no acute distress HEENT: Normal NECK: No JVD; No carotid bruits LYMPHATICS: No lymphadenopathy CARDIAC: RRR, no murmurs, rubs, gallops RESPIRATORY:  Clear to auscultation without rales, wheezing or rhonchi  ABDOMEN: Soft, non-tender, non-distended MUSCULOSKELETAL:  No edema; No deformity  SKIN: Warm and dry NEUROLOGIC:  Alert and oriented x 3 PSYCHIATRIC:  Normal affect   ASSESSMENT:    1. Acute pericarditis, unspecified type   2. Essential hypertension  3. Chest pain of uncertain etiology   4. Elevated blood pressure reading without diagnosis of hypertension   5. Dizziness    PLAN:    In order of problems listed above:  Chest pain Suspected Acute pericarditis Presents with chest pain with typical and atypical features. Patient had COVID 1 month ago and 2 weeks ago started experiencing left sided chest pain/pressure, pleuritic in nature with some change in positioning. Has some SOB, generalized weakness, and some chills. Not worse on exertion. Recent ER work-up showed Troponin negative x 2, CXR unremarkable, afebrile, with mild leukocytosis. Prior ischemic work-up in 2019 included calcium score of 0 and ETT that was normal. He took 4 baby aspirin this morning with mild improvement of symptoms. EKG today with minimal possible STE, diffuse with possible PR depression (also minimal). No rub on exam, BP mildly elevated, no JVD. I will check sed rate and CRP. Also will order echo, can be done as soon as possible. Low suspicion for ACS with negative troponin and prior ischemic evaluation. Given viral infection a month ago, improvement with aspirin, pleuritic chest pain, and some EKG changes I will treat for acute pericarditis with colchicine x 3 months, Ibuprofen 600mg BID x 2 weeks, and protonix. Might not need colchicine the full 3 months. We will see him back in 4-6 weeks to monitor  symptoms. If chest pain is unchanged can consider ETT.   Dizziness In the setting of the above. Recent labs unremarkable. Orthostatics negative today. Recommend good hydration.    Elevated BP Moderately elevated. Given recent COVID infection and possible acute pericarditis, can re-evaluate at follow-up.   Dyslipidemia LDL 120 in 05/2019. Can re-check at follow-up. Continue lifestyle changes.  Disposition: Follow up in 4-6 week(s) with MD/APP   Signed, Kosta Schnitzler 06/2019, PA-C  08/13/2020 3:44 PM    [facility]

## 2020-08-13 NOTE — Patient Instructions (Signed)
Medication Instructions:  STOP Prilosec  START  Protonix 40 mg Daily Ibuprofen 600 mg three times daily (for 2 weeks) Colchicine 0.6 mg Daily (for 3 months)  *If you need a refill on your cardiac medications before your next appointment, please call your pharmacy*   Lab Work: Sed rate & CRP LABS WILL APPEAR ON MYCHART, ABNORMAL RESULTS WILL BE CALLED  Testing/Procedures: ECHO (Friday 6/17 at 4pm)  Your physician has requested that you have an echocardiogram. Echocardiography is a painless test that uses sound waves to create images of your heart. It provides your doctor with information about the size and shape of your heart and how well your heart's chambers and valves are working. This procedure takes approximately one hour. There are no restrictions for this procedure.  There is a possibility that an IV may need to be started during your test to inject an image enhancing agent. This is done to obtain more optimal pictures of your heart. Therefore we ask that you do at least drink some water prior to coming in to hydrate your veins.     Follow-Up: At [facility], you and your health needs are our priority.  As part of our continuing mission to provide you with exceptional heart care, we have created designated Provider Care Teams.  These Care Teams include your primary Cardiologist (physician) and Advanced Practice Providers (APPs -  Physician Assistants and Nurse Practitioners) who all work together to provide you with the care you need, when you need it.  Your next appointment:   4 week(s)  The format for your next appointment:   In Person  Provider:   Julien Nordmann, MD or Cadence Fransico [place], [place]

## 2020-08-14 ENCOUNTER — Telehealth: Payer: Self-pay | Admitting: Medical

## 2020-08-14 LAB — C-REACTIVE PROTEIN: CRP: 1 mg/L (ref 0–10)

## 2020-08-14 LAB — SEDIMENTATION RATE: Sed Rate: 9 mm/hr (ref 0–15)

## 2020-08-14 NOTE — Telephone Encounter (Signed)
The patient was seen by Terrilee Croak, PA on 08/13/20.  To Cadence to review the message below.

## 2020-08-14 NOTE — Telephone Encounter (Signed)
Patient seen yesterday and calling to discuss if dx could be causing anxiety and weakness.    Patient states he was not able to go to work and wants to discuss .

## 2020-08-15 ENCOUNTER — Other Ambulatory Visit: Payer: Self-pay

## 2020-08-15 ENCOUNTER — Encounter (HOSPITAL_COMMUNITY): Payer: Self-pay | Admitting: Internal Medicine

## 2020-08-15 ENCOUNTER — Ambulatory Visit (HOSPITAL_COMMUNITY)
Admission: RE | Admit: 2020-08-15 | Discharge: 2020-08-15 | Disposition: A | Discharge status: Home / Self Care | Payer: Commercial Managed Care - PPO | Source: Ambulatory Visit | Attending: Internal Medicine | Admitting: Internal Medicine

## 2020-08-15 VITALS — BP 124/88 | HR 67 | Wt 216.2 lb

## 2020-08-15 DIAGNOSIS — E669 Obesity, unspecified: Secondary | ICD-10-CM | POA: Diagnosis not present

## 2020-08-15 DIAGNOSIS — F410 Panic disorder [episodic paroxysmal anxiety] without agoraphobia: Secondary | ICD-10-CM | POA: Insufficient documentation

## 2020-08-15 DIAGNOSIS — E781 Pure hyperglyceridemia: Secondary | ICD-10-CM | POA: Diagnosis not present

## 2020-08-15 DIAGNOSIS — F419 Anxiety disorder, unspecified: Secondary | ICD-10-CM

## 2020-08-15 DIAGNOSIS — R0789 Other chest pain: Secondary | ICD-10-CM

## 2020-08-15 DIAGNOSIS — Z8249 Family history of ischemic heart disease and other diseases of the circulatory system: Secondary | ICD-10-CM | POA: Diagnosis not present

## 2020-08-15 DIAGNOSIS — Z87891 Personal history of nicotine dependence: Secondary | ICD-10-CM | POA: Diagnosis not present

## 2020-08-15 MED ORDER — SERTRALINE HCL 50 MG PO TABS: 50.0000 mg | ORAL_TABLET | Freq: Every day | ORAL | 2 refills | Status: DC

## 2020-08-15 NOTE — Progress Notes (Signed)
Cardiology Clinic Note     Primary Care: Nechama Guard PA-C [facility])   HPI:  Eric Lane is a 40 y.o. male with h/o obesity, hypertriglyceridemia and strong family h/o CAD.   Established 11/2017 for cardiac evaluation. No personal history of heart disease. Reported 30 lbs weight gain over 2 years. Lipids at that visit with LDL elevated, but TGs greatly improved.   Dad had LAD infarct in 06/2017 and got stent at [facility]  Reports he had echo done several years ago for heart murmur and was normal.   Calcium score 12/22/17: Zero ETT 12/22/17: Normal, no evidence of ischemia.   Has been doing well. Working at [employer] Had back surgery in 2020. Going to gym occasionally. Had Covid a month ago.  Had a 20oz cup of coffee  and skipped breakfast. On Sunday was at [facility]. Stood up after the service. And felt acutely weak, presyncopal and SOB. Also had some chest pressure and hard to take a deep breath. Called EMS. Went to [facility]. CXR normal. ECG normal. Hs trop 3 & 4. ESR 9.   Saw Candace Furth PA-C on 6/14 still having chest tightness and feeling of anxiousness. Felt it might be pericarditis.  Started on ibuprofen, colchicine.  Returns today. Still feeling anxious and chest tightness. + palpitations. Unable to work due to anxiety and recurrent chest pressure. Chest pressure mostly associated with his anxiety but also gets worse with exertion.    Review of systems complete and found to be negative unless listed in HPI.    PMHx: 1. Obesity 2. Hypertriglyceridemia  Current Outpatient Medications  Medication Sig Dispense Refill   colchicine 0.6 MG tablet Take 1 tablet (0.6 mg total) by mouth daily. 90 tablet 0   ibuprofen (ADVIL) 600 MG tablet Take 1 tablet (600 mg total) by mouth 3 (three) times daily for 14 days. 43 tablet 0   pantoprazole (PROTONIX) 40 MG tablet Take 1 tablet (40 mg total) by mouth daily. 30 tablet 11   No current facility-administered medications for this  encounter.   No Known Allergies  Social History   Socioeconomic History   Marital status: Married    Spouse name: [name]   Number of children: 1   Years of education: 52   Highest education level: Not on file  Occupational History   Occupation: Chemical engineer: [employer]  Tobacco Use   Smoking status: Former    Packs/day: 1.00    Years: 5.00    Pack years: 5.00    Types: Cigarettes    Quit date: 03/01/2005    Years since quitting: 15.4   Smokeless tobacco: Current    Types: Chew  Vaping Use   Vaping Use: Never used  Substance and Sexual Activity   Alcohol use: Yes    Alcohol/week: 2.0 standard drinks    Types: 2 Standard drinks or equivalent per week    Comment: OCCASIONALLY   Drug use: No   Sexual activity: Not on file  Other Topics Concern   Not on file  Social History Narrative   Not on file   Social Determinants of Health   Financial Resource Strain: Not on file  Food Insecurity: Not on file  Transportation Needs: Not on file  Physical Activity: Not on file  Stress: Not on file  Social Connections: Not on file  Intimate Partner Violence: Not on file      Family History  Problem Relation Age of Onset   Hypertension Mother  Hyperlipidemia Father    Heart attack Father 52   Diabetes Maternal Grandmother    COPD Maternal Grandmother    Congestive Heart Failure Maternal Grandmother    Cancer Maternal Grandfather    Alzheimer's disease Paternal Grandmother    Stomach cancer Paternal Grandfather    Father with MI at age 60.   Vitals:   08/15/20 1349  BP: 124/88  Pulse: 67  SpO2: 100%  Weight: 98.1 kg (216 lb 3.2 oz)   Wt Readings from Last 3 Encounters:  08/15/20 98.1 kg (216 lb 3.2 oz)  08/13/20 96.8 kg (213 lb 6 oz)  08/11/20 97.5 kg (215 lb)    PHYSICAL EXAM: General:  Well appearing. No resp difficulty HEENT: normal Neck: supple. no JVD. Carotids 2+ bilat; no bruits. No lymphadenopathy or thryomegaly  appreciated. Cor: PMI nondisplaced. Regular rate & rhythm. No rubs, gallops or murmurs. Lungs: clear Abdomen: soft, nontender, nondistended. No hepatosplenomegaly. No bruits or masses. Good bowel sounds. Extremities: no cyanosis, clubbing, rash, edema Neuro: alert & orientedx3, cranial nerves grossly intact. moves all 4 extremities w/o difficulty. Affect pleasant. Mildly anxious. Wringing hands   ASSESSMENT & PLAN:  Chest pressure - suspect mostly related to anxiety but has an exertional component as well. Given very strong Fhx will proceed with cardiac CTA to further evaluate  2. Anxiety/panic attacks - Start Zoloft 50 - F/u PCP   3. Hypertriglyceridemia - Followed by PCP  Glori Bickers, MD  2:18 PM

## 2020-08-15 NOTE — Patient Instructions (Addendum)
No Labs done today.   STOP taking Protonix  STOP taking Ibuprofen  STOP taking Colchicine  START taking Zoloft 50mg  (1 tablet) by mouth daily.  No other medication changes were made. Please continue all current medications as prescribed.  Non-Cardiac CT Angiography (CTA), is a special type of CT scan that uses a computer to produce multi-dimensional views of major blood vessels throughout the body. In CT angiography, a contrast material is injected through an IV to help visualize the blood vessels. This has to be approved through your insurance company prior to scheduling once approved we will contact you to schedule an appointment.   Your physician recommends that you schedule a follow-up appointment in: 6 months. Please contact our office in November to schedule a December appointment.  If you have any questions or concerns before your next appointment please send January a message through [place] or call our office at [phone].    TO LEAVE A MESSAGE FOR THE NURSE SELECT OPTION 2, PLEASE LEAVE A MESSAGE INCLUDING: YOUR NAME DATE OF BIRTH CALL BACK NUMBER REASON FOR CALL**this is important as we prioritize the call backs  YOU WILL RECEIVE A CALL BACK THE SAME DAY AS LONG AS YOU CALL BEFORE 4:00 PM   Do the following things EVERYDAY: Weigh yourself in the morning before breakfast. Write it down and keep it in a log. Take your medicines as prescribed Eat low salt foods--Limit salt (sodium) to 2000 mg per day.  Stay as active as you can everyday Limit all fluids for the day to less than 2 liters   At [facility], you and your health needs are our priority. As part of our continuing mission to provide you with exceptional heart care, we have created designated Provider Care Teams. These Care Teams include your primary Cardiologist (physician) and Advanced Practice Providers (APPs- Physician Assistants and Nurse Practitioners) who all work together to provide you  with the care you need, when you need it.   You may see any of the following providers on your designated Care Team at your next follow up: Dr [phone] Dr Arvilla Meres, NP Carron Curie, Robbie Lis [place], PharmD   Please be sure to bring in all your medications bottles to every appointment.

## 2020-08-16 ENCOUNTER — Ambulatory Visit (INDEPENDENT_AMBULATORY_CARE_PROVIDER_SITE_OTHER): Payer: Commercial Managed Care - PPO

## 2020-08-16 ENCOUNTER — Ambulatory Visit: Payer: Commercial Managed Care - PPO | Admitting: Family Medicine

## 2020-08-16 DIAGNOSIS — I309 Acute pericarditis, unspecified: Secondary | ICD-10-CM | POA: Diagnosis not present

## 2020-08-16 NOTE — Telephone Encounter (Signed)
It is certainly reasonable to stay out of work w/ a diagnosis of pericarditis w/ ongoing pleuritic chest pain.  Symptoms generally improve within days of being placed on nsaids/colchicine, but reasonable to stay out a week or more if necessary.  I see that his echo is scheduled for today.

## 2020-08-16 NOTE — Telephone Encounter (Signed)
Spoke with patient and reviewed provider recommendations. He verbalized understanding with no further questions at this time.  

## 2020-08-17 LAB — ECHOCARDIOGRAM COMPLETE
AR max vel: 3.44 cm2
AV Area VTI: 3.32 cm2
AV Area mean vel: 3.11 cm2
AV Mean grad: 7 mmHg
AV Peak grad: 12 mmHg
Ao pk vel: 1.73 m/s
Area-P 1/2: 6.17 cm2
Calc EF: 60.3 %
S' Lateral: 2.5 cm
Single Plane A2C EF: 57.9 %
Single Plane A4C EF: 61.9 %

## 2020-08-20 ENCOUNTER — Telehealth: Payer: Self-pay | Admitting: *Deleted

## 2020-08-20 ENCOUNTER — Encounter (HOSPITAL_COMMUNITY): Payer: Self-pay

## 2020-08-20 NOTE — Telephone Encounter (Signed)
Pt notified of echo results below.  Pt asks if he should continue Pericarditis protocol.   Last ov with Cadence [place], [place] 08/13/20: I will treat for acute pericarditis with colchicine x 3 months, Ibuprofen 600mg BID x 2 weeks, and protonix. Might not need colchicine the full 3 months. We will see him back in 4-6 weeks to monitor symptoms. If chest pain is unchanged can consider ETT.   Notified pt will clarify and return call.

## 2020-08-20 NOTE — Telephone Encounter (Signed)
-----   Message from Cadence David Stall, PA-C sent at 08/18/2020  8:39 PM EDT ----- Echo showed normal pump function, normal relaxation, mild leaky mitral valve, no fluid around the heart. Overall very reassuring.

## 2020-08-20 NOTE — Telephone Encounter (Signed)
Reviewed that he should continue with plan and confirmed upcoming appointment. He verbalized understanding of our conversation with no further questions at this time.

## 2020-08-22 ENCOUNTER — Other Ambulatory Visit: Payer: Self-pay

## 2020-08-22 ENCOUNTER — Other Ambulatory Visit: Payer: Commercial Managed Care - PPO

## 2020-08-22 ENCOUNTER — Other Ambulatory Visit: Payer: Self-pay | Admitting: *Deleted

## 2020-08-22 DIAGNOSIS — Z9852 Vasectomy status: Secondary | ICD-10-CM

## 2020-08-23 LAB — POST-VAS SPERM EVALUATION,QUAL: Volume: 1.7 mL

## 2020-08-26 ENCOUNTER — Telehealth: Payer: Self-pay

## 2020-08-26 DIAGNOSIS — Z9852 Vasectomy status: Secondary | ICD-10-CM

## 2020-08-26 NOTE — Telephone Encounter (Signed)
Pt aware. Appt made for one month. Pt verbalized understanding.

## 2020-08-26 NOTE — Telephone Encounter (Signed)
-----   Message from Vanna Scotland, MD sent at 08/24/2020  8:51 PM EDT ----- There is still sperm.  This doesn't necessarily mean failure but still not safe to rely on this as birth control.  Please repeat in one month, ejaculate 10 more times.  Vanna Scotland, MD

## 2020-08-27 ENCOUNTER — Telehealth (HOSPITAL_COMMUNITY): Payer: Self-pay | Admitting: *Deleted

## 2020-08-27 NOTE — Telephone Encounter (Signed)
Per pts insurance police termed in April.

## 2020-09-16 ENCOUNTER — Other Ambulatory Visit (HOSPITAL_COMMUNITY): Payer: Self-pay | Admitting: *Deleted

## 2020-09-16 MED ORDER — SERTRALINE HCL 50 MG PO TABS
50.0000 mg | ORAL_TABLET | Freq: Every day | ORAL | 2 refills | Status: DC
Start: 2020-09-16 — End: 2021-04-23

## 2020-09-17 ENCOUNTER — Ambulatory Visit: Payer: Commercial Managed Care - PPO | Admitting: Cardiovascular Disease

## 2020-09-26 ENCOUNTER — Other Ambulatory Visit: Payer: Commercial Managed Care - PPO

## 2020-09-26 ENCOUNTER — Other Ambulatory Visit: Payer: Self-pay

## 2020-09-26 DIAGNOSIS — Z9852 Vasectomy status: Secondary | ICD-10-CM

## 2020-09-27 LAB — POST-VAS SPERM EVALUATION,QUAL: Volume: 0.5 mL

## 2020-09-30 ENCOUNTER — Encounter: Payer: Self-pay | Admitting: Urology

## 2020-09-30 DIAGNOSIS — Z9852 Vasectomy status: Secondary | ICD-10-CM

## 2020-09-30 NOTE — Telephone Encounter (Addendum)
Patient informed, would prefer a repeat sample. Schedule lab appointment.   ----- Message from Vanna Scotland, MD sent at 09/30/2020 11:34 AM EDT ----- No sperm are seen in the specimen although it is a pretty low volume.  If you are certain that this was a full ejaculate collection, it can now be used as reliable birth control.  If there is question about whether or not you caught all of the semen, we can repeat it 1 more time to be sure.  Vanna Scotland, MD

## 2020-10-18 ENCOUNTER — Telehealth (HOSPITAL_COMMUNITY): Payer: Self-pay | Admitting: *Deleted

## 2020-10-18 DIAGNOSIS — R079 Chest pain, unspecified: Secondary | ICD-10-CM

## 2020-10-18 NOTE — Telephone Encounter (Signed)
Cardiac CT  auth faxed to [facility]. Auth in clinical review

## 2020-10-28 NOTE — Telephone Encounter (Signed)
Auth # [id number] exp. 11/23/20

## 2020-10-29 NOTE — Addendum Note (Signed)
Addended by: Noralee Space on: 10/29/2020 12:55 PM   Modules accepted: Orders

## 2020-10-31 ENCOUNTER — Other Ambulatory Visit: Payer: Self-pay

## 2020-10-31 ENCOUNTER — Other Ambulatory Visit: Payer: Commercial Managed Care - PPO

## 2020-10-31 DIAGNOSIS — Z9852 Vasectomy status: Secondary | ICD-10-CM

## 2020-11-01 ENCOUNTER — Other Ambulatory Visit: Payer: Commercial Managed Care - PPO

## 2020-11-01 ENCOUNTER — Ambulatory Visit: Payer: Self-pay | Admitting: Urology

## 2020-11-01 LAB — POST-VAS SPERM EVALUATION,QUAL: Volume: 2.3 mL

## 2020-11-07 ENCOUNTER — Other Ambulatory Visit (HOSPITAL_COMMUNITY): Payer: Self-pay | Admitting: Emergency Medicine

## 2020-11-07 MED ORDER — METOPROLOL TARTRATE 50 MG PO TABS: 50.0000 mg | ORAL_TABLET | Freq: Once | ORAL | 0 refills | Status: AC

## 2020-11-07 NOTE — Progress Notes (Signed)
One time dose 50mg  metoprolol tartrate sent to pharm on file. For CCTA HR control  RN Navigator Cardiac Imaging [facility] [phone] Office  [phone] Cell

## 2020-11-08 ENCOUNTER — Ambulatory Visit (HOSPITAL_COMMUNITY): Payer: Commercial Managed Care - PPO

## 2020-11-08 ENCOUNTER — Telehealth (HOSPITAL_COMMUNITY): Payer: Self-pay | Admitting: Emergency Medicine

## 2020-11-08 NOTE — Telephone Encounter (Signed)
Reaching out to patient to offer assistance regarding upcoming cardiac imaging study; pt verbalizes understanding of appt date/time, parking situation and where to check in, pre-test NPO status and medications ordered, and verified current allergies; name and call back number provided for further questions should they arise Rockwell Alexandria RN Navigator Cardiac Imaging [facility] [phone] office [phone] cell  Pt takes 25mg  metoprolol succ qd and reports resting HR in the 60s  Denies iv issue Denies claustro

## 2020-11-12 ENCOUNTER — Encounter: Payer: Self-pay | Admitting: *Deleted

## 2020-11-12 ENCOUNTER — Other Ambulatory Visit: Payer: Self-pay

## 2020-11-12 ENCOUNTER — Ambulatory Visit (HOSPITAL_COMMUNITY)
Admission: RE | Admit: 2020-11-12 | Discharge: 2020-11-12 | Disposition: A | Discharge status: Home / Self Care | Payer: Commercial Managed Care - PPO | Source: Ambulatory Visit | Attending: Internal Medicine | Admitting: Internal Medicine

## 2020-11-12 DIAGNOSIS — R079 Chest pain, unspecified: Secondary | ICD-10-CM | POA: Insufficient documentation

## 2020-11-12 DIAGNOSIS — Z006 Encounter for examination for normal comparison and control in clinical research program: Secondary | ICD-10-CM

## 2020-11-12 MED ORDER — NITROGLYCERIN 0.4 MG SL SUBL
0.8000 mg | SUBLINGUAL_TABLET | Freq: Once | SUBLINGUAL | Status: AC
  Administered 2020-11-12: 0.8 mg via SUBLINGUAL

## 2020-11-12 MED ORDER — IOHEXOL 350 MG/ML SOLN
95.0000 mL | Freq: Once | INTRAVENOUS | Status: AC | PRN
  Administered 2020-11-12: 95 mL via INTRAVENOUS

## 2020-11-12 MED ORDER — NITROGLYCERIN 0.4 MG SL SUBL
SUBLINGUAL_TABLET | SUBLINGUAL | Status: AC
  Filled 2020-11-12: qty 2

## 2020-11-12 NOTE — Research (Signed)
IDENTIFY G4 Informed Consent   Subject Name: Eric Lane  Subject met inclusion and exclusion criteria.  The informed consent form, study requirements and expectations were reviewed with the subject and questions and concerns were addressed prior to the signing of the consent form.  The subject verbalized understanding of the trial requirements.  The subject agreed to participate in the [facility] trial and signed the informed consent at 0748 on 11/12/2020.  The informed consent was obtained prior to performance of any protocol-specific procedures for the subject.  A copy of the signed informed consent was given to the subject and a copy was placed in the subject's medical record.   Philemon Kingdom D

## 2020-11-18 IMAGING — RF DG LUMBAR SPINE 2-3V
1 series · 2 of 2 positions shown · non-contrast
Comparison: Plain films lumbar spine 02/19/2017.

CLINICAL DATA: Intraoperative imaging for L5-S1 fusion.

EXAM:
LUMBAR SPINE - 2-3 VIEW; DG C-ARM 1-60 MIN

[Series 1: run · 2 of 2 slices shown]
[im 1/2]
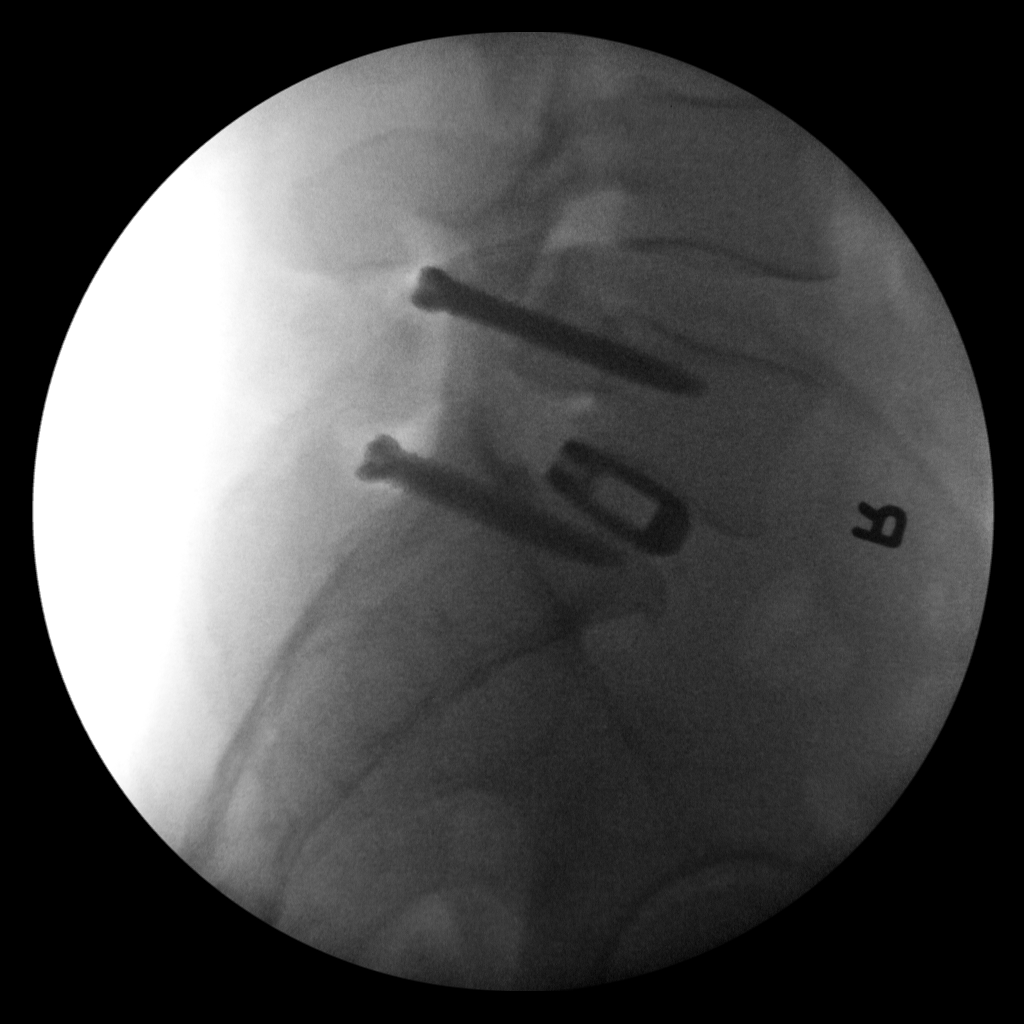
[im 2/2]
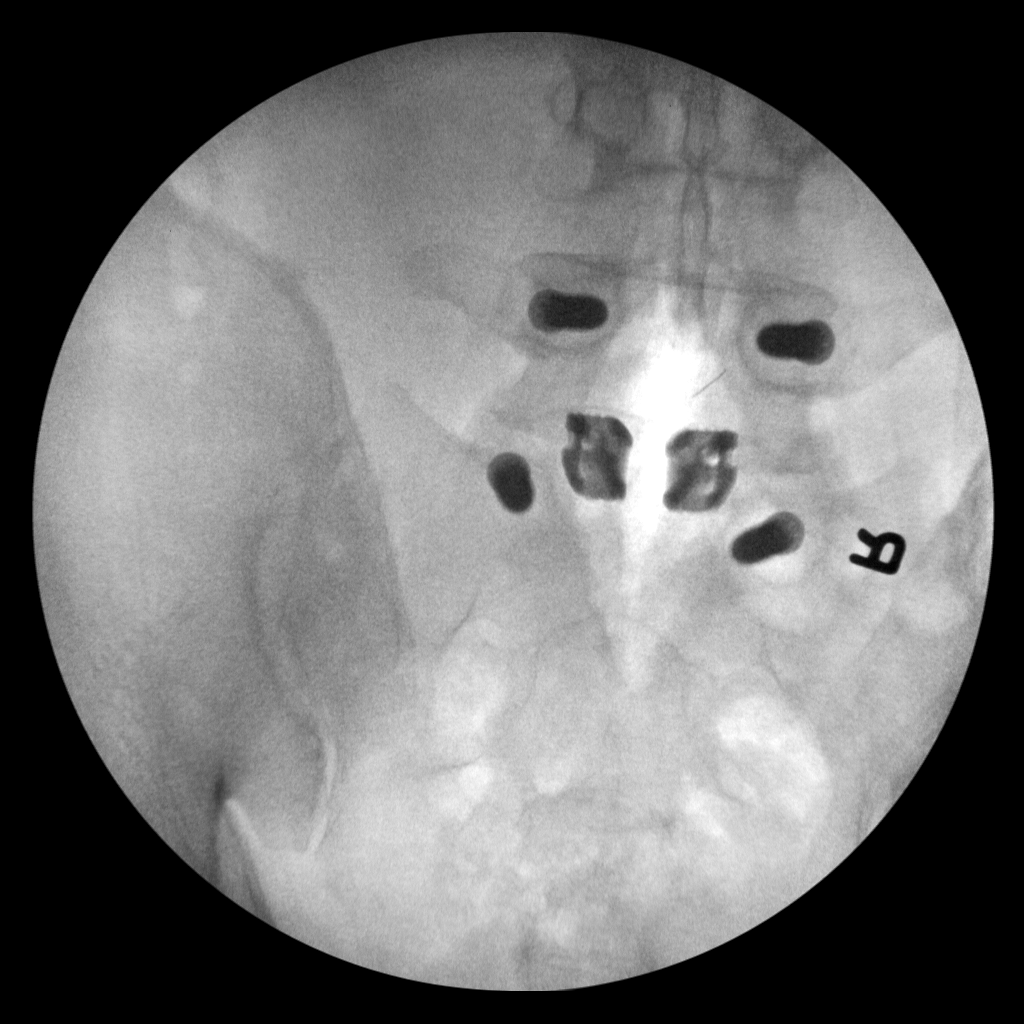

[2 of 2 positions shown; findings below may reference images not displayed]

FINDINGS: Two fluoroscopic spot views of the lower lumbar spine demonstrate
pedicle screws and interbody spacer in place at L5-S1. Bilateral L5
pars interarticularis defects are noted. There is grade 1
anterolisthesis L5 on S1.
IMPRESSION: Intraoperative imaging for L5-S1 PLIF.

## 2020-12-16 ENCOUNTER — Other Ambulatory Visit: Payer: Self-pay

## 2020-12-16 ENCOUNTER — Ambulatory Visit
Admission: RE | Admit: 2020-12-16 | Discharge: 2020-12-16 | Disposition: A | Discharge status: Home / Self Care | Payer: Commercial Managed Care - PPO | Source: Ambulatory Visit

## 2020-12-16 VITALS — BP 123/81 | HR 70 | Temp 97.8°F | Resp 18

## 2020-12-16 DIAGNOSIS — J0100 Acute maxillary sinusitis, unspecified: Secondary | ICD-10-CM | POA: Diagnosis not present

## 2020-12-16 MED ORDER — DOXYCYCLINE HYCLATE 100 MG PO CAPS: 100.0000 mg | ORAL_CAPSULE | Freq: Two times a day (BID) | ORAL | 0 refills | Status: DC

## 2020-12-16 MED ORDER — BENZONATATE 100 MG PO CAPS: 100.0000 mg | ORAL_CAPSULE | Freq: Three times a day (TID) | ORAL | 0 refills | Status: DC | PRN

## 2020-12-16 NOTE — ED Triage Notes (Signed)
Pt c/o cough, congestion, ear pain, ST, and drainage x 2 weeks. Pt had a telehealth visit and was prescribed 10 days of amoxicillin he completed treatment on 12/12/20. His sxs returned 2 days later.

## 2020-12-16 NOTE — Discharge Instructions (Addendum)
Take the doxycycline as directed.  Take the [facility] as needed for cough.  Follow-up with your primary care provider or an ENT if your symptoms are not improving.

## 2020-12-16 NOTE — ED Provider Notes (Signed)
Eric Lane    CSN: [id number] Arrival date & time: 12/16/20  1012      History   Chief Complaint Chief Complaint  Patient presents with   URI    HPI Eric Lane is a 40 y.o. male.  Patient presents with 2-week history of sinus congestion, postnasal drip, ear pain, sore throat, cough.  He just completed a 10-day course of amoxicillin which was prescribed on an e-visit.  His symptoms improved but then returned 2 days after completing the antibiotic.  He denies fever, chills, shortness of breath, or other symptoms.  His medical history includes hypertension, seasonal allergies, GERD.  The history is provided by the patient and medical records.   Past Medical History:  Diagnosis Date   Chest pain    Dyslipidemia    Obesity     Patient Active Problem List   Diagnosis Date Noted   Non-recurrent acute serous otitis media of right ear 06/19/2020   Eustachian tube disorder, bilateral 06/19/2020   Seasonal allergies 06/19/2020   Spondylolisthesis at L5-S1 level 11/14/2018   Essential hypertension 03/30/2018   Elevated blood sugar 08/15/2014   GERD (gastroesophageal reflux disease) 08/15/2014   External hemorrhoid 08/15/2014   Hypercholesteremia 08/15/2014   Hypotestosteronism 08/15/2014   Cardiac murmur 08/15/2014   Awareness of heartbeats 08/15/2014   Avitaminosis D 08/15/2014    Past Surgical History:  Procedure Laterality Date   BACK SURGERY     Fusion of L5/S1    EYE SURGERY     LASIK Bilateral 09/20/2014       Home Medications    Prior to Admission medications   Medication Sig Start Date End Date Taking? Authorizing Provider  benzonatate (TESSALON) 100 MG capsule Take 1 capsule (100 mg total) by mouth 3 (three) times daily as needed for cough. 12/16/20  Yes Mickie Bail, NP  doxycycline (VIBRAMYCIN) 100 MG capsule Take 1 capsule (100 mg total) by mouth 2 (two) times daily. 12/16/20  Yes Mickie Bail, NP  metoprolol tartrate (LOPRESSOR) 50 MG  tablet Take 1 tablet (50 mg total) by mouth once for 1 dose. Take 2 hr prior to CT scan 11/07/20 12/16/20 Yes Bensimhon, Bevelyn Buckles, MD  famotidine (PEPCID) 40 MG tablet Take 40 mg by mouth daily. 11/26/20   [provider]  sertraline (ZOLOFT) 50 MG tablet Take 1 tablet (50 mg total) by mouth daily. 09/16/20 09/16/21  Bensimhon, Bevelyn Buckles, MD    Family History Family History  Problem Relation Age of Onset   Hypertension Mother    Hyperlipidemia Father    Heart attack Father 42   Diabetes Maternal Grandmother    COPD Maternal Grandmother    Congestive Heart Failure Maternal Grandmother    Cancer Maternal Grandfather    Alzheimer's disease Paternal Grandmother    Stomach cancer Paternal Grandfather     Social History Social History   Tobacco Use   Smoking status: Former    Packs/day: 1.00    Years: 5.00    Pack years: 5.00    Types: Cigarettes    Quit date: 03/01/2005    Years since quitting: 15.8   Smokeless tobacco: Current    Types: Chew  Vaping Use   Vaping Use: Never used  Substance Use Topics   Alcohol use: Yes    Alcohol/week: 2.0 standard drinks    Types: 2 Standard drinks or equivalent per week    Comment: OCCASIONALLY   Drug use: No     Allergies  Patient has no known allergies.   Review of Systems Review of Systems  Constitutional:  Negative for chills and fever.  HENT:  Positive for congestion, ear pain, postnasal drip, sinus pressure and sore throat.   Respiratory:  Positive for cough. Negative for shortness of breath.   Cardiovascular:  Negative for chest pain and palpitations.  Gastrointestinal:  Negative for abdominal pain and vomiting.  Skin:  Negative for color change and rash.  All other systems reviewed and are negative.   Physical Exam Triage Vital Signs ED Triage Vitals  Enc Vitals Group     BP      Pulse      Resp      Temp      Temp src      SpO2      Weight      Height      Head Circumference      Peak Flow      Pain  Score      Pain Loc      Pain Edu?      Excl. in GC?    No data found.  Updated Vital Signs BP 123/81 (BP Location: Left Arm)   Pulse 70   Temp 97.8 F (36.6 C) (Oral)   Resp 18   SpO2 95%   Visual Acuity Right Eye Distance:   Left Eye Distance:   Bilateral Distance:    Right Eye Near:   Left Eye Near:    Bilateral Near:     Physical Exam Vitals and nursing note reviewed.  Constitutional:      General: He is not in acute distress.    Appearance: He is well-developed.  HENT:     Head: Normocephalic and atraumatic.     Right Ear: Tympanic membrane normal.     Left Ear: Tympanic membrane normal.     Nose: Nose normal.     Mouth/Throat:     Mouth: Mucous membranes are moist.     Pharynx: Oropharynx is clear.  Eyes:     Conjunctiva/sclera: Conjunctivae normal.  Cardiovascular:     Rate and Rhythm: Normal rate and regular rhythm.     Heart sounds: Normal heart sounds.  Pulmonary:     Effort: Pulmonary effort is normal. No respiratory distress.     Breath sounds: Normal breath sounds.  Abdominal:     Palpations: Abdomen is soft.     Tenderness: There is no abdominal tenderness.  Musculoskeletal:     Cervical back: Neck supple.  Skin:    General: Skin is warm and dry.  Neurological:     General: No focal deficit present.     Mental Status: He is alert and oriented to person, place, and time.     Gait: Gait normal.  Psychiatric:        Mood and Affect: Mood normal.        Behavior: Behavior normal.     UC Treatments / Results  Labs (all labs ordered are listed, but only abnormal results are displayed) Labs Reviewed - No data to display  EKG   Radiology No results found.  Procedures Procedures (including critical care time)  Medications Ordered in UC Medications - No data to display  Initial Impression / Assessment and Plan / UC Course  I have reviewed the triage vital signs and the nursing notes.  Pertinent labs & imaging results that were  available during my care of the patient were reviewed by me and considered in  my medical decision making (see chart for details).   Acute sinusitis.  Patient just completed a 10-day course of amoxicillin last week.  Treating today with doxycycline and Tessalon Perles.  Education provided on sinusitis.  Instructed patient to follow-up with his PCP or an ENT if his symptoms are not improving.  He agrees to plan of care.   Final Clinical Impressions(s) / UC Diagnoses   Final diagnoses:  Acute non-recurrent maxillary sinusitis     Discharge Instructions      Take the doxycycline as directed.  Take the [facility] as needed for cough.  Follow-up with your primary care provider or an ENT if your symptoms are not improving.        ED Prescriptions     Medication Sig Dispense Auth. Provider   doxycycline (VIBRAMYCIN) 100 MG capsule Take 1 capsule (100 mg total) by mouth 2 (two) times daily. 20 capsule Mickie Bail, NP   benzonatate (TESSALON) 100 MG capsule Take 1 capsule (100 mg total) by mouth 3 (three) times daily as needed for cough. 21 capsule Mickie Bail, NP      PDMP not reviewed this encounter.   Mickie Bail, NP 12/16/20 1040

## 2021-01-14 ENCOUNTER — Ambulatory Visit
Admission: EM | Admit: 2021-01-14 | Discharge: 2021-01-14 | Disposition: A | Discharge status: Home / Self Care | Payer: Commercial Managed Care - PPO | Attending: Emergency Medicine | Admitting: Emergency Medicine

## 2021-01-14 ENCOUNTER — Encounter: Payer: Self-pay | Admitting: Emergency Medicine

## 2021-01-14 ENCOUNTER — Ambulatory Visit: Payer: Self-pay

## 2021-01-14 DIAGNOSIS — R059 Cough, unspecified: Secondary | ICD-10-CM | POA: Diagnosis not present

## 2021-01-14 DIAGNOSIS — B349 Viral infection, unspecified: Secondary | ICD-10-CM | POA: Diagnosis not present

## 2021-01-14 LAB — POCT INFLUENZA A/B
Influenza A, POC: NEGATIVE
Influenza B, POC: NEGATIVE

## 2021-01-14 NOTE — Telephone Encounter (Signed)
Pt. Instructed to call his PCP for an appointment. Verbalizes understanding. Has established care with Dr. Nemiah Commander.  Reason for Disposition  [1] Continuous (nonstop) coughing interferes with work or school AND [2] no improvement using cough treatment per Care Advice  Answer Assessment - Initial Assessment Questions 1. ONSET: "When did the cough begin?"      2 days ago 2. SEVERITY: "How bad is the cough today?"      Severe 3. SPUTUM: "Describe the color of your sputum" (none, dry cough; clear, white, yellow, green)     Yellow 4. HEMOPTYSIS: "Are you coughing up any blood?" If so ask: "How much?" (flecks, streaks, tablespoons, etc.)     No 5. DIFFICULTY BREATHING: "Are you having difficulty breathing?" If Yes, ask: "How bad is it?" (e.g., mild, moderate, severe)    - MILD: No SOB at rest, mild SOB with walking, speaks normally in sentences, can lie down, no retractions, pulse < 100.    - MODERATE: SOB at rest, SOB with minimal exertion and prefers to sit, cannot lie down flat, speaks in phrases, mild retractions, audible wheezing, pulse 100-120.    - SEVERE: Very SOB at rest, speaks in single words, struggling to breathe, sitting hunched forward, retractions, pulse > 120      No 6. FEVER: "Do you have a fever?" If Yes, ask: "What is your temperature, how was it measured, and when did it start?"     101 7. CARDIAC HISTORY: "Do you have any history of heart disease?" (e.g., heart attack, congestive heart failure)      Palpitations 8. LUNG HISTORY: "Do you have any history of lung disease?"  (e.g., pulmonary embolus, asthma, emphysema)     No 9. PE RISK FACTORS: "Do you have a history of blood clots?" (or: recent major surgery, recent prolonged travel, bedridden)     No 10. OTHER SYMPTOMS: "Do you have any other symptoms?" (e.g., runny nose, wheezing, chest pain)       Cough, body aches 11. PREGNANCY: "Is there any chance you are pregnant?" "When was your last menstrual period?"        N/a 12. TRAVEL: "Have you traveled out of the country in the last month?" (e.g., travel history, exposures)       No  Protocols used: Cough - Acute Productive-A-AH

## 2021-01-14 NOTE — ED Triage Notes (Signed)
Pt here with nagging cough x 3 months, but more recently on Sunday, has had fever, body aches, congestion and worsening cough.

## 2021-01-14 NOTE — ED Provider Notes (Signed)
[facility]    CSN: [id number] Arrival date & time: 01/14/21  0847      History   Chief Complaint Chief Complaint  Patient presents with   Cough   Nasal Congestion   Generalized Body Aches   Fever    HPI Eric Lane is a 40 y.o. male.  Patient presents with 2-day history of fever, body aches, congestion, worsening cough.  T-max 101.  He has had an intermittent cough for several months.  Treatment at home with Tylenol and Mucinex.  He denies rash, shortness of breath, vomiting, diarrhea, or other symptoms.  Last month patient was treated with amoxicillin and doxycycline for sinusitis.    The history is provided by the patient and medical records. The history is provided by the patient and medical records. The history is provided by the patient and medical records. The history is provided by the patient and medical records. The history is provided by the patient and medical records.  FeverThe history is provided by the patient.  Cough Associated symptoms: fever   Fever Associated symptoms: cough    Past Medical History:  Diagnosis Date   Chest pain    Dyslipidemia    Obesity     Patient Active Problem List   Diagnosis Date Noted   Non-recurrent acute serous otitis media of right ear 06/19/2020   Eustachian tube disorder, bilateral 06/19/2020   Seasonal allergies 06/19/2020   Spondylolisthesis at L5-S1 level 11/14/2018   Essential hypertension 03/30/2018   Elevated blood sugar 08/15/2014   GERD (gastroesophageal reflux disease) 08/15/2014   External hemorrhoid 08/15/2014   Hypercholesteremia 08/15/2014   Hypotestosteronism 08/15/2014   Cardiac murmur 08/15/2014   Awareness of heartbeats 08/15/2014   Avitaminosis D 08/15/2014    Past Surgical History:  Procedure Laterality Date   BACK SURGERY     Fusion of L5/S1    EYE SURGERY     LASIK Bilateral 09/20/2014       Home Medications    Prior to Admission medications   Medication Sig Start Date  End Date Taking? Authorizing Provider  benzonatate (TESSALON) 100 MG capsule Take 1 capsule (100 mg total) by mouth 3 (three) times daily as needed for cough. 12/16/20   Mickie Bailate, Teancum Brule H, NP  doxycycline (VIBRAMYCIN) 100 MG capsule Take 1 capsule (100 mg total) by mouth 2 (two) times daily. 12/16/20   Mickie Bailate, Kreig Parson H, NP  famotidine (PEPCID) 40 MG tablet Take 40 mg by mouth daily. 11/26/20   [provider]  metoprolol tartrate (LOPRESSOR) 50 MG tablet Take 1 tablet (50 mg total) by mouth once for 1 dose. Take 2 hr prior to CT scan 11/07/20 12/16/20  Bensimhon, Bevelyn Bucklesaniel R, MD  sertraline (ZOLOFT) 50 MG tablet Take 1 tablet (50 mg total) by mouth daily. 09/16/20 09/16/21  Bensimhon, Bevelyn Bucklesaniel R, MD    Family History Family History  Problem Relation Age of Onset   Hypertension Mother    Hyperlipidemia Father    Heart attack Father [id number]   Diabetes Maternal Grandmother    COPD Maternal Grandmother    Congestive Heart Failure Maternal Grandmother    Cancer Maternal Grandfather    Alzheimer's disease Paternal Grandmother    Stomach cancer Paternal Grandfather     Social History Social History   Tobacco Use   Smoking status: Former    Packs/day: 1.00    Years: 5.00    Pack years: 5.00    Types: Cigarettes    Quit date: 03/01/2005  Years since quitting: 15.8   Smokeless tobacco: Current    Types: Chew  Vaping Use   Vaping Use: Never used  Substance Use Topics   Alcohol use: Yes    Alcohol/week: 2.0 standard drinks    Types: 2 Standard drinks or equivalent per week    Comment: OCCASIONALLY   Drug use: No     Allergies   Patient has no known allergies.   Review of Systems Review of Systems  Constitutional:  Positive for fever. Negative for chills.  HENT:  Positive for congestion, postnasal drip and rhinorrhea. Negative for ear pain and sore throat.   Respiratory:  Positive for cough. Negative for shortness of breath.   Cardiovascular:  Negative for chest pain and palpitations.   Gastrointestinal:  Negative for diarrhea and vomiting.  Skin:  Negative for color change and rash.  All other systems reviewed and are negative.   Physical Exam Triage Vital Signs ED Triage Vitals  Enc Vitals Group     BP 01/14/21 0904 121/81     Pulse Rate 01/14/21 0904 98     Resp 01/14/21 0904 18     Temp 01/14/21 0904 99.1 F (37.3 C)     Temp Source 01/14/21 0904 Oral     SpO2 01/14/21 0904 95 %     Weight --      Height --      Head Circumference --      Peak Flow --      Pain Score 01/14/21 0915 4     Pain Loc --      Pain Edu? --      Excl. in GC? --    No data found.  Updated Vital Signs BP 121/81 (BP Location: Left Arm)   Pulse 98   Temp 99.1 F (37.3 C) (Oral)   Resp 18   SpO2 95%   Visual Acuity Right Eye Distance:   Left Eye Distance:   Bilateral Distance:    Right Eye Near:   Left Eye Near:    Bilateral Near:     Physical Exam Vitals and nursing note reviewed.  Constitutional:      General: He is not in acute distress.    Appearance: He is well-developed.  HENT:     Head: Normocephalic and atraumatic.     Right Ear: Tympanic membrane normal.     Left Ear: Tympanic membrane normal.     Nose: Rhinorrhea present.     Mouth/Throat:     Mouth: Mucous membranes are moist.     Pharynx: Oropharynx is clear.  Eyes:     Conjunctiva/sclera: Conjunctivae normal.  Cardiovascular:     Rate and Rhythm: Normal rate and regular rhythm.     Heart sounds: Normal heart sounds.  Pulmonary:     Effort: Pulmonary effort is normal. No respiratory distress.     Breath sounds: Normal breath sounds.  Abdominal:     Palpations: Abdomen is soft.     Tenderness: There is no abdominal tenderness.  Musculoskeletal:     Cervical back: Neck supple.  Skin:    General: Skin is warm and dry.  Neurological:     Mental Status: He is alert.  Psychiatric:        Mood and Affect: Mood normal.        Behavior: Behavior normal.     UC Treatments / Results   Labs (all labs ordered are listed, but only abnormal results are displayed) Labs Reviewed  NOVEL CORONAVIRUS,  NAA  POCT INFLUENZA A/B    EKG   Radiology No results found.  Procedures Procedures (including critical care time)  Medications Ordered in UC Medications - No data to display  Initial Impression / Assessment and Plan / UC Course  I have reviewed the triage vital signs and the nursing notes.  Pertinent labs & imaging results that were available during my care of the patient were reviewed by me and considered in my medical decision making (see chart for details).   Viral illness, cough.  Rapid flu negative.  COVID pending.  Instructed patient to self quarantine per CDC guidelines.  Discussed symptomatic treatment including Tylenol or ibuprofen, rest, hydration.  Instructed patient to follow up with PCP if symptoms are not improving.  Patient agrees to plan of care.    Final Clinical Impressions(s) / UC Diagnoses   Final diagnoses:  Viral illness  Cough, unspecified type     Discharge Instructions      Your flu test is negative.  Your COVID test is pending.  You should self quarantine until the test result is back.    Take Tylenol or ibuprofen as needed for fever or discomfort.  Rest and keep yourself hydrated.    Follow-up with your primary care provider if your symptoms are not improving.         ED Prescriptions   None    PDMP not reviewed this encounter.   Sharion Balloon, NP 01/14/21 [phone]

## 2021-01-14 NOTE — Discharge Instructions (Addendum)
Your flu test is negative.  Your COVID test is pending.  You should self quarantine until the test result is back.   ° °Take Tylenol or ibuprofen as needed for fever or discomfort.  Rest and keep yourself hydrated.   ° °Follow-up with your primary care provider if your symptoms are not improving.   ° ° °

## 2021-01-15 LAB — SARS-COV-2, NAA 2 DAY TAT

## 2021-01-15 LAB — NOVEL CORONAVIRUS, NAA: SARS-CoV-2, NAA: NOT DETECTED

## 2021-03-04 ENCOUNTER — Other Ambulatory Visit: Payer: Self-pay | Admitting: Internal Medicine

## 2021-03-04 DIAGNOSIS — R1013 Epigastric pain: Secondary | ICD-10-CM

## 2021-03-04 DIAGNOSIS — K30 Functional dyspepsia: Secondary | ICD-10-CM

## 2021-03-13 ENCOUNTER — Other Ambulatory Visit: Payer: Self-pay

## 2021-03-13 ENCOUNTER — Ambulatory Visit
Admission: RE | Admit: 2021-03-13 | Discharge: 2021-03-13 | Disposition: A | Discharge status: Home / Self Care | Payer: Commercial Managed Care - PPO | Source: Ambulatory Visit | Attending: Internal Medicine | Admitting: Internal Medicine

## 2021-03-13 DIAGNOSIS — R1013 Epigastric pain: Secondary | ICD-10-CM | POA: Insufficient documentation

## 2021-03-13 DIAGNOSIS — K30 Functional dyspepsia: Secondary | ICD-10-CM | POA: Diagnosis present

## 2021-04-17 ENCOUNTER — Ambulatory Visit: Admit: 2021-04-17 | Disposition: A | Discharge status: Still In Hospital | Payer: Commercial Managed Care - PPO

## 2021-04-22 NOTE — Progress Notes (Signed)
04/29/21 8:27 PM   Eric Lane 05-31-1980 [id number]  Referring provider:  Enid Baas, MD [address] [place],  [place] [address] No chief complaint on file.    HPI: Eric Lane is a 41 y.o.male with a personal history of Peyronie's disease, premature ejaculation, and post void dribbling, who presents today for disorder of penis.   He reports today that that he is very concerned about the length of his penis.  He reports over the past year or more, he feels like he is losing penile length.  He denies any trouble with erections.   He also reports that he has maintained weight since college.   PMH: Past Medical History:  Diagnosis Date   Chest pain    Dyslipidemia    Obesity     Surgical History: Past Surgical History:  Procedure Laterality Date   BACK SURGERY     Fusion of L5/S1    EYE SURGERY     LASIK Bilateral 09/20/2014    Home Medications:  Allergies as of 04/23/2021   No Known Allergies      Medication List        Accurate as of April 23, 2021 11:59 PM. If you have any questions, ask your nurse or doctor.          STOP taking these medications    benzonatate 100 MG capsule Commonly known as: TESSALON Stopped by: Vanna Scotland, MD   doxycycline 100 MG capsule Commonly known as: VIBRAMYCIN Stopped by: Vanna Scotland, MD   famotidine 40 MG tablet Commonly known as: PEPCID Stopped by: Vanna Scotland, MD   sertraline 50 MG tablet Commonly known as: Zoloft Stopped by: Vanna Scotland, MD       TAKE these medications    FLUoxetine 10 MG tablet Commonly known as: PROZAC Take 10 mg by mouth daily.   metoprolol tartrate 50 MG tablet Commonly known as: LOPRESSOR Take 1 tablet (50 mg total) by mouth once for 1 dose. Take 2 hr prior to CT scan        Allergies: No Known Allergies  Family History: Family History  Problem Relation Age of Onset   Hypertension Mother    Hyperlipidemia Father    Heart attack  Father 62   Diabetes Maternal Grandmother    COPD Maternal Grandmother    Congestive Heart Failure Maternal Grandmother    Cancer Maternal Grandfather    Alzheimer's disease Paternal Grandmother    Stomach cancer Paternal Grandfather     Social History:  reports that he quit smoking about 16 years ago. His smoking use included cigarettes. He has a 5.00 pack-year smoking history. His smokeless tobacco use includes chew. He reports current alcohol use of about 2.0 standard drinks per week. He reports that he does not use drugs.   Physical Exam: BP (!) 175/87    Pulse 90    Ht 5\' 11"  (1.803 m)    Wt 216 lb (98 kg)    BMI 30.13 kg/m   Constitutional:  Alert and oriented, No acute distress. HEENT: [place] AT, moist mucus membranes.  Trachea midline, no masses. Cardiovascular: No clubbing, cyanosis, or edema. Respiratory: Normal respiratory effort, no increased work of breathing. GU: normal bilateral descended testicle, slight suprapubic fat bad, normal deviation in terms of length  Skin: No rashes, bruises or suspicious lesions. Neurologic: Grossly intact, no focal deficits, moving all 4 extremities. Psychiatric: Normal mood and affect.  Laboratory Data: Lab Results  Component Value Date  CREATININE 1.23 08/11/2020   Lab Results  Component Value Date   TESTOSTERONE 193 (L) 03/30/2018   Lab Results  Component Value Date   HGBA1C 5.3 05/15/2019    Assessment & Plan:   Acquired buried penis  - Exam today reassuring, completely normal - We discussed different treatments for acquired penis. May benefit from weight loss - we discussed vacuum erection device and penile stretching -Erectile dysfunction does not seem to be contributing factor - He was given information about acquired buried penis -I recommended against interventions that he was reading about online including penoplasty, injections of fillers, ligation of suspensory ligament, etc.  We discussed how these things may be very  harmful and not helpful.   Follow-up as needed   Tawni Millers as a scribe for Vanna Scotland, MD.,have documented all relevant documentation on the behalf of Vanna Scotland, MD,as directed by  Vanna Scotland, MD while in the presence of Vanna Scotland, MD.  I have reviewed the above documentation for accuracy and completeness, and I agree with the above.   Vanna Scotland, MD   [facility] [address], [address] [place], [place] [address] [phone]

## 2021-04-23 ENCOUNTER — Encounter: Payer: Self-pay | Admitting: Urology

## 2021-04-23 ENCOUNTER — Other Ambulatory Visit: Payer: Self-pay

## 2021-04-23 ENCOUNTER — Ambulatory Visit: Payer: Commercial Managed Care - PPO | Admitting: Urology

## 2021-04-23 VITALS — BP 175/87 | HR 90 | Ht 71.0 in | Wt 216.0 lb

## 2021-04-23 DIAGNOSIS — N4883 Acquired buried penis: Secondary | ICD-10-CM

## 2021-04-23 DIAGNOSIS — N3943 Post-void dribbling: Secondary | ICD-10-CM

## 2021-04-23 NOTE — Patient Instructions (Signed)
Buried Penis Buried, or hidden, penis can be apparent at birth but also develop later in life. Most common in infants and toddlers, it can occur in males of any age. The condition can be caused by abnormalities in the penis's ligaments, obesity or swelling around the scrotum. Buried penis can lead to physical and psychological problems. Treatment for the condition is usually surgical. APPOINTMENTS & ACCESS CONTACT us Symptoms and Causes Diagnosis and Tests Management and Treatment OVERVIEW What is buried penis? Buried penis is a condition that can affect boys and adult men. In this condition, the penis is of normal size but is hidden under the skin of the abdomen, thigh, or scrotum (sac beneath the penis that holds the testicles).  SYMPTOMS AND CAUSES What causes buried penis? Buried or hidden penis can be present at birth or may develop later in life. It is seen more often in infants and toddlers than in older boys and men.  Some of the most common causes include:  Abnormalities that are present at birth: The ligaments that attach the penis to underlying structures may be weaker than usual. Class III obesity: Excess fat around the abdomen and genital area can make the penis appear to be hidden. Lymphedema: Swelling around the scrotum area due to the collection of lymph fluid may cause the penis to become buried inside tissue. What are some complications related to buried penis? Buried penis often comes along with other physical and psychological problems.  Boys and men may be unable to urinate while standing, or even sitting, without getting drops of urine on the skin of the scrotum or thighs or clothes. Infections in the urinary tract and the genital area are common due to constant moist skin. In uncircumcised men, the skin covering the head of the penis may become inflamed. Men may be unable to get an erection. If they do get an erection, it may be painful and/or may not be able to  penetrate a vagina. Psychological problems linked to buried penis may be present in boys and men, who may have issues like low self-esteem and depression. DIAGNOSIS AND TESTS How is buried penis diagnosed? Usually, a doctor can diagnose the condition with a visual and physical exam.  MANAGEMENT AND TREATMENT How is buried penis treated? Buried penis can be difficult to treat both in children and adults. The treatment depends on the underlying cause. In infants and children; sometimes the condition goes away on its own. If buried penis occurs in adult men or does not resolve on its own in children, surgery may be needed. Each situation is different so no one surgical technique applies to every case. However, types of surgery include:    Medications: A doctor may prescribe drugs if buried penis has caused an infection in the genital region. Weight loss: Patients with obesity usually are encouraged to lose weight before having surgery. Although weight loss alone is not likely to solve the problem, it can make complications during and after surgery less likely. Weight loss and nutritional counseling can help patients before and after surgery. Psychological counseling: Mental health professionals may address depression, sexual dysfunction and low self-esteem.

## 2021-04-24 LAB — MICROSCOPIC EXAMINATION
Bacteria, UA: NONE SEEN
Epithelial Cells (non renal): NONE SEEN /hpf (ref 0–10)
RBC, Urine: NONE SEEN /hpf (ref 0–2)

## 2021-04-24 LAB — URINALYSIS, COMPLETE
Bilirubin, UA: NEGATIVE
Glucose, UA: NEGATIVE
Ketones, UA: NEGATIVE
Leukocytes,UA: NEGATIVE
Nitrite, UA: NEGATIVE
Protein,UA: NEGATIVE
RBC, UA: NEGATIVE
Specific Gravity, UA: >1.03 — ABNORMAL HIGH (ref 1.005–1.030)
Urobilinogen, Ur: 1 mg/dL (ref 0.2–1.0)
pH, UA: 6 (ref 5.0–7.5)

## 2021-10-16 ENCOUNTER — Other Ambulatory Visit (HOSPITAL_COMMUNITY): Payer: Self-pay | Admitting: Internal Medicine

## 2021-10-16 ENCOUNTER — Other Ambulatory Visit: Payer: Self-pay | Admitting: Internal Medicine

## 2021-10-16 DIAGNOSIS — R1084 Generalized abdominal pain: Secondary | ICD-10-CM

## 2021-10-20 ENCOUNTER — Ambulatory Visit
Admission: RE | Admit: 2021-10-20 | Discharge: 2021-10-20 | Disposition: A | Discharge status: Home / Self Care | Payer: Commercial Managed Care - PPO | Source: Ambulatory Visit | Attending: Internal Medicine | Admitting: Internal Medicine

## 2021-10-20 DIAGNOSIS — R1084 Generalized abdominal pain: Secondary | ICD-10-CM | POA: Diagnosis present

## 2021-10-20 MED ORDER — IOHEXOL 300 MG/ML  SOLN
100.0000 mL | Freq: Once | INTRAMUSCULAR | Status: AC | PRN
  Administered 2021-10-20: 100 mL via INTRAVENOUS

## 2023-03-01 ENCOUNTER — Encounter: Payer: Self-pay | Admitting: Student

## 2023-03-01 ENCOUNTER — Other Ambulatory Visit: Payer: Self-pay | Admitting: Student

## 2023-03-01 DIAGNOSIS — M25511 Pain in right shoulder: Secondary | ICD-10-CM

## 2023-03-01 DIAGNOSIS — S46011A Strain of muscle(s) and tendon(s) of the rotator cuff of right shoulder, initial encounter: Secondary | ICD-10-CM

## 2023-03-01 DIAGNOSIS — M7521 Bicipital tendinitis, right shoulder: Secondary | ICD-10-CM

## 2023-03-01 DIAGNOSIS — M7581 Other shoulder lesions, right shoulder: Secondary | ICD-10-CM

## 2023-03-04 ENCOUNTER — Encounter: Payer: Self-pay | Admitting: Student

## 2023-03-13 ENCOUNTER — Other Ambulatory Visit: Payer: Commercial Managed Care - PPO

## 2023-03-16 ENCOUNTER — Ambulatory Visit
Admission: RE | Admit: 2023-03-16 | Discharge: 2023-03-16 | Disposition: A | Discharge status: Home / Self Care | Payer: Commercial Managed Care - PPO | Source: Ambulatory Visit | Attending: Student

## 2023-03-16 DIAGNOSIS — S46011A Strain of muscle(s) and tendon(s) of the rotator cuff of right shoulder, initial encounter: Secondary | ICD-10-CM

## 2023-03-16 DIAGNOSIS — M7581 Other shoulder lesions, right shoulder: Secondary | ICD-10-CM

## 2023-03-16 DIAGNOSIS — M25511 Pain in right shoulder: Secondary | ICD-10-CM

## 2023-03-16 DIAGNOSIS — M7521 Bicipital tendinitis, right shoulder: Secondary | ICD-10-CM

## 2023-07-09 ENCOUNTER — Ambulatory Visit: Payer: Self-pay

## 2023-07-09 DIAGNOSIS — K2100 Gastro-esophageal reflux disease with esophagitis, without bleeding: Secondary | ICD-10-CM

## 2023-07-09 DIAGNOSIS — R140 Abdominal distension (gaseous): Secondary | ICD-10-CM
